# Patient Record
Sex: Female | Born: 1955
Health system: Southern US, Community
[De-identification: ages and names within clinical notes are randomized; demographics above are authoritative.]

## PROBLEM LIST (undated history)

## (undated) DIAGNOSIS — K219 Gastro-esophageal reflux disease without esophagitis: Secondary | ICD-10-CM

## (undated) DIAGNOSIS — E213 Hyperparathyroidism, unspecified: Secondary | ICD-10-CM

## (undated) DIAGNOSIS — R011 Cardiac murmur, unspecified: Secondary | ICD-10-CM

## (undated) DIAGNOSIS — H269 Unspecified cataract: Secondary | ICD-10-CM

## (undated) DIAGNOSIS — E559 Vitamin D deficiency, unspecified: Secondary | ICD-10-CM

## (undated) DIAGNOSIS — D649 Anemia, unspecified: Secondary | ICD-10-CM

## (undated) DIAGNOSIS — E669 Obesity, unspecified: Secondary | ICD-10-CM

## (undated) DIAGNOSIS — I10 Essential (primary) hypertension: Secondary | ICD-10-CM

## (undated) DIAGNOSIS — M1990 Unspecified osteoarthritis, unspecified site: Secondary | ICD-10-CM

## (undated) DIAGNOSIS — D509 Iron deficiency anemia, unspecified: Secondary | ICD-10-CM

## (undated) HISTORY — PX: TONSILLECTOMY: SUR1361

## (undated) HISTORY — DX: Unspecified cataract: H26.9

## (undated) HISTORY — PX: ABDOMINAL HYSTERECTOMY: SHX81

---

## 2000-02-02 ENCOUNTER — Ambulatory Visit (HOSPITAL_COMMUNITY): Admission: RE | Admit: 2000-02-02 | Discharge: 2000-02-02 | Payer: Self-pay | Admitting: Psychiatry

## 2004-12-24 ENCOUNTER — Ambulatory Visit: Payer: Self-pay

## 2005-12-16 ENCOUNTER — Other Ambulatory Visit: Admission: RE | Admit: 2005-12-16 | Discharge: 2005-12-16 | Payer: Self-pay | Admitting: Obstetrics and Gynecology

## 2007-11-10 ENCOUNTER — Encounter: Admission: RE | Admit: 2007-11-10 | Discharge: 2007-11-10 | Payer: Self-pay | Admitting: Obstetrics & Gynecology

## 2007-12-22 ENCOUNTER — Encounter: Admission: RE | Admit: 2007-12-22 | Discharge: 2007-12-22 | Payer: Self-pay | Admitting: Obstetrics & Gynecology

## 2018-11-13 ENCOUNTER — Ambulatory Visit: Payer: Self-pay | Admitting: Family Medicine

## 2018-11-21 DIAGNOSIS — H524 Presbyopia: Secondary | ICD-10-CM | POA: Diagnosis not present

## 2018-12-18 ENCOUNTER — Ambulatory Visit (INDEPENDENT_AMBULATORY_CARE_PROVIDER_SITE_OTHER): Payer: Medicare HMO | Admitting: Family Medicine

## 2018-12-18 ENCOUNTER — Encounter (INDEPENDENT_AMBULATORY_CARE_PROVIDER_SITE_OTHER): Payer: Self-pay

## 2018-12-18 ENCOUNTER — Encounter: Payer: Self-pay | Admitting: Family Medicine

## 2018-12-18 VITALS — BP 128/76 | HR 92 | Temp 98.1°F | Resp 12 | Ht 62.5 in | Wt 209.7 lb

## 2018-12-18 DIAGNOSIS — D649 Anemia, unspecified: Secondary | ICD-10-CM

## 2018-12-18 DIAGNOSIS — E669 Obesity, unspecified: Secondary | ICD-10-CM | POA: Diagnosis not present

## 2018-12-18 DIAGNOSIS — Z1211 Encounter for screening for malignant neoplasm of colon: Secondary | ICD-10-CM | POA: Diagnosis not present

## 2018-12-18 DIAGNOSIS — Z8342 Family history of familial hypercholesterolemia: Secondary | ICD-10-CM | POA: Diagnosis not present

## 2018-12-18 DIAGNOSIS — D509 Iron deficiency anemia, unspecified: Secondary | ICD-10-CM | POA: Diagnosis not present

## 2018-12-18 DIAGNOSIS — Z1239 Encounter for other screening for malignant neoplasm of breast: Secondary | ICD-10-CM

## 2018-12-18 DIAGNOSIS — Z5181 Encounter for therapeutic drug level monitoring: Secondary | ICD-10-CM

## 2018-12-18 DIAGNOSIS — E2839 Other primary ovarian failure: Secondary | ICD-10-CM

## 2018-12-18 DIAGNOSIS — Z818 Family history of other mental and behavioral disorders: Secondary | ICD-10-CM

## 2018-12-18 MED ORDER — ASPIRIN EC 81 MG PO TBEC: 81.0000 mg | DELAYED_RELEASE_TABLET | Freq: Every day | ORAL | Status: DC

## 2018-12-18 NOTE — Assessment & Plan Note (Signed)
Check lipids 

## 2018-12-18 NOTE — Progress Notes (Signed)
BP 128/76   Pulse 92   Temp 98.1 F (36.7 C) (Oral)   Resp 12   Ht 5' 2.5" (1.588 m)   Wt 209 lb 11.2 oz (95.1 kg)   SpO2 96%   BMI 37.74 kg/m    Subjective:    Patient ID: Danielle Le, female    DOB: Oct 03, 1956, 63 y.o.   MRN: [id number]  HPI: Danielle Le is a 63 y.o. female  Chief Complaint  Patient presents with  . Establish Care    HPI Patient is here to establish care  Mother had high cholesterol No recent labs; never took cholesterol medicine; does eat "not so good" Never had issues with blood pressure or elevated sugars Dementia in the family; PGF and father and cousin with dementia; nothing major issues with memory Cologuard requested; no fam hx of colon cancer; no hx of colon polyps personally DEXA ordered, no recent bone densities; gets calcium sources Mammogram ordered, no lumps or bumps; no hx of biopsies Obesity; struggled for ten years; not really tried anything; she says she can do it   Depression screen [facility] 2/9 12/18/2018  Decreased Interest 0  Down, Depressed, Hopeless 0  PHQ - 2 Score 0  Altered sleeping 0  Tired, decreased energy 0  Change in appetite 0  Feeling bad or failure about yourself  0  Trouble concentrating 0  Moving slowly or fidgety/restless 0  Suicidal thoughts 0  PHQ-9 Score 0  Difficult doing work/chores Not difficult at all   Fall Risk  12/18/2018  Falls in the past year? 1  Number falls in past yr: 0  Injury with Fall? 1  MD note: took out the trash wearing flip flops; hurt her left wrist, swelled up and saw walk-in clinic, xrayed; did not break it  Relevant past medical, surgical, family and social history reviewed Past Medical History:  Diagnosis Date  . Cataract    Past Surgical History:  Procedure Laterality Date  . ABDOMINAL HYSTERECTOMY    . CESAREAN SECTION       Family History  Problem Relation Age of Onset  . Hypertension Mother   . Hyperlipidemia Mother   . Dementia Father   . Hypertension Father     . AVM Brother   . Anxiety disorder Son   . Seizures Son   . Dementia Paternal Grandfather   . Hypertension Brother   MD note: brother has a pressure ulcer (sacral), lots of health issues; two years old than her  Social History   Tobacco Use  . Smoking status: Never Smoker  . Smokeless tobacco: Never Used  Substance Use Topics  . Alcohol use: Not Currently  . Drug use: Not Currently     [facility] Visit from 12/18/2018 in [facility]  AUDIT-C Score  0      Interim medical history since last visit reviewed. Allergies and medications reviewed  Review of Systems Per HPI unless specifically indicated above     Objective:    BP 128/76   Pulse 92   Temp 98.1 F (36.7 C) (Oral)   Resp 12   Ht 5' 2.5" (1.588 m)   Wt 209 lb 11.2 oz (95.1 kg)   SpO2 96%   BMI 37.74 kg/m   Wt Readings from Last 3 Encounters:  12/25/18 209 lb 12.8 oz (95.2 kg)  12/18/18 209 lb 11.2 oz (95.1 kg)    Physical Exam Constitutional:      General: She is not in  acute distress.    Appearance: She is well-developed. She is obese. She is not diaphoretic.  HENT:     Head: Normocephalic and atraumatic.  Eyes:     General: No scleral icterus. Neck:     Thyroid: No thyromegaly.  Cardiovascular:     Rate and Rhythm: Normal rate and regular rhythm.     Heart sounds: Normal heart sounds. No murmur.  Pulmonary:     Effort: Pulmonary effort is normal. No respiratory distress.     Breath sounds: Normal breath sounds. No wheezing.  Abdominal:     General: Bowel sounds are normal. There is no distension.     Palpations: Abdomen is soft.  Skin:    General: Skin is warm and dry.     Coloration: Skin is not pale.  Neurological:     Mental Status: She is alert.  Psychiatric:        Behavior: Behavior normal.        Thought Content: Thought content normal.        Judgment: Judgment normal.        Assessment & Plan:   Problem List Items Addressed This Visit      Other    Obesity (BMI 35.0-39.9 without comorbidity) - Primary    Encouraged weight loss; see AVS      Relevant Orders   COMPLETE METABOLIC PANEL WITH GFR (Completed)   Lipid panel (Completed)   TSH (Completed)   Family history of high cholesterol    Check lipids      Family history of dementia    No issues currently       Other Visit Diagnoses    Screening for breast cancer       Relevant Orders   MM 3D SCREEN BREAST BILATERAL   Screen for colon cancer       Relevant Orders   Cologuard   Estrogen deficiency       Relevant Orders   DG Bone Density   Medication monitoring encounter       Relevant Orders   CBC       Follow up plan: Return in about 1 month (around 01/16/2019) for Medicare Wellness visit with Dr. Sanda Klein.  An after-visit summary was printed and given to the patient at [facility].  Please see the patient instructions which may contain other information and recommendations beyond what is mentioned above in the assessment and plan.  Meds ordered this encounter  Medications  . aspirin EC 81 MG tablet    Sig: Take 1 tablet (81 mg total) by mouth daily.    Orders Placed This Encounter  Procedures  . DG Bone Density  . MM 3D SCREEN BREAST BILATERAL  . Cologuard  . COMPLETE METABOLIC PANEL WITH GFR  . Lipid panel  . TSH  . CBC  . CBC  . Pathologist smear review  . TEST AUTHORIZATION  . Iron, TIBC and Ferritin Panel    Face-to-face time with patient was more than 30 minutes, >50% time spent counseling and coordination of care

## 2018-12-18 NOTE — Assessment & Plan Note (Signed)
No issues currently

## 2018-12-18 NOTE — Assessment & Plan Note (Signed)
Encouraged weight loss; see AVS 

## 2018-12-18 NOTE — Patient Instructions (Addendum)
We'll get labs today If you have not heard anything from my staff in a week about any orders/referrals/studies from today, please contact us here to follow-up [phone]  Please do call to schedule your mammogram and bone density scan; the number to schedule one at either [facility] or [facility] is [phone]  Return for a Welcome to Medicare visit with Dr. Sanda Klein  Check out the information at familydoctor.org entitled "Nutrition for Weight Loss: What You Need to Know about Fad Diets" Try to lose between 1-2 pounds per week by taking in fewer calories and burning off more calories You can succeed by limiting portions, limiting foods dense in calories and fat, becoming more active, and drinking 8 glasses of water a day (64 ounces) Don't skip meals, especially breakfast, as skipping meals may alter your metabolism Do not use over-the-counter weight loss pills or gimmicks that claim rapid weight loss A healthy BMI (or body mass index) is between 18.5 and 24.9 You can calculate your ideal BMI at the [place] website ClubMonetize.fr   Obesity, Adult Obesity is the condition of having too much total body fat. Being overweight or obese means that your weight is greater than what is considered healthy for your body size. Obesity is determined by a measurement called BMI. BMI is an estimate of body fat and is calculated from height and weight. For adults, a BMI of 30 or higher is considered obese. Obesity can eventually lead to other health concerns and major illnesses, including:  Stroke.  Coronary artery disease (CAD).  Type 2 diabetes.  Some types of cancer, including cancers of the colon, breast, uterus, and gallbladder.  Osteoarthritis.  High blood pressure (hypertension).  High cholesterol.  Sleep apnea.  Gallbladder stones.  Infertility problems. What are the causes? The main cause of obesity is  taking in (consuming) more calories than your body uses for energy. Other factors that contribute to this condition may include:  Being born with genes that make you more likely to become obese.  Having a medical condition that causes obesity. These conditions include: ? Hypothyroidism. ? Polycystic ovarian syndrome (PCOS). ? Binge-eating disorder. ? Cushing syndrome.  Taking certain medicines, such as steroids, antidepressants, and seizure medicines.  Not being physically active (sedentary lifestyle).  Living where there are limited places to exercise safely or buy healthy foods.  Not getting enough sleep. What increases the risk? The following factors may increase your risk of this condition:  Having a family history of obesity.  Being a woman of African-American descent.  Being a man of Hispanic descent. What are the signs or symptoms? Having excessive body fat is the main symptom of this condition. How is this diagnosed? This condition may be diagnosed based on:  Your symptoms.  Your medical history.  A physical exam. Your health care provider may measure: ? Your BMI. If you are an adult with a BMI between 25 and less than 30, you are considered overweight. If you are an adult with a BMI of 30 or higher, you are considered obese. ? The distances around your hips and your waist (circumferences). These may be compared to each other to help diagnose your condition. ? Your skinfold thickness. Your health care provider may gently pinch a fold of your skin and measure it. How is this treated? Treatment for this condition often includes changing your lifestyle. Treatment may include some or all of the following:  Dietary changes. Work with your health care provider and  a dietitian to set a weight-loss goal that is healthy and reasonable for you. Dietary changes may include eating: ? Smaller portions. A portion size is the amount of a particular food that is healthy for you to  eat at one time. This varies from person to person. ? Low-calorie or low-fat options. ? More whole grains, fruits, and vegetables.  Regular physical activity. This may include aerobic activity (cardio) and strength training.  Medicine to help you lose weight. Your health care provider may prescribe medicine if you are unable to lose 1 pound a week after 6 weeks of eating more healthily and doing more physical activity.  Surgery. Surgical options may include gastric banding and gastric bypass. Surgery may be done if: ? Other treatments have not helped to improve your condition. ? You have a BMI of 40 or higher. ? You have life-threatening health problems related to obesity. Follow these instructions at home:  Eating and drinking   Follow recommendations from your health care provider about what you eat and drink. Your health care provider may advise you to: ? Limit fast foods, sweets, and processed snack foods. ? Choose low-fat options, such as low-fat milk instead of whole milk. ? Eat 5 or more servings of fruits or vegetables every day. ? Eat at home more often. This gives you more control over what you eat. ? Choose healthy foods when you eat out. ? Learn what a healthy portion size is. ? Keep low-fat snacks on hand. ? Avoid sugary drinks, such as soda, fruit juice, iced tea sweetened with sugar, and flavored milk. ? Eat a healthy breakfast.  Drink enough water to keep your urine clear or pale yellow.  Do not go without eating for long periods of time (do not fast) or follow a fad diet. Fasting and fad diets can be unhealthy and even dangerous. Physical Activity  Exercise regularly, as told by your health care provider. Ask your health care provider what types of exercise are safe for you and how often you should exercise.  Warm up and stretch before being active.  Cool down and stretch after being active.  Rest between periods of activity. Lifestyle  Limit the time that  you spend in front of your TV, computer, or video game system.  Find ways to reward yourself that do not involve food.  Limit alcohol intake to no more than 1 drink a day for nonpregnant women and 2 drinks a day for men. One drink equals 12 oz of beer, 5 oz of wine, or 1 oz of hard liquor. General instructions  Keep a weight loss journal to keep track of the food you eat and how much you exercise you get.  Take over-the-counter and prescription medicines only as told by your health care provider.  Take vitamins and supplements only as told by your health care provider.  Consider joining a support group. Your health care provider may be able to recommend a support group.  Keep all follow-up visits as told by your health care provider. This is important. Contact a health care provider if:  You are unable to meet your weight loss goal after 6 weeks of dietary and lifestyle changes. This information is not intended to replace advice given to you by your health care provider. Make sure you discuss any questions you have with your health care provider. Document Released: 11/25/2004 Document Revised: 03/22/2016 Document Reviewed: 08/06/2015 Elsevier Interactive Patient Education  2019 [employer].

## 2018-12-19 ENCOUNTER — Telehealth: Payer: Self-pay | Admitting: Family Medicine

## 2018-12-19 DIAGNOSIS — D509 Iron deficiency anemia, unspecified: Secondary | ICD-10-CM

## 2018-12-19 NOTE — Telephone Encounter (Signed)
Called pt to review labs; left detailed message Anemic; if [facility], [facility], call 911, get to ER right away She may not be symptomatic because this appears chronic and she may not realize if slow insidious onset I'll refer to GI and add on extra labs to blood at [facility] ---------------------------------------- Roselyn Reef, please ADD ON iron studies I already put in referral to GI

## 2018-12-20 ENCOUNTER — Other Ambulatory Visit: Payer: Self-pay

## 2018-12-20 ENCOUNTER — Telehealth: Payer: Self-pay | Admitting: Family Medicine

## 2018-12-20 DIAGNOSIS — D509 Iron deficiency anemia, unspecified: Secondary | ICD-10-CM

## 2018-12-20 NOTE — Telephone Encounter (Signed)
Charted in result notes. 

## 2018-12-20 NOTE — Telephone Encounter (Signed)
Given to lab to add

## 2018-12-20 NOTE — Telephone Encounter (Signed)
Copied from [place] [id number]. Topic: Quick Communication - Lab Results (Clinic Use ONLY) >> Dec 20, 2018  9:14 AM Cathrine Muster, CMA wrote: Called patient to inform them of  lab results. When patient returns call, triage nurse may disclose results. >> Dec 20, 2018  3:39 PM Bea Graff, NT wrote: Pt calling back to receive lab results.

## 2018-12-22 LAB — CBC
HCT: 30 % — ABNORMAL LOW (ref 35.0–45.0)
Hemoglobin: 9 g/dL — ABNORMAL LOW (ref 11.7–15.5)
MCH: 19.3 pg — AB (ref 27.0–33.0)
MCHC: 30 g/dL — AB (ref 32.0–36.0)
MCV: 64.4 fL — AB (ref 80.0–100.0)
MPV: 10.4 fL (ref 7.5–12.5)
PLATELETS: 335 10*3/uL (ref 140–400)
RBC: 4.66 10*6/uL (ref 3.80–5.10)
RDW: 17 % — ABNORMAL HIGH (ref 11.0–15.0)
WBC: 4.2 10*3/uL (ref 3.8–10.8)

## 2018-12-22 LAB — COMPLETE METABOLIC PANEL WITH GFR
AG Ratio: 1.4 (calc) (ref 1.0–2.5)
ALKALINE PHOSPHATASE (APISO): 113 U/L (ref 37–153)
ALT: 15 U/L (ref 6–29)
AST: 19 U/L (ref 10–35)
Albumin: 4.2 g/dL (ref 3.6–5.1)
BUN: 10 mg/dL (ref 7–25)
CALCIUM: 10.4 mg/dL (ref 8.6–10.4)
CO2: 28 mmol/L (ref 20–32)
Chloride: 104 mmol/L (ref 98–110)
Creat: 0.64 mg/dL (ref 0.50–0.99)
GFR, Est African American: 109 mL/min/{1.73_m2} (ref >=60)
GFR, Est Non African American: 94 mL/min/{1.73_m2} (ref >=60)
GLOBULIN: 2.9 g/dL (ref 1.9–3.7)
GLUCOSE: 88 mg/dL (ref 65–99)
Potassium: 4.1 mmol/L (ref 3.5–5.3)
SODIUM: 138 mmol/L (ref 135–146)
Total Bilirubin: 0.4 mg/dL (ref 0.2–1.2)
Total Protein: 7.1 g/dL (ref 6.1–8.1)

## 2018-12-22 LAB — LIPID PANEL
CHOL/HDL RATIO: 5.2 (calc) — AB (ref <5.0)
CHOLESTEROL: 206 mg/dL — AB (ref <200)
HDL: 40 mg/dL — AB (ref >=50)
LDL Cholesterol (Calc): 130 mg/dL (calc) — ABNORMAL HIGH
Non-HDL Cholesterol (Calc): 166 mg/dL (calc) — ABNORMAL HIGH (ref <130)
TRIGLYCERIDES: 215 mg/dL — AB (ref <150)

## 2018-12-22 LAB — TEST AUTHORIZATION

## 2018-12-22 LAB — IRON,TIBC AND FERRITIN PANEL
%SAT: 4 % (calc) — ABNORMAL LOW (ref 16–45)
FERRITIN: 4 ng/mL — AB (ref 16–288)
IRON: 18 ug/dL — AB (ref 45–160)
TIBC: 453 mcg/dL (calc) — ABNORMAL HIGH (ref 250–450)

## 2018-12-22 LAB — PATHOLOGIST SMEAR REVIEW

## 2018-12-22 LAB — TSH: TSH: 1.96 m[IU]/L (ref 0.40–4.50)

## 2018-12-25 ENCOUNTER — Other Ambulatory Visit: Payer: Self-pay

## 2018-12-25 ENCOUNTER — Ambulatory Visit: Payer: Medicare HMO | Admitting: Gastroenterology

## 2018-12-25 ENCOUNTER — Encounter: Payer: Self-pay | Admitting: Gastroenterology

## 2018-12-25 ENCOUNTER — Telehealth: Payer: Self-pay | Admitting: Family Medicine

## 2018-12-25 ENCOUNTER — Encounter

## 2018-12-25 ENCOUNTER — Encounter: Payer: Self-pay | Admitting: Family Medicine

## 2018-12-25 VITALS — BP 152/81 | HR 66 | Resp 16 | Ht 62.5 in | Wt 209.8 lb

## 2018-12-25 DIAGNOSIS — D509 Iron deficiency anemia, unspecified: Secondary | ICD-10-CM

## 2018-12-25 NOTE — Telephone Encounter (Signed)
I called to talk to patient; left msg on cell phone and home phone She saw specialist today Unless told otherwise, stop the aspirin for now while the anemia is being worked up [employer] be available at the office all week if she wants to chat

## 2018-12-25 NOTE — Progress Notes (Signed)
Cephas Darby, MD [address]  [place]  [place], [place] [address]  Main: [phone]  Fax: [phone]    Gastroenterology Consultation  Referring Provider:     Arnetha Courser, MD Primary Care Physician:  Arnetha Courser, MD Primary Gastroenterologist:  Dr. Cephas Darby Reason for Consultation:     Severe iron deficiency anemia        HPI:   Danielle Le is a 63 y.o. female referred by Dr. Sanda Klein, Satira Anis, MD  for consultation & management of severe iron deficiency anemia.  Patient was seen by Dr. Sanda Klein for a new patient appointment and physical on 12/18/2018.  She did not have any GI symptoms at that time.  On routine blood work, she was found to have severe iron deficiency anemia, hemoglobin 9, MCV 64.4, ferritin 4, TIBC 453, low iron 18.  TSH was normal, dyspnea review revealed hypochromic, microcytic anemia and mild lymphocytosis.  Patient reports that she has been feeling tired for last several months and she attributed it to taking care of 51 year old son who is completely dependent on her as a newborn.  She denies change in stool caliber, altered bowel habits, melena, hematochezia.  Patient had a complete total hysterectomy.  She denies gross blood in urine.  NSAIDs: None  Antiplts/Anticoagulants/Anti thrombotics: Aspirin 81  GI Procedures: None She denies family history of GI malignancy Brother with AVM of the spine  Past Medical History:  Diagnosis Date  . Cataract       Current Outpatient Medications:  .  aspirin EC 81 MG tablet, Take 1 tablet (81 mg total) by mouth daily., Disp: , Rfl:     Family History  Problem Relation Age of Onset  . Hypertension Mother   . Hyperlipidemia Mother   . Dementia Father   . Hypertension Father   . AVM Brother   . Anxiety disorder Son   . Seizures Son   . Dementia Paternal Grandfather   . Hypertension Brother      Social History   Tobacco Use  . Smoking status: Never Smoker  . Smokeless tobacco: Never  Used  Substance Use Topics  . Alcohol use: Not Currently  . Drug use: Not Currently    Allergies as of 12/25/2018  . (No Known Allergies)    Review of Systems:    All systems reviewed and negative except where noted in HPI.   Physical Exam:  BP (!) 152/81 (BP Location: Left Arm, Patient Position: Sitting, Cuff Size: Large)   Pulse 66   Resp 16   Ht 5' 2.5" (1.588 m)   Wt 209 lb 12.8 oz (95.2 kg)   BMI 37.76 kg/m  No LMP recorded. Patient has had a hysterectomy.  General:   Alert,  Well-developed, well-nourished, pleasant and cooperative in NAD Head:  Normocephalic and atraumatic. Eyes:  Sclera clear, no icterus.   Conjunctiva pink. Ears:  Normal auditory acuity. Nose:  No deformity, discharge, or lesions. Mouth:  No deformity or lesions,oropharynx pink & moist. Neck:  Supple; no masses or thyromegaly. Lungs:  Respirations even and unlabored.  Clear throughout to auscultation.   No wheezes, crackles, or rhonchi. No acute distress. Heart:  Regular rate and rhythm; no murmurs, clicks, rubs, or gallops. Abdomen:  Normal bowel sounds. Soft, non-tender and non-distended without masses, hepatosplenomegaly or hernias noted.  No guarding or rebound tenderness.   Rectal: Not performed Msk:  Symmetrical without gross deformities. Good, equal movement & strength bilaterally. Pulses:  Normal pulses noted. Extremities:  No clubbing or edema.  No cyanosis. Neurologic:  Alert and oriented x3;  grossly normal neurologically. Skin:  Intact without significant lesions or rashes. No jaundice. Psych:  Alert and cooperative. Normal mood and affect.  Imaging Studies: No abdominal imaging  Assessment and Plan:   Danielle Le is a 63 y.o. pleasant Caucasian female with no past medical history, complete total hysterectomy found to have severe iron deficiency anemia on routine labs during physical.  She does not have signs of active GI bleed  Iron deficiency anemia Recommend EGD and colonoscopy  +/- VCE Start fusion plus, samples provided Check B12 and folate levels   Follow up in 4 weeks   Cephas Darby, MD

## 2018-12-26 LAB — SPECIMEN STATUS REPORT

## 2018-12-26 LAB — B12 AND FOLATE PANEL
FOLATE: 12.4 ng/mL (ref >3.0)
VITAMIN B 12: 1291 pg/mL — AB (ref 232–1245)

## 2019-01-04 ENCOUNTER — Ambulatory Visit
Admission: RE | Admit: 2019-01-04 | Discharge: 2019-01-04 | Disposition: A | Discharge status: Home / Self Care | Payer: Medicare HMO | Attending: Gastroenterology | Admitting: Gastroenterology

## 2019-01-04 ENCOUNTER — Other Ambulatory Visit: Payer: Self-pay

## 2019-01-04 ENCOUNTER — Ambulatory Visit: Payer: Medicare HMO | Admitting: Anesthesiology

## 2019-01-04 ENCOUNTER — Encounter: Payer: Self-pay | Admitting: Student

## 2019-01-04 ENCOUNTER — Encounter
Admission: RE | Disposition: A | Discharge status: Home / Self Care | Payer: Self-pay | Source: Home / Self Care | Attending: Gastroenterology

## 2019-01-04 DIAGNOSIS — K2950 Unspecified chronic gastritis without bleeding: Secondary | ICD-10-CM | POA: Diagnosis not present

## 2019-01-04 DIAGNOSIS — D509 Iron deficiency anemia, unspecified: Secondary | ICD-10-CM | POA: Insufficient documentation

## 2019-01-04 DIAGNOSIS — K2960 Other gastritis without bleeding: Secondary | ICD-10-CM | POA: Diagnosis not present

## 2019-01-04 DIAGNOSIS — Z6837 Body mass index (BMI) 37.0-37.9, adult: Secondary | ICD-10-CM | POA: Insufficient documentation

## 2019-01-04 DIAGNOSIS — E669 Obesity, unspecified: Secondary | ICD-10-CM | POA: Insufficient documentation

## 2019-01-04 DIAGNOSIS — K319 Disease of stomach and duodenum, unspecified: Secondary | ICD-10-CM | POA: Diagnosis not present

## 2019-01-04 DIAGNOSIS — D128 Benign neoplasm of rectum: Secondary | ICD-10-CM | POA: Diagnosis not present

## 2019-01-04 DIAGNOSIS — K3189 Other diseases of stomach and duodenum: Secondary | ICD-10-CM | POA: Diagnosis not present

## 2019-01-04 DIAGNOSIS — K449 Diaphragmatic hernia without obstruction or gangrene: Secondary | ICD-10-CM | POA: Insufficient documentation

## 2019-01-04 DIAGNOSIS — K635 Polyp of colon: Secondary | ICD-10-CM | POA: Diagnosis not present

## 2019-01-04 DIAGNOSIS — D126 Benign neoplasm of colon, unspecified: Secondary | ICD-10-CM | POA: Diagnosis not present

## 2019-01-04 HISTORY — DX: Anemia, unspecified: D64.9

## 2019-01-04 HISTORY — PX: ESOPHAGOGASTRODUODENOSCOPY (EGD) WITH PROPOFOL: SHX5813

## 2019-01-04 HISTORY — PX: COLONOSCOPY WITH PROPOFOL: SHX5780

## 2019-01-04 SURGERY — ESOPHAGOGASTRODUODENOSCOPY (EGD) WITH PROPOFOL
Anesthesia: General

## 2019-01-04 MED ORDER — FENTANYL CITRATE (PF) 100 MCG/2ML IJ SOLN
INTRAMUSCULAR | Status: DC | PRN
  Administered 2019-01-04 (×2): 50 ug via INTRAVENOUS

## 2019-01-04 MED ORDER — FENTANYL CITRATE (PF) 100 MCG/2ML IJ SOLN
INTRAMUSCULAR | Status: AC
  Filled 2019-01-04: qty 2

## 2019-01-04 MED ORDER — LIDOCAINE HCL (CARDIAC) PF 100 MG/5ML IV SOSY
PREFILLED_SYRINGE | INTRAVENOUS | Status: DC | PRN
  Administered 2019-01-04: 80 mg via INTRAVENOUS

## 2019-01-04 MED ORDER — FUSION PLUS PO CAPS: ORAL_CAPSULE | ORAL | 2 refills | Status: DC

## 2019-01-04 MED ORDER — PROPOFOL 10 MG/ML IV BOLUS
INTRAVENOUS | Status: AC
  Filled 2019-01-04: qty 40

## 2019-01-04 MED ORDER — PROPOFOL 500 MG/50ML IV EMUL
INTRAVENOUS | Status: DC | PRN
  Administered 2019-01-04: 100 ug/kg/min via INTRAVENOUS

## 2019-01-04 MED ORDER — SODIUM CHLORIDE 0.9 % IV SOLN
INTRAVENOUS | Status: DC
  Administered 2019-01-04: 1000 mL via INTRAVENOUS

## 2019-01-04 MED ORDER — PROPOFOL 10 MG/ML IV BOLUS
INTRAVENOUS | Status: DC | PRN
  Administered 2019-01-04 (×3): 50 mg via INTRAVENOUS

## 2019-01-04 MED ORDER — LIDOCAINE HCL (PF) 1 % IJ SOLN
INTRAMUSCULAR | Status: AC
  Administered 2019-01-04: 0.3 mL
  Filled 2019-01-04: qty 2

## 2019-01-04 NOTE — Op Note (Signed)
[facility]enterology Patient Name: Danielle Le Procedure Date: 01/04/2019 8:55 AM MRN: [id number] Account #: [id number] Date of Birth: 1956-07-09 Admit Type: Outpatient Age: 63 Room: [facility] ENDO ROOM 2 Gender: Female Note Status: Finalized Procedure:            Colonoscopy Indications:          Unexplained iron deficiency anemia Providers:            Lin Landsman MD, MD Medicines:            Monitored Anesthesia Care Complications:        No immediate complications. Estimated blood loss: None. Procedure:            Pre-Anesthesia Assessment:                       - Prior to the procedure, a History and Physical was                        performed, and patient medications and allergies were                        reviewed. The patient is competent. The risks and                        benefits of the procedure and the sedation options and                        risks were discussed with the patient. All questions                        were answered and informed consent was obtained.                        Patient identification and proposed procedure were                        verified by the physician, the nurse, the                        anesthesiologist, the anesthetist and the technician in                        the pre-procedure area in the procedure room in the                        endoscopy suite. Mental Status Examination: alert and                        oriented. Airway Examination: normal oropharyngeal                        airway and neck mobility. Respiratory Examination:                        clear to auscultation. CV Examination: normal.                        Prophylactic Antibiotics: The patient does not require  prophylactic antibiotics. Prior Anticoagulants: The                        patient has taken no previous anticoagulant or                        antiplatelet agents. ASA Grade Assessment: II - A                     patient with mild systemic disease. After reviewing the                        risks and benefits, the patient was deemed in                        satisfactory condition to undergo the procedure. The                        anesthesia plan was to use monitored anesthesia care                        (MAC). Immediately prior to administration of                        medications, the patient was re-assessed for adequacy                        to receive sedatives. The heart rate, respiratory rate,                        oxygen saturations, blood pressure, adequacy of                        pulmonary ventilation, and response to care were                        monitored throughout the procedure. The physical status                        of the patient was re-assessed after the procedure.                       After obtaining informed consent, the colonoscope was                        passed under direct vision. Throughout the procedure,                        the patient's blood pressure, pulse, and oxygen                        saturations were monitored continuously. The                        Colonoscope was introduced through the anus and                        advanced to the the terminal ileum, with identification                        of the appendiceal  orifice and IC valve. The                        colonoscopy was performed without difficulty. The                        patient tolerated the procedure well. The quality of                        the bowel preparation was evaluated using the BBPS                        [facility] Bowel Preparation Scale) with scores of: Right                        Colon = 3, Transverse Colon = 3 and Left Colon = 3                        (entire mucosa seen well with no residual staining,                        small fragments of stool or opaque liquid). The total                        BBPS score equals 9. Findings:      The  perianal and digital rectal examinations were normal. Pertinent       negatives include normal sphincter tone and no palpable rectal lesions.      The terminal ileum appeared normal examined to 10cm.      The colon (entire examined portion) appeared normal.      A diminutive polyp was found in the rectum. The polyp was sessile,       likely hyperplastic. The polyp was removed with a cold snare. Resection       and retrieval were complete. Impression:           - The examined portion of the ileum was normal.                       - The entire examined colon is normal.                       - One diminutive polyp in the rectum, removed with a                        cold snare. Resected and retrieved. Recommendation:       - Discharge patient to home (with escort).                       - Resume previous diet today.                       - Continue present medications.                       - Await pathology results.                       - Repeat colonoscopy in 10 years for surveillance.                       -  Return to my office as previously scheduled.                       - Continue oral iron                       - VCE if pathology unremarkable Procedure Code(s):    --- Professional ---                       [phone], Colonoscopy, flexible; with removal of tumor(s),                        polyp(s), or other lesion(s) by snare technique Diagnosis Code(s):    --- Professional ---                       K62.1, Rectal polyp                       D50.9, Iron deficiency anemia, unspecified CPT copyright 2018 American Medical Association. All rights reserved. The codes documented in this report are preliminary and upon coder review may  be revised to meet current compliance requirements. Dr. Ulyess Mort Lin Landsman MD, MD 01/04/2019 9:39:20 AM This report has been signed electronically. Number of Addenda: 0 Note Initiated On: 01/04/2019 8:55 AM Scope Withdrawal Time: 0 hours 13 minutes  6 seconds  Total Procedure Duration: 0 hours 14 minutes 40 seconds       [facility]

## 2019-01-04 NOTE — Anesthesia Postprocedure Evaluation (Signed)
Anesthesia Post Note  Patient: Danielle Le  Procedure(s) Performed: ESOPHAGOGASTRODUODENOSCOPY (EGD) WITH PROPOFOL (N/A ) COLONOSCOPY WITH PROPOFOL (N/A )  Patient location during evaluation: Endoscopy Anesthesia Type: General Level of consciousness: awake and alert Pain management: pain level controlled Vital Signs Assessment: post-procedure vital signs reviewed and stable Respiratory status: spontaneous breathing, nonlabored ventilation, respiratory function stable and patient connected to nasal cannula oxygen Cardiovascular status: blood pressure returned to baseline and stable Postop Assessment: no apparent nausea or vomiting Anesthetic complications: no     Last Vitals:  Vitals:   01/04/19 0950 01/04/19 1000  BP: 129/78 (!) 156/99  Pulse: 79 68  Resp: 18 18  Temp:    SpO2: 99% 100%    Last Pain:  Vitals:   01/04/19 1000  TempSrc:   PainSc: 0-No pain                 Martha Clan

## 2019-01-04 NOTE — Anesthesia Post-op Follow-up Note (Signed)
Anesthesia QCDR form completed.        

## 2019-01-04 NOTE — H&P (Signed)
Danielle Darby, MD [address]  [place], [place] [address]  Main: [phone]  Fax: [phone] Pager: [phone]  Primary Care Physician:  Danielle Courser, MD Primary Gastroenterologist:  Dr. Cephas Le  Pre-Procedure History & Physical: HPI:  Danielle Le is a 63 y.o. female is here for an endoscopy and colonoscopy.   Past Medical History:  Diagnosis Date  . Anemia   . Cataract     Past Surgical History:  Procedure Laterality Date  . ABDOMINAL HYSTERECTOMY    . CESAREAN SECTION      Prior to Admission medications   Not on File    Allergies as of 12/26/2018  . (No Known Allergies)    Family History  Problem Relation Age of Onset  . Hypertension Mother   . Hyperlipidemia Mother   . Dementia Father   . Hypertension Father   . AVM Brother   . Anxiety disorder Son   . Seizures Son   . Dementia Paternal Grandfather   . Hypertension Brother     Social History   Socioeconomic History  . Marital status: Divorced    Spouse name: Not on file  . Number of children: 1  . Years of education: 47  . Highest education level: Associate degree: occupational, Hotel manager, or vocational program  Occupational History  . Occupation: stay home mom  Social Needs  . Financial resource strain: Not hard at all  . Food insecurity:    Worry: Never true    Inability: Never true  . Transportation needs:    Medical: No    Non-medical: No  Tobacco Use  . Smoking status: Never Smoker  . Smokeless tobacco: Never Used  Substance and Sexual Activity  . Alcohol use: Not Currently  . Drug use: Not Currently  . Sexual activity: Not Currently  Lifestyle  . Physical activity:    Days per week: 0 days    Minutes per session: 0 min  . Stress: Very much  Relationships  . Social connections:    Talks on phone: More than three times a week    Gets together: More than three times a week    Attends religious service: Never    Active member of club or  organization: No    Attends meetings of clubs or organizations: Never    Relationship status: Divorced  . Intimate partner violence:    Fear of current or ex partner: No    Emotionally abused: No    Physically abused: No    Forced sexual activity: No  Other Topics Concern  . Not on file  Social History Narrative  . Not on file    Review of Systems: See HPI, otherwise negative ROS  Physical Exam: BP (!) 145/83   Pulse 69   Temp (!) 96.6 F (35.9 C) (Tympanic)   Resp 16   Ht 5\' 2"  (1.575 m)   Wt 93.4 kg   SpO2 96%   BMI 37.68 kg/m  General:   Alert,  pleasant and cooperative in NAD Head:  Normocephalic and atraumatic. Neck:  Supple; no masses or thyromegaly. Lungs:  Clear throughout to auscultation.    Heart:  Regular rate and rhythm. Abdomen:  Soft, nontender and nondistended. Normal bowel sounds, without guarding, and without rebound.   Neurologic:  Alert and  oriented x4;  grossly normal neurologically.  Impression/Plan: Danielle Le is here for an endoscopy and colonoscopy to be performed for IDA  Risks, benefits, limitations, and  alternatives regarding  endoscopy and colonoscopy have been reviewed with the patient.  Questions have been answered.  All parties agreeable.   Sherri Sear, MD  01/04/2019, 8:56 AM

## 2019-01-04 NOTE — Transfer of Care (Signed)
Immediate Anesthesia Transfer of Care Note  Patient: Danielle Le  Procedure(s) Performed: ESOPHAGOGASTRODUODENOSCOPY (EGD) WITH PROPOFOL (N/A ) COLONOSCOPY WITH PROPOFOL (N/A )  Patient Location: PACU  Anesthesia Type:General  Level of Consciousness: sedated  Airway & Oxygen Therapy: Patient Spontanous Breathing  Post-op Assessment: Report given to RN and Post -op Vital signs reviewed and stable  Post vital signs: Reviewed and stable  Last Vitals:  Vitals Value Taken Time  BP    Temp    Pulse    Resp    SpO2      Last Pain:  Vitals:   01/04/19 0832  TempSrc: Tympanic  PainSc: 0-No pain         Complications: No apparent anesthesia complications

## 2019-01-04 NOTE — Anesthesia Preprocedure Evaluation (Signed)
Anesthesia Evaluation  Patient identified by MRN, date of birth, ID band Patient awake    Reviewed: Allergy & Precautions, H&P , NPO status , Patient's Chart, lab work & pertinent test results, reviewed documented beta blocker date and time   History of Anesthesia Complications Negative for: history of anesthetic complications  Airway Mallampati: III  TM Distance: >3 FB Neck ROM: full    Dental  (+) Dental Advidsory Given, Caps, Teeth Intact   Pulmonary neg pulmonary ROS,           Cardiovascular Exercise Tolerance: Good negative cardio ROS       Neuro/Psych negative neurological ROS  negative psych ROS   GI/Hepatic Neg liver ROS, GERD  ,  Endo/Other  negative endocrine ROS  Renal/GU negative Renal ROS  negative genitourinary   Musculoskeletal   Abdominal   Peds  Hematology  (+) Blood dyscrasia, anemia ,   Anesthesia Other Findings Past Medical History: No date: Anemia No date: Cataract  Obesity  Reproductive/Obstetrics negative OB ROS                             Anesthesia Physical Anesthesia Plan  ASA: II  Anesthesia Plan: General   Post-op Pain Management:    Induction: Intravenous  PONV Risk Score and Plan: 3 and Propofol infusion and TIVA  Airway Management Planned: Natural Airway and Nasal Cannula  Additional Equipment:   Intra-op Plan:   Post-operative Plan:   Informed Consent: I have reviewed the patients History and Physical, chart, labs and discussed the procedure including the risks, benefits and alternatives for the proposed anesthesia with the patient or authorized representative who has indicated his/her understanding and acceptance.     Dental Advisory Given  Plan Discussed with: Anesthesiologist, CRNA and Surgeon  Anesthesia Plan Comments:         Anesthesia Quick Evaluation

## 2019-01-04 NOTE — Op Note (Signed)
[facility] Patient Name: Danielle Le Procedure Date: 01/04/2019 8:55 AM MRN: [id number] Account #: [id number] Date of Birth: 1955-12-08 Admit Type: Outpatient Age: 63 Room: [facility] ENDO ROOM 2 Gender: Female Note Status: Finalized Procedure:            Upper GI endoscopy Indications:          Unexplained iron deficiency anemia Providers:            Lin Landsman MD, MD Referring MD:         Arnetha Courser (Referring MD) Medicines:            Monitored Anesthesia Care Complications:        No immediate complications. Estimated blood loss: None. Procedure:            Pre-Anesthesia Assessment:                       - Prior to the procedure, a History and Physical was                        performed, and patient medications and allergies were                        reviewed. The patient is competent. The risks and                        benefits of the procedure and the sedation options and                        risks were discussed with the patient. All questions                        were answered and informed consent was obtained.                        Patient identification and proposed procedure were                        verified by the physician, the nurse, the                        anesthesiologist, the anesthetist and the technician in                        the pre-procedure area in the procedure room in the                        endoscopy suite. Mental Status Examination: alert and                        oriented. Airway Examination: normal oropharyngeal                        airway and neck mobility. Respiratory Examination:                        clear to auscultation. CV Examination: normal.                        Prophylactic Antibiotics: The patient does not require  prophylactic antibiotics. Prior Anticoagulants: The                        patient has taken no previous anticoagulant or        antiplatelet agents. ASA Grade Assessment: II - A                        patient with mild systemic disease. After reviewing the                        risks and benefits, the patient was deemed in                        satisfactory condition to undergo the procedure. The                        anesthesia plan was to use monitored anesthesia care                        (MAC). Immediately prior to administration of                        medications, the patient was re-assessed for adequacy                        to receive sedatives. The heart rate, respiratory rate,                        oxygen saturations, blood pressure, adequacy of                        pulmonary ventilation, and response to care were                        monitored throughout the procedure. The physical status                        of the patient was re-assessed after the procedure.                       After obtaining informed consent, the endoscope was                        passed under direct vision. Throughout the procedure,                        the patient's blood pressure, pulse, and oxygen                        saturations were monitored continuously. The Endoscope                        was introduced through the mouth, and advanced to the                        second part of duodenum. The upper GI endoscopy was                        accomplished without difficulty. The patient tolerated  the procedure well. Findings:      The duodenal bulb and second portion of the duodenum were normal.       Biopsies for histology were taken with a cold forceps for evaluation of       celiac disease.      Diffuse mildly erythematous mucosa without bleeding was found in the       gastric body and in the gastric antrum. Biopsies were taken with a cold       forceps for Helicobacter pylori testing.      A small hiatal hernia was present.      The cardia and gastric fundus were normal on  retroflexion.      The gastroesophageal junction and examined esophagus were normal. Impression:           - Normal duodenal bulb and second portion of the                        duodenum. Biopsied.                       - Erythematous mucosa in the gastric body and antrum.                        Biopsied.                       - Small hiatal hernia.                       - Normal gastroesophageal junction and esophagus. Recommendation:       - Await pathology results.                       - Proceed with colonoscopy as scheduled                       - See colonoscopy report Procedure Code(s):    --- Professional ---                       [phone], Esophagogastroduodenoscopy, flexible, transoral;                        with biopsy, single or multiple Diagnosis Code(s):    --- Professional ---                       K31.89, Other diseases of stomach and duodenum                       K44.9, Diaphragmatic hernia without obstruction or                        gangrene                       D50.9, Iron deficiency anemia, unspecified CPT copyright 2018 American Medical Association. All rights reserved. The codes documented in this report are preliminary and upon coder review may  be revised to meet current compliance requirements. Dr. Ulyess Mort Lin Landsman MD, MD 01/04/2019 9:18:46 AM This report has been signed electronically. Number of Addenda: 0 Note Initiated On: 01/04/2019 8:55 AM      [facility]

## 2019-01-05 ENCOUNTER — Ambulatory Visit (INDEPENDENT_AMBULATORY_CARE_PROVIDER_SITE_OTHER): Payer: Medicare HMO | Admitting: Family Medicine

## 2019-01-05 ENCOUNTER — Encounter: Payer: Self-pay | Admitting: Family Medicine

## 2019-01-05 VITALS — BP 122/74 | HR 74 | Temp 98.3°F | Resp 12 | Ht 63.0 in | Wt 207.6 lb

## 2019-01-05 DIAGNOSIS — E7800 Pure hypercholesterolemia, unspecified: Secondary | ICD-10-CM | POA: Diagnosis not present

## 2019-01-05 DIAGNOSIS — D509 Iron deficiency anemia, unspecified: Secondary | ICD-10-CM

## 2019-01-05 DIAGNOSIS — R011 Cardiac murmur, unspecified: Secondary | ICD-10-CM | POA: Diagnosis not present

## 2019-01-05 DIAGNOSIS — E669 Obesity, unspecified: Secondary | ICD-10-CM | POA: Diagnosis not present

## 2019-01-05 DIAGNOSIS — E785 Hyperlipidemia, unspecified: Secondary | ICD-10-CM | POA: Insufficient documentation

## 2019-01-05 LAB — CBC
HCT: 32 % — ABNORMAL LOW (ref 35.0–45.0)
Hemoglobin: 9.4 g/dL — ABNORMAL LOW (ref 11.7–15.5)
MCH: 19.6 pg — ABNORMAL LOW (ref 27.0–33.0)
MCHC: 29.4 g/dL — ABNORMAL LOW (ref 32.0–36.0)
MCV: 66.7 fL — AB (ref 80.0–100.0)
MPV: 10.4 fL (ref 7.5–12.5)
Platelets: 335 10*3/uL (ref 140–400)
RBC: 4.8 10*6/uL (ref 3.80–5.10)
RDW: 18.5 % — ABNORMAL HIGH (ref 11.0–15.0)
WBC: 4.9 10*3/uL (ref 3.8–10.8)

## 2019-01-05 NOTE — Assessment & Plan Note (Signed)
She completely colonoscopy and EGD; seeing GI; I communicated with GI specialist, asked if capsule endoscopy or heme referral appropriate; she responded to continue iron therapy at this time since no evidence of active bleeding; recheck CBC in 4 weeks; patient will stay off of aspirin; CBC checked today

## 2019-01-05 NOTE — Patient Instructions (Addendum)
Stop the turmeric for now We'll contact you with the lab results from today Return in 4 weeks for recheck CBC on April 3rd You don't need an appointment. Just come by between the hours of 8:00 am and noon or between 2:00 pm and 4:00 pm Monday through Friday. We'll get an echocardiogram If you have not heard anything from my staff in a week about any orders/referrals/studies from today, please contact us here to follow-up [phone]

## 2019-01-05 NOTE — Assessment & Plan Note (Signed)
Calculated her 10 year ASCVD risk, discussed; between 5 and 7.5%; offered statin; she declined and want to try weight loss and walking and diet first; we can recheck her lipids in 3 months after TLC; healthy eating encouraged, leave foods that originated from cows and pigs off her her plate

## 2019-01-05 NOTE — Assessment & Plan Note (Signed)
Suspect either related to the anemia, sounds benign, but will get echo to evaluate

## 2019-01-05 NOTE — Assessment & Plan Note (Signed)
Encouragement given for weight loss; she will try to walk some more; I encouraged her to think about what her ideal weight would be and envision what hurdles are in her way; then come up with solutions to get over those hurdles

## 2019-01-05 NOTE — Progress Notes (Signed)
BP 122/74   Pulse 74   Temp 98.3 F (36.8 C) (Oral)   Resp 12   Ht 5\' 3"  (1.6 m)   Wt 207 lb 9.6 oz (94.2 kg)   SpO2 95%   BMI 36.77 kg/m    Subjective:    Patient ID: Danielle Le, female    DOB: [birth year], 63 y.o.   MRN: [id number]  HPI: Danielle Le is a 63 y.o. female  Chief Complaint  Patient presents with  . Follow-up    lab review    HPI Patient presents for lab review- CBC on 2/17 showed H/H 9/30 microcytic, hypochromic anemia- she had low iron, ferritin, mildly elevated TIBC. B12 levels mildly elevated. Patient was started on iron supplement from GI and recommended endoscopy and colonoscopy. Completed yesterday by Dr. Marius Ditch pending results.  She loves a spinach salad Taking turmeric and wonders if an issues; we reviewed Not seeing any dark stool No blood in the urine Energy level is pretty good  High cholesterol Additionally labs showed lipids above goal and HDL below goal of 50.  Mother had a stroke in her 39s Father had a coronary artery stent Typical American diet; doesn't like vegetables; doesn't like cooked vegetables Kind of a junk food junkie; trying to get better Not doing breakfast drive thru for the most part never out early enough; loves hamburgers and cheeseburgers So so with fiber and whole grains; trying to read cereal boxes now looking for whole grains Drinking skim milk Very little walking; plans to get more involved and active Obesity; we talked about having her think of what is keeping her from losing weight No hx of cardiac murmur; no fam hx of cardiac murmur  Lab Results  Component Value Date   CHOL 206 (H) 12/18/2018   HDL 40 (L) 12/18/2018   LDLCALC 130 (H) 12/18/2018   TRIG 215 (H) 12/18/2018   CHOLHDL 5.2 (H) 12/18/2018   The 10-year ASCVD risk score Mikey Bussing DC Jr., et al., 2013) is: 5.9%   Values used to calculate the score:     Age: 76 years     Sex: Female     Is Non-Hispanic African American: No     Diabetic: No  Tobacco smoker: No     Systolic Blood Pressure: 409 mmHg     Is BP treated: No     HDL Cholesterol: 40 mg/dL     Total Cholesterol: 206 mg/dL   Depression screen [facility] 2/9 12/18/2018  Decreased Interest 0  Down, Depressed, Hopeless 0  PHQ - 2 Score 0  Altered sleeping 0  Tired, decreased energy 0  Change in appetite 0  Feeling bad or failure about yourself  0  Trouble concentrating 0  Moving slowly or fidgety/restless 0  Suicidal thoughts 0  PHQ-9 Score 0  Difficult doing work/chores Not difficult at all   Fall Risk  12/18/2018  Falls in the past year? 1  Number falls in past yr: 0  Injury with Fall? 1    Relevant past medical, surgical, family and social history reviewed Past Medical History:  Diagnosis Date  . Anemia   . Cataract    Past Surgical History:  Procedure Laterality Date  . ABDOMINAL HYSTERECTOMY    . CESAREAN SECTION    . COLONOSCOPY WITH PROPOFOL N/A 01/04/2019   Procedure: COLONOSCOPY WITH PROPOFOL;  Surgeon: Lin Landsman, MD;  Location[facility] ENDOSCOPY;  Service: Gastroenterology;  Laterality: N/A;  . ESOPHAGOGASTRODUODENOSCOPY (EGD) WITH PROPOFOL N/A 01/04/2019  Procedure: ESOPHAGOGASTRODUODENOSCOPY (EGD) WITH PROPOFOL;  Surgeon: Lin Landsman, MD;  Location: [facility] ENDOSCOPY;  Service: Gastroenterology;  Laterality: N/A;   Family History  Problem Relation Age of Onset  . Hypertension Mother   . Hyperlipidemia Mother   . Dementia Father   . Hypertension Father   . AVM Brother   . Anxiety disorder Son   . Seizures Son   . Dementia Paternal Grandfather   . Hypertension Brother    Social History   Tobacco Use  . Smoking status: Never Smoker  . Smokeless tobacco: Never Used  Substance Use Topics  . Alcohol use: Not Currently  . Drug use: Not Currently     Office Visit from 12/18/2018 in [facility]  AUDIT-C Score  0      Interim medical history since last visit reviewed. Allergies and medications  reviewed  Review of Systems Per HPI unless specifically indicated above     Objective:    BP 122/74   Pulse 74   Temp 98.3 F (36.8 C) (Oral)   Resp 12   Ht 5\' 3"  (1.6 m)   Wt 207 lb 9.6 oz (94.2 kg)   SpO2 95%   BMI 36.77 kg/m   Wt Readings from Last 3 Encounters:  01/05/19 207 lb 9.6 oz (94.2 kg)  01/04/19 206 lb (93.4 kg)  12/25/18 209 lb 12.8 oz (95.2 kg)    Physical Exam Constitutional:      General: She is not in acute distress.    Appearance: She is well-developed. She is obese. She is not diaphoretic.  HENT:     Head: Normocephalic and atraumatic.     Mouth/Throat:     Mouth: Mucous membranes are moist.  Eyes:     General: No scleral icterus. Neck:     Thyroid: No thyromegaly.  Cardiovascular:     Rate and Rhythm: Normal rate and regular rhythm.     Heart sounds: Murmur present. Systolic murmur present with a grade of 2/6.  Pulmonary:     Effort: Pulmonary effort is normal. No respiratory distress.     Breath sounds: Normal breath sounds. No wheezing.  Abdominal:     General: Bowel sounds are normal. There is no distension.     Palpations: Abdomen is soft.  Skin:    General: Skin is warm and dry.     Coloration: Skin is not pale.     Comments: Fair complected but not incredibly pale  Neurological:     Mental Status: She is alert.  Psychiatric:        Behavior: Behavior normal.        Thought Content: Thought content normal.        Judgment: Judgment normal.     Results for orders placed or performed in visit on 12/25/18  B12 and Folate Panel  Result Value Ref Range   Vitamin B-12 1,291 (H) 232 - 1,245 pg/mL   Folate 12.4 >3.0 ng/mL  Specimen status report  Result Value Ref Range   specimen status report Comment       Assessment & Plan:   Problem List Items Addressed This Visit      Other   Obesity (BMI 35.0-39.9 without comorbidity)    Encouragement given for weight loss; she will try to walk some more; I encouraged her to think about  what her ideal weight would be and envision what hurdles are in her way; then come up with solutions to get over those hurdles  Iron deficiency anemia - Primary    She completely colonoscopy and EGD; seeing GI; I communicated with GI specialist, asked if capsule endoscopy or heme referral appropriate; she responded to continue iron therapy at this time since no evidence of active bleeding; recheck CBC in 4 weeks; patient will stay off of aspirin; CBC checked today      Relevant Orders   CBC   CBC   Hypercholesteremia    Calculated her 10 year ASCVD risk, discussed; between 5 and 7.5%; offered statin; she declined and want to try weight loss and walking and diet first; we can recheck her lipids in 3 months after TLC; healthy eating encouraged, leave foods that originated from cows and pigs off her her plate      Cardiac murmur    Suspect either related to the anemia, sounds benign, but will get echo to evaluate      Relevant Orders   ECHOCARDIOGRAM COMPLETE       Follow up plan: No follow-ups on file.  An after-visit summary was printed and given to the patient at [place].  Please see the patient instructions which may contain other information and recommendations beyond what is mentioned above in the assessment and plan.  No orders of the defined types were placed in this encounter.   Orders Placed This Encounter  Procedures  . CBC  . CBC  . ECHOCARDIOGRAM COMPLETE

## 2019-01-08 ENCOUNTER — Encounter: Payer: Self-pay | Admitting: Gastroenterology

## 2019-01-08 LAB — SURGICAL PATHOLOGY

## 2019-01-15 ENCOUNTER — Ambulatory Visit: Payer: Self-pay | Admitting: Gastroenterology

## 2019-01-18 ENCOUNTER — Ambulatory Visit: Payer: Medicare HMO | Admitting: Family Medicine

## 2019-01-26 ENCOUNTER — Telehealth: Payer: Self-pay | Admitting: Gastroenterology

## 2019-01-26 ENCOUNTER — Other Ambulatory Visit: Payer: Self-pay

## 2019-01-26 ENCOUNTER — Ambulatory Visit (INDEPENDENT_AMBULATORY_CARE_PROVIDER_SITE_OTHER): Payer: Medicare HMO | Admitting: Gastroenterology

## 2019-01-26 DIAGNOSIS — D509 Iron deficiency anemia, unspecified: Secondary | ICD-10-CM

## 2019-01-26 NOTE — Progress Notes (Signed)
Cephas Darby, MD [address]  [place]  [place], [place] [address]  Main: [phone]  Fax: [phone]    Gastroenterology follow up Virtual/Tele Visit  Referring Provider:     Arnetha Courser, MD Primary Care Physician:  Arnetha Courser, MD Primary Gastroenterologist:  Dr. Cephas Darby Reason for Consultation:     IDA        HPI:   Danielle Le is a 63 y.o. female referred by Dr. Arnetha Courser, MD  for consultation & management of IDA  Virtual Visit via Telephone Note  I connected with Danielle Le on 01/26/19 at  9:30 AM EDT by telephone and verified that I am speaking with the correct person using two identifiers.   I discussed the limitations, risks, security and privacy concerns of performing an evaluation and management service by telephone and the availability of in person appointments. I also discussed with the patient that there may be a patient responsible charge related to this service. The patient expressed understanding and agreed to proceed.  Location of the Patient: Home  Location of the provider: Home   History of Present Illness: Since last visit, pt underwent EGD and colonoscopy which did not reveal source of IDA. Biopsies were unremarkable for H Pylori infection, celiac disease. She is taking fusion plus daily, feels that her energy levels are better   NSAIDs: none  Antiplts/Anticoagulants/Anti thrombotics: none  GI Procedures: EGD and colonoscopy 01/04/19 Normal duodenal bulb and second portion of the duodenum. Biopsied. - Erythematous mucosa in the gastric body and antrum. Biopsied. - Small hiatal hernia. - Normal gastroesophageal junction and esophagus.  - The examined portion of the ileum was normal. - The entire examined colon is normal. - One diminutive polyp in the rectum, removed with a cold snare. Resected and retrieved.  DIAGNOSIS:  A. DUODENUM; COLD BIOPSY:  - ENTERIC MUCOSA WITH PRESERVED VILLOUS ARCHITECTURE AND NO  SIGNIFICANT  HISTOPATHOLOGIC CHANGE.  - NEGATIVE FOR FEATURES OF CELIAC, DYSPLASIA, AND MALIGNANCY.   B. STOMACH, RANDOM; COLD BIOPSY:  - GASTRIC ANTRAL AND OXYNTIC MUCOSA DISPLAYING MILD CHRONIC INACTIVE  GASTRITIS AND FEATURES OF REACTIVE GASTROPATHY.  - NEGATIVE FOR H. PYLORI, DYSPLASIA, AND MALIGNANCY.   Comment:  An immunohistochemical study for H. pylori, with an appropriately  reactive control, is negative.   C. RECTAL POLYP; COLD SNARE:  - TUBULAR ADENOMA, INFLAMED.  - NEGATIVE FOR HIGH-GRADE DYSPLASIA AND MALIGNANCY  Past Medical History:  Diagnosis Date  . Anemia   . Cataract     Past Surgical History:  Procedure Laterality Date  . ABDOMINAL HYSTERECTOMY    . CESAREAN SECTION    . COLONOSCOPY WITH PROPOFOL N/A 01/04/2019   Procedure: COLONOSCOPY WITH PROPOFOL;  Surgeon: Lin Landsman, MD;  Location[facility] ENDOSCOPY;  Service: Gastroenterology;  Laterality: N/A;  . ESOPHAGOGASTRODUODENOSCOPY (EGD) WITH PROPOFOL N/A 01/04/2019   Procedure: ESOPHAGOGASTRODUODENOSCOPY (EGD) WITH PROPOFOL;  Surgeon: Lin Landsman, MD;  Location: [facility] ENDOSCOPY;  Service: Gastroenterology;  Laterality: N/A;     Current Outpatient Medications:  .  Iron-FA-B Cmp-C-Biot-Probiotic (FUSION PLUS) CAPS, Take 1 pill daily, Disp: 30 capsule, Rfl: 2  Family History  Problem Relation Age of Onset  . Hypertension Mother   . Hyperlipidemia Mother   . Dementia Father   . Hypertension Father   . AVM Brother   . Anxiety disorder Son   . Seizures Son   . Dementia Paternal Grandfather   . Hypertension Brother  Social History   Tobacco Use  . Smoking status: Never Smoker  . Smokeless tobacco: Never Used  Substance Use Topics  . Alcohol use: Not Currently  . Drug use: Not Currently    Allergies as of 01/26/2019  . (No Known Allergies)     Imaging Studies: none  Assessment and Plan:   Danielle Le is a 63 y.o. white female with no PMH has new diagnosis of IDA of unclear  etiology. EGD and colonoscopy unremarkable. Currently on fusion plus She wants to know if she needs further testing to figure out the etiology. I adviced her to recheck labs in 1 month to see if she is responding to iron replacement. If not, will refer her for VCE. It may be just that her diet was devoid of iron after knowing her eating habits.   Follow Up Instructions:   I discussed the assessment and treatment plan with the patient. The patient was provided an opportunity to ask questions and all were answered. The patient agreed with the plan and demonstrated an understanding of the instructions.   The patient was advised to call back or seek an in-person evaluation if the symptoms worsen or if the condition fails to improve as anticipated.  I provided 15 minutes of non-face-to-face time during this encounter.   Follow up in 2-3 months   Cephas Darby, MD

## 2019-01-26 NOTE — Telephone Encounter (Signed)
Left vm for pt to call office an schedule 2 month f/u with Dr. Marius Ditch

## 2019-01-29 ENCOUNTER — Ambulatory Visit: Payer: Medicare HMO | Admitting: Gastroenterology

## 2019-02-02 ENCOUNTER — Ambulatory Visit: Payer: Medicare HMO | Admitting: Family Medicine

## 2019-02-19 ENCOUNTER — Ambulatory Visit: Payer: Self-pay | Admitting: Family Medicine

## 2019-04-19 ENCOUNTER — Ambulatory Visit: Payer: Medicare HMO | Admitting: Gastroenterology

## 2019-04-19 DIAGNOSIS — H2513 Age-related nuclear cataract, bilateral: Secondary | ICD-10-CM | POA: Diagnosis not present

## 2019-04-23 ENCOUNTER — Ambulatory Visit: Payer: Medicare HMO | Admitting: Gastroenterology

## 2019-04-23 ENCOUNTER — Other Ambulatory Visit: Payer: Self-pay

## 2019-04-23 ENCOUNTER — Encounter: Payer: Self-pay | Admitting: Gastroenterology

## 2019-04-23 VITALS — BP 145/75 | HR 77 | Temp 98.4°F

## 2019-04-23 DIAGNOSIS — D509 Iron deficiency anemia, unspecified: Secondary | ICD-10-CM

## 2019-04-23 NOTE — Progress Notes (Signed)
Cephas Darby, MD [address]  [place]  [place], [place] [address]  Main: [phone]  Fax: [phone]    Gastroenterology Consultation  Referring Provider:     Arnetha Courser, MD Primary Care Physician:  Arnetha Courser, MD Primary Gastroenterologist:  Dr. Cephas Darby Reason for Consultation:     Severe iron deficiency anemia        HPI:   Danielle Le is a 63 y.o. female referred by Dr. Sanda Klein, Satira Anis, MD  for consultation & management of severe iron deficiency anemia.  Patient was seen by Dr. Sanda Klein for a new patient appointment and physical on 12/18/2018.  She did not have any GI symptoms at that time.  On routine blood work, she was found to have severe iron deficiency anemia, hemoglobin 9, MCV 64.4, ferritin 4, TIBC 453, low iron 18.  TSH was normal, dyspnea review revealed hypochromic, microcytic anemia and mild lymphocytosis.  Patient reports that she has been feeling tired for last several months and she attributed it to taking care of 36 year old son who is completely dependent on her as a newborn.  She denies change in stool caliber, altered bowel habits, melena, hematochezia.  Patient had a complete total hysterectomy.  She denies gross blood in urine.  Follow-up visit 01/26/2019 Since last visit, pt underwent EGD and colonoscopy which did not reveal source of IDA. Biopsies were unremarkable for H Pylori infection, celiac disease. She is taking fusion plus daily, feels that her energy levels are better  Follow-up visit 04/23/2019 She reports that the fusion plus was causing her diarrhea.  She tried to take it most of the days.  Otherwise, she denies any GI symptoms today  NSAIDs: None  Antiplts/Anticoagulants/Anti thrombotics: Aspirin 81  GI Procedures:  EGD and colonoscopy 01/04/19 Normal duodenal bulb and second portion of the duodenum. Biopsied. - Erythematous mucosa in the gastric body and antrum. Biopsied. - Small hiatal hernia. - Normal  gastroesophageal junction and esophagus.  - The examined portion of the ileum was normal. - The entire examined colon is normal. - One diminutive polyp in the rectum, removed with a cold snare. Resected and retrieved.  DIAGNOSIS:  A. DUODENUM; COLD BIOPSY:  - ENTERIC MUCOSA WITH PRESERVED VILLOUS ARCHITECTURE AND NO SIGNIFICANT  HISTOPATHOLOGIC CHANGE.  - NEGATIVE FOR FEATURES OF CELIAC, DYSPLASIA, AND MALIGNANCY.   B. STOMACH, RANDOM; COLD BIOPSY:  - GASTRIC ANTRAL AND OXYNTIC MUCOSA DISPLAYING MILD CHRONIC INACTIVE  GASTRITIS AND FEATURES OF REACTIVE GASTROPATHY.  - NEGATIVE FOR H. PYLORI, DYSPLASIA, AND MALIGNANCY.   Comment:  An immunohistochemical study for H. pylori, with an appropriately  reactive control, is negative.   C. RECTAL POLYP; COLD SNARE:  - TUBULAR ADENOMA, INFLAMED.  - NEGATIVE FOR HIGH-GRADE DYSPLASIA AND MALIGNANCY  She denies family history of GI malignancy Brother with AVM of the spine  Past Medical History:  Diagnosis Date  . Anemia   . Cataract     Current Outpatient Medications:  .  Iron-FA-B Cmp-C-Biot-Probiotic (FUSION PLUS) CAPS, Take 1 pill daily (Patient not taking: Reported on 04/23/2019), Disp: 30 capsule, Rfl: 2  Family History  Problem Relation Age of Onset  . Hypertension Mother   . Hyperlipidemia Mother   . Dementia Father   . Hypertension Father   . AVM Brother   . Anxiety disorder Son   . Seizures Son   . Dementia Paternal Grandfather   . Hypertension Brother      Social History   Tobacco  Use  . Smoking status: Never Smoker  . Smokeless tobacco: Never Used  Substance Use Topics  . Alcohol use: Not Currently  . Drug use: Not Currently    Allergies as of 04/23/2019  . (No Known Allergies)    Review of Systems:    All systems reviewed and negative except where noted in HPI.   Physical Exam:  BP (!) 145/75   Pulse 77   Temp 98.4 F (36.9 C)  No LMP recorded. Patient has had a hysterectomy.  General:    Alert,  Well-developed, well-nourished, pleasant and cooperative in NAD Head:  Normocephalic and atraumatic. Eyes:  Sclera clear, no icterus.   Conjunctiva pink. Ears:  Normal auditory acuity. Nose:  No deformity, discharge, or lesions. Mouth:  No deformity or lesions,oropharynx pink & moist. Neck:  Supple; no masses or thyromegaly. Lungs:  Respirations even and unlabored.  Clear throughout to auscultation.   No wheezes, crackles, or rhonchi. No acute distress. Heart:  Regular rate and rhythm; no murmurs, clicks, rubs, or gallops. Abdomen:  Normal bowel sounds. Soft, non-tender and non-distended without masses, hepatosplenomegaly or hernias noted.  No guarding or rebound tenderness.   Rectal: Not performed Msk:  Symmetrical without gross deformities. Good, equal movement & strength bilaterally. Pulses:  Normal pulses noted. Extremities:  No clubbing or edema.  No cyanosis. Neurologic:  Alert and oriented x3;  grossly normal neurologically. Skin:  Intact without significant lesions or rashes. No jaundice. Psych:  Alert and cooperative. Normal mood and affect.  Imaging Studies: No abdominal imaging  Assessment and Plan:   PRARTHANA PARLIN is a 63 y.o. pleasant Caucasian female with no past medical history, here for follow up of IDA. EGD and colonoscopy unremarkable. Currently on fusion plus, intermittently  Recheck CBC, iron studies today If persistently low, recommend video capsule endoscopy We will also refer her to hematology for parenteral iron if repeat ferritin is low   Follow up in 3 months  Cephas Darby, MD

## 2019-04-24 LAB — CBC
Hematocrit: 37.8 % (ref 34.0–46.6)
Hemoglobin: 12.2 g/dL (ref 11.1–15.9)
MCH: 25.1 pg — ABNORMAL LOW (ref 26.6–33.0)
MCHC: 32.3 g/dL (ref 31.5–35.7)
MCV: 78 fL — ABNORMAL LOW (ref 79–97)
Platelets: 263 10*3/uL (ref 150–450)
RBC: 4.87 x10E6/uL (ref 3.77–5.28)
RDW: 16.5 % — AB (ref 11.7–15.4)
WBC: 6.4 10*3/uL (ref 3.4–10.8)

## 2019-04-24 LAB — IRON AND TIBC
Iron Saturation: 11 % — ABNORMAL LOW (ref 15–55)
Iron: 43 ug/dL (ref 27–139)
Total Iron Binding Capacity: 399 ug/dL (ref 250–450)
UIBC: 356 ug/dL (ref 118–369)

## 2019-04-24 LAB — FERRITIN: Ferritin: 10 ng/mL — ABNORMAL LOW (ref 15–150)

## 2019-04-26 ENCOUNTER — Other Ambulatory Visit: Payer: Self-pay

## 2019-04-26 DIAGNOSIS — D509 Iron deficiency anemia, unspecified: Secondary | ICD-10-CM

## 2019-05-16 ENCOUNTER — Ambulatory Visit: Admit: 2019-05-16 | Payer: Medicare HMO | Admitting: Gastroenterology

## 2019-05-16 SURGERY — IMAGING PROCEDURE, GI TRACT, INTRALUMINAL, VIA CAPSULE
Anesthesia: General

## 2019-06-14 DIAGNOSIS — H2513 Age-related nuclear cataract, bilateral: Secondary | ICD-10-CM | POA: Diagnosis not present

## 2019-06-21 DIAGNOSIS — H2512 Age-related nuclear cataract, left eye: Secondary | ICD-10-CM | POA: Diagnosis not present

## 2019-06-21 DIAGNOSIS — D649 Anemia, unspecified: Secondary | ICD-10-CM | POA: Diagnosis not present

## 2019-06-29 ENCOUNTER — Other Ambulatory Visit: Payer: Self-pay

## 2019-06-29 ENCOUNTER — Other Ambulatory Visit
Admission: RE | Admit: 2019-06-29 | Discharge: 2019-06-29 | Disposition: A | Discharge status: Home / Self Care | Payer: Medicare HMO | Source: Ambulatory Visit | Attending: Ophthalmology | Admitting: Ophthalmology

## 2019-06-29 DIAGNOSIS — Z01812 Encounter for preprocedural laboratory examination: Secondary | ICD-10-CM | POA: Diagnosis not present

## 2019-06-29 DIAGNOSIS — Z20828 Contact with and (suspected) exposure to other viral communicable diseases: Secondary | ICD-10-CM | POA: Insufficient documentation

## 2019-06-30 LAB — SARS CORONAVIRUS 2 (TAT 6-24 HRS): SARS Coronavirus 2: NEGATIVE

## 2019-07-02 NOTE — Discharge Instructions (Signed)

## 2019-07-04 ENCOUNTER — Ambulatory Visit: Payer: Medicare HMO | Admitting: Anesthesiology

## 2019-07-04 ENCOUNTER — Ambulatory Visit
Admission: RE | Admit: 2019-07-04 | Discharge: 2019-07-04 | Disposition: A | Discharge status: Home / Self Care | Payer: Medicare HMO | Attending: Ophthalmology | Admitting: Ophthalmology

## 2019-07-04 ENCOUNTER — Encounter
Admission: RE | Disposition: A | Discharge status: Home / Self Care | Payer: Self-pay | Source: Home / Self Care | Attending: Ophthalmology

## 2019-07-04 ENCOUNTER — Other Ambulatory Visit: Payer: Self-pay

## 2019-07-04 DIAGNOSIS — Z7982 Long term (current) use of aspirin: Secondary | ICD-10-CM | POA: Diagnosis not present

## 2019-07-04 DIAGNOSIS — Z6841 Body Mass Index (BMI) 40.0 and over, adult: Secondary | ICD-10-CM | POA: Diagnosis not present

## 2019-07-04 DIAGNOSIS — D649 Anemia, unspecified: Secondary | ICD-10-CM | POA: Insufficient documentation

## 2019-07-04 DIAGNOSIS — H25812 Combined forms of age-related cataract, left eye: Secondary | ICD-10-CM | POA: Diagnosis not present

## 2019-07-04 DIAGNOSIS — Z9049 Acquired absence of other specified parts of digestive tract: Secondary | ICD-10-CM | POA: Insufficient documentation

## 2019-07-04 DIAGNOSIS — H2512 Age-related nuclear cataract, left eye: Secondary | ICD-10-CM | POA: Diagnosis not present

## 2019-07-04 HISTORY — PX: CATARACT EXTRACTION W/PHACO: SHX586

## 2019-07-04 SURGERY — PHACOEMULSIFICATION, CATARACT, WITH IOL INSERTION
Anesthesia: Monitor Anesthesia Care | Site: Eye | Laterality: Left

## 2019-07-04 MED ORDER — NA HYALUR & NA CHOND-NA HYALUR 0.4-0.35 ML IO KIT
PACK | INTRAOCULAR | Status: DC | PRN
  Administered 2019-07-04: 1 mL via INTRAOCULAR

## 2019-07-04 MED ORDER — CEFUROXIME OPHTHALMIC INJECTION 1 MG/0.1 ML
INJECTION | OPHTHALMIC | Status: DC | PRN
  Administered 2019-07-04: 0.1 mL via INTRACAMERAL

## 2019-07-04 MED ORDER — BRIMONIDINE TARTRATE-TIMOLOL 0.2-0.5 % OP SOLN
OPHTHALMIC | Status: DC | PRN
  Administered 2019-07-04: 1 [drp] via OPHTHALMIC

## 2019-07-04 MED ORDER — TETRACAINE HCL 0.5 % OP SOLN
1.0000 [drp] | OPHTHALMIC | Status: DC | PRN
  Administered 2019-07-04 (×3): 1 [drp] via OPHTHALMIC

## 2019-07-04 MED ORDER — MOXIFLOXACIN HCL 0.5 % OP SOLN
1.0000 [drp] | OPHTHALMIC | Status: DC | PRN
  Administered 2019-07-04 (×3): 1 [drp] via OPHTHALMIC

## 2019-07-04 MED ORDER — ONDANSETRON HCL 4 MG/2ML IJ SOLN
INTRAMUSCULAR | Status: DC | PRN
  Administered 2019-07-04: 4 mg via INTRAVENOUS

## 2019-07-04 MED ORDER — FENTANYL CITRATE (PF) 100 MCG/2ML IJ SOLN
INTRAMUSCULAR | Status: DC | PRN
  Administered 2019-07-04 (×2): 50 ug via INTRAVENOUS

## 2019-07-04 MED ORDER — ARMC OPHTHALMIC DILATING DROPS
1.0000 "application " | OPHTHALMIC | Status: DC | PRN
  Administered 2019-07-04 (×3): 1 via OPHTHALMIC

## 2019-07-04 MED ORDER — LIDOCAINE HCL (PF) 2 % IJ SOLN
INTRAOCULAR | Status: DC | PRN
  Administered 2019-07-04: 1 mL

## 2019-07-04 MED ORDER — EPINEPHRINE PF 1 MG/ML IJ SOLN
INTRAOCULAR | Status: DC | PRN
  Administered 2019-07-04: 09:00:00 57 mL via OPHTHALMIC

## 2019-07-04 MED ORDER — MIDAZOLAM HCL 2 MG/2ML IJ SOLN
INTRAMUSCULAR | Status: DC | PRN
  Administered 2019-07-04 (×2): 1 mg via INTRAVENOUS

## 2019-07-04 SURGICAL SUPPLY — 15 items
CANNULA ANT/CHMB 27GA (MISCELLANEOUS) ×2 IMPLANT
GLOVE SURG LX 7.5 STRW (GLOVE) ×1
GLOVE SURG LX STRL 7.5 STRW (GLOVE) ×1 IMPLANT
GLOVE SURG TRIUMPH 8.0 PF LTX (GLOVE) ×2 IMPLANT
GOWN STRL REUS W/ TWL LRG LVL3 (GOWN DISPOSABLE) ×2 IMPLANT
GOWN STRL REUS W/TWL LRG LVL3 (GOWN DISPOSABLE) ×4
LENS IOL TECNIS ITEC 22.0 (Intraocular Lens) ×2 IMPLANT
MARKER SKIN DUAL TIP RULER LAB (MISCELLANEOUS) ×2 IMPLANT
PACK CATARACT BRASINGTON (MISCELLANEOUS) ×2 IMPLANT
PACK EYE AFTER SURG (MISCELLANEOUS) ×2 IMPLANT
PACK OPTHALMIC (MISCELLANEOUS) ×2 IMPLANT
SYR 3ML LL SCALE MARK (SYRINGE) ×2 IMPLANT
SYR TB 1ML LUER SLIP (SYRINGE) ×2 IMPLANT
WATER STERILE IRR 500ML POUR (IV SOLUTION) ×2 IMPLANT
WIPE NON LINTING 3.25X3.25 (MISCELLANEOUS) ×2 IMPLANT

## 2019-07-04 NOTE — Anesthesia Postprocedure Evaluation (Signed)
Anesthesia Post Note  Patient: Danielle Le  Procedure(s) Performed: CATARACT EXTRACTION PHACO AND INTRAOCULAR LENS PLACEMENT (IOC) left  00:43.2  12.4%  5.40 (Left Eye)  Patient location during evaluation: PACU Anesthesia Type: MAC Level of consciousness: awake and alert Pain management: pain level controlled Vital Signs Assessment: post-procedure vital signs reviewed and stable Respiratory status: spontaneous breathing, nonlabored ventilation, respiratory function stable and patient connected to nasal cannula oxygen Cardiovascular status: stable and blood pressure returned to baseline Postop Assessment: no apparent nausea or vomiting Anesthetic complications: no    Adele Barthel Ilianna Bown

## 2019-07-04 NOTE — H&P (Signed)

## 2019-07-04 NOTE — Op Note (Signed)
OPERATIVE NOTE  Danielle Le [id number] 07/04/2019   PREOPERATIVE DIAGNOSIS:  Nuclear sclerotic cataract left eye. H25.12   POSTOPERATIVE DIAGNOSIS:    Nuclear sclerotic cataract left eye.     PROCEDURE:  Phacoemusification with posterior chamber intraocular lens placement of the left eye   LENS:   Implant Name Type Inv. Item Serial No. Manufacturer Lot No. LRB No. Used Action  LENS IOL DIOP 22.0 - [id number] Intraocular Lens LENS IOL DIOP 22.0 [id number]  Left 1 Implanted       ULTRASOUND TIME: 12  % of 0 minutes 43 seconds, CDE 5.4  SURGEON:  Wyonia Hough, MD   ANESTHESIA:  Topical with tetracaine drops and 2% Xylocaine jelly, augmented with 1% preservative-free intracameral lidocaine.    COMPLICATIONS:  None.   DESCRIPTION OF PROCEDURE:  The patient was identified in the holding room and transported to the operating room and placed in the supine position under the operating microscope.  The left eye was identified as the operative eye and it was prepped and draped in the usual sterile ophthalmic fashion.   A 1 millimeter clear-corneal paracentesis was made at the 1:30 position.  0.5 ml of preservative-free 1% lidocaine was injected into the anterior chamber.  The anterior chamber was filled with Viscoat viscoelastic.  A 2.4 millimeter keratome was used to make a near-clear corneal incision at the 10:30 position.  .  A curvilinear capsulorrhexis was made with a cystotome and capsulorrhexis forceps.  Balanced salt solution was used to hydrodissect and hydrodelineate the nucleus.   Phacoemulsification was then used in stop and chop fashion to remove the lens nucleus and epinucleus.  The remaining cortex was then removed using the irrigation and aspiration handpiece. Provisc was then placed into the capsular bag to distend it for lens placement.  A lens was then injected into the capsular bag.  The remaining viscoelastic was aspirated.   Wounds were hydrated with balanced  salt solution.  The anterior chamber was inflated to a physiologic pressure with balanced salt solution.  No wound leaks were noted. Cefuroxime 0.1 ml of a 10mg /ml solution was injected into the anterior chamber for a dose of 1 mg of intracameral antibiotic at the completion of the case.   Timolol and Brimonidine drops were applied to the eye.  The patient was taken to the recovery room in stable condition without complications of anesthesia or surgery.  [name] 07/04/2019, 8:59 AM

## 2019-07-04 NOTE — Anesthesia Preprocedure Evaluation (Signed)
Anesthesia Evaluation  Patient identified by MRN, date of birth, ID band Patient awake    History of Anesthesia Complications Negative for: history of anesthetic complications  Airway Mallampati: III  TM Distance: >3 FB Neck ROM: Full    Dental no notable dental hx.    Pulmonary neg pulmonary ROS,    Pulmonary exam normal        Cardiovascular negative cardio ROS Normal cardiovascular exam     Neuro/Psych negative neurological ROS     GI/Hepatic negative GI ROS, Neg liver ROS,   Endo/Other  Morbid obesity  Renal/GU negative Renal ROS     Musculoskeletal negative musculoskeletal ROS (+)   Abdominal   Peds  Hematology negative hematology ROS (+)   Anesthesia Other Findings   Reproductive/Obstetrics                             Anesthesia Physical Anesthesia Plan  ASA: III  Anesthesia Plan: MAC   Post-op Pain Management:    Induction: Intravenous  PONV Risk Score and Plan: 2 and Midazolam and TIVA  Airway Management Planned: Nasal Cannula and Natural Airway  Additional Equipment:   Intra-op Plan:   Post-operative Plan:   Informed Consent: I have reviewed the patients History and Physical, chart, labs and discussed the procedure including the risks, benefits and alternatives for the proposed anesthesia with the patient or authorized representative who has indicated his/her understanding and acceptance.       Plan Discussed with:   Anesthesia Plan Comments:         Anesthesia Quick Evaluation

## 2019-07-04 NOTE — Transfer of Care (Signed)
Immediate Anesthesia Transfer of Care Note  Patient: Danielle Le  Procedure(s) Performed: CATARACT EXTRACTION PHACO AND INTRAOCULAR LENS PLACEMENT (IOC) left  00:43.2  12.4%  5.40 (Left Eye)  Patient Location: PACU  Anesthesia Type: MAC  Level of Consciousness: awake, alert  and patient cooperative  Airway and Oxygen Therapy: Patient Spontanous Breathing and Patient connected to supplemental oxygen  Post-op Assessment: Post-op Vital signs reviewed, Patient's Cardiovascular Status Stable, Respiratory Function Stable, Patent Airway and No signs of Nausea or vomiting  Post-op Vital Signs: Reviewed and stable  Complications: No apparent anesthesia complications

## 2019-07-04 NOTE — Anesthesia Procedure Notes (Signed)
Procedure Name: MAC Performed by: Izetta Dakin, CRNA Pre-anesthesia Checklist: Timeout performed, Patient being monitored, Suction available, Emergency Drugs available and Patient identified Patient Re-evaluated:Patient Re-evaluated prior to induction Oxygen Delivery Method: Nasal cannula

## 2019-07-05 ENCOUNTER — Encounter: Payer: Self-pay | Admitting: Ophthalmology

## 2019-07-13 DIAGNOSIS — D649 Anemia, unspecified: Secondary | ICD-10-CM | POA: Diagnosis not present

## 2019-07-13 DIAGNOSIS — H2511 Age-related nuclear cataract, right eye: Secondary | ICD-10-CM | POA: Diagnosis not present

## 2019-07-19 ENCOUNTER — Other Ambulatory Visit: Payer: Self-pay

## 2019-07-19 ENCOUNTER — Encounter: Payer: Self-pay | Admitting: *Deleted

## 2019-07-19 NOTE — Discharge Instructions (Signed)

## 2019-07-20 ENCOUNTER — Encounter
Admission: RE | Admit: 2019-07-20 | Discharge: 2019-07-20 | Disposition: A | Discharge status: Home / Self Care | Payer: Medicare HMO | Source: Ambulatory Visit | Attending: Ophthalmology | Admitting: Ophthalmology

## 2019-07-20 DIAGNOSIS — Z20828 Contact with and (suspected) exposure to other viral communicable diseases: Secondary | ICD-10-CM | POA: Diagnosis not present

## 2019-07-20 DIAGNOSIS — Z01812 Encounter for preprocedural laboratory examination: Secondary | ICD-10-CM | POA: Diagnosis not present

## 2019-07-20 LAB — SARS CORONAVIRUS 2 (TAT 6-24 HRS): SARS Coronavirus 2: NEGATIVE

## 2019-07-23 NOTE — Anesthesia Preprocedure Evaluation (Addendum)
Anesthesia Evaluation  Patient identified by MRN, date of birth, ID band Patient awake    Reviewed: Allergy & Precautions, NPO status , Patient's Chart, lab work & pertinent test results  History of Anesthesia Complications Negative for: history of anesthetic complications  Airway Mallampati: III   Neck ROM: Full    Dental no notable dental hx.    Pulmonary neg pulmonary ROS,    Pulmonary exam normal breath sounds clear to auscultation       Cardiovascular Exercise Tolerance: Good negative cardio ROS Normal cardiovascular exam Rhythm:Regular Rate:Normal     Neuro/Psych negative neurological ROS     GI/Hepatic negative GI ROS,   Endo/Other  Obesity   Renal/GU negative Renal ROS     Musculoskeletal   Abdominal   Peds  Hematology  (+) Blood dyscrasia, anemia ,   Anesthesia Other Findings   Reproductive/Obstetrics                            Anesthesia Physical Anesthesia Plan  ASA: II  Anesthesia Plan: MAC   Post-op Pain Management:    Induction: Intravenous  PONV Risk Score and Plan: 2 and TIVA and Midazolam  Airway Management Planned: Natural Airway  Additional Equipment:   Intra-op Plan:   Post-operative Plan:   Informed Consent: I have reviewed the patients History and Physical, chart, labs and discussed the procedure including the risks, benefits and alternatives for the proposed anesthesia with the patient or authorized representative who has indicated his/her understanding and acceptance.       Plan Discussed with: CRNA  Anesthesia Plan Comments:        Anesthesia Quick Evaluation

## 2019-07-25 ENCOUNTER — Ambulatory Visit
Admission: RE | Admit: 2019-07-25 | Discharge: 2019-07-25 | Disposition: A | Discharge status: Home / Self Care | Payer: Medicare HMO | Attending: Ophthalmology | Admitting: Ophthalmology

## 2019-07-25 ENCOUNTER — Encounter
Admission: RE | Disposition: A | Discharge status: Home / Self Care | Payer: Self-pay | Source: Home / Self Care | Attending: Ophthalmology

## 2019-07-25 ENCOUNTER — Other Ambulatory Visit: Payer: Self-pay

## 2019-07-25 ENCOUNTER — Ambulatory Visit: Payer: Medicare HMO | Admitting: Anesthesiology

## 2019-07-25 DIAGNOSIS — Z90710 Acquired absence of both cervix and uterus: Secondary | ICD-10-CM | POA: Diagnosis not present

## 2019-07-25 DIAGNOSIS — H2511 Age-related nuclear cataract, right eye: Secondary | ICD-10-CM | POA: Diagnosis not present

## 2019-07-25 DIAGNOSIS — Z6841 Body Mass Index (BMI) 40.0 and over, adult: Secondary | ICD-10-CM | POA: Diagnosis not present

## 2019-07-25 DIAGNOSIS — E669 Obesity, unspecified: Secondary | ICD-10-CM | POA: Insufficient documentation

## 2019-07-25 DIAGNOSIS — Z9049 Acquired absence of other specified parts of digestive tract: Secondary | ICD-10-CM | POA: Diagnosis not present

## 2019-07-25 DIAGNOSIS — D649 Anemia, unspecified: Secondary | ICD-10-CM | POA: Diagnosis not present

## 2019-07-25 DIAGNOSIS — Z9849 Cataract extraction status, unspecified eye: Secondary | ICD-10-CM | POA: Diagnosis not present

## 2019-07-25 DIAGNOSIS — H25811 Combined forms of age-related cataract, right eye: Secondary | ICD-10-CM | POA: Diagnosis not present

## 2019-07-25 HISTORY — PX: CATARACT EXTRACTION W/PHACO: SHX586

## 2019-07-25 SURGERY — PHACOEMULSIFICATION, CATARACT, WITH IOL INSERTION
Anesthesia: Monitor Anesthesia Care | Site: Eye | Laterality: Right

## 2019-07-25 MED ORDER — NA HYALUR & NA CHOND-NA HYALUR 0.4-0.35 ML IO KIT
PACK | INTRAOCULAR | Status: DC | PRN
  Administered 2019-07-25: 1 mL via INTRAOCULAR

## 2019-07-25 MED ORDER — LACTATED RINGERS IV SOLN: 100.0000 mL/h | INTRAVENOUS | Status: DC

## 2019-07-25 MED ORDER — MIDAZOLAM HCL 2 MG/2ML IJ SOLN
INTRAMUSCULAR | Status: DC | PRN
  Administered 2019-07-25 (×2): 1 mg via INTRAVENOUS

## 2019-07-25 MED ORDER — EPINEPHRINE PF 1 MG/ML IJ SOLN
INTRAOCULAR | Status: DC | PRN
  Administered 2019-07-25: 54 mL via OPHTHALMIC

## 2019-07-25 MED ORDER — CEFUROXIME OPHTHALMIC INJECTION 1 MG/0.1 ML
INJECTION | OPHTHALMIC | Status: DC | PRN
  Administered 2019-07-25: 0.1 mL via INTRACAMERAL

## 2019-07-25 MED ORDER — BRIMONIDINE TARTRATE-TIMOLOL 0.2-0.5 % OP SOLN
OPHTHALMIC | Status: DC | PRN
  Administered 2019-07-25: 1 [drp] via OPHTHALMIC

## 2019-07-25 MED ORDER — FENTANYL CITRATE (PF) 100 MCG/2ML IJ SOLN
INTRAMUSCULAR | Status: DC | PRN
  Administered 2019-07-25: 50 ug via INTRAVENOUS

## 2019-07-25 MED ORDER — ACETAMINOPHEN 325 MG PO TABS: 650.0000 mg | ORAL_TABLET | Freq: Once | ORAL | Status: DC | PRN

## 2019-07-25 MED ORDER — ARMC OPHTHALMIC DILATING DROPS
1.0000 "application " | OPHTHALMIC | Status: DC | PRN
  Administered 2019-07-25 (×3): 1 via OPHTHALMIC

## 2019-07-25 MED ORDER — MOXIFLOXACIN HCL 0.5 % OP SOLN
1.0000 [drp] | OPHTHALMIC | Status: DC | PRN
  Administered 2019-07-25 (×3): 1 [drp] via OPHTHALMIC

## 2019-07-25 MED ORDER — TETRACAINE HCL 0.5 % OP SOLN
1.0000 [drp] | OPHTHALMIC | Status: DC | PRN
  Administered 2019-07-25 (×4): 1 [drp] via OPHTHALMIC

## 2019-07-25 MED ORDER — ONDANSETRON HCL 4 MG/2ML IJ SOLN: 4.0000 mg | Freq: Once | INTRAMUSCULAR | Status: DC | PRN

## 2019-07-25 MED ORDER — ACETAMINOPHEN 160 MG/5ML PO SOLN: 325.0000 mg | ORAL | Status: DC | PRN

## 2019-07-25 MED ORDER — LIDOCAINE HCL (PF) 2 % IJ SOLN
INTRAOCULAR | Status: DC | PRN
  Administered 2019-07-25: 1 mL

## 2019-07-25 SURGICAL SUPPLY — 16 items
CANNULA ANT/CHMB 27G (MISCELLANEOUS) ×1 IMPLANT
CANNULA ANT/CHMB 27GA (MISCELLANEOUS) ×2 IMPLANT
GLOVE SURG LX 7.5 STRW (GLOVE) ×1
GLOVE SURG LX STRL 7.5 STRW (GLOVE) ×1 IMPLANT
GLOVE SURG TRIUMPH 8.0 PF LTX (GLOVE) ×2 IMPLANT
GOWN STRL REUS W/ TWL LRG LVL3 (GOWN DISPOSABLE) ×2 IMPLANT
GOWN STRL REUS W/TWL LRG LVL3 (GOWN DISPOSABLE) ×4
LENS IOL TECNIS ITEC 23.0 (Intraocular Lens) ×1 IMPLANT
MARKER SKIN DUAL TIP RULER LAB (MISCELLANEOUS) ×2 IMPLANT
PACK CATARACT BRASINGTON (MISCELLANEOUS) ×2 IMPLANT
PACK EYE AFTER SURG (MISCELLANEOUS) ×2 IMPLANT
PACK OPTHALMIC (MISCELLANEOUS) ×2 IMPLANT
SYR 3ML LL SCALE MARK (SYRINGE) ×2 IMPLANT
SYR TB 1ML LUER SLIP (SYRINGE) ×2 IMPLANT
WATER STERILE IRR 500ML POUR (IV SOLUTION) ×2 IMPLANT
WIPE NON LINTING 3.25X3.25 (MISCELLANEOUS) ×2 IMPLANT

## 2019-07-25 NOTE — Anesthesia Postprocedure Evaluation (Signed)
Anesthesia Post Note  Patient: [name]  Procedure(s) Performed: CATARACT EXTRACTION PHACO AND INTRAOCULAR LENS PLACEMENT (IOC) RIGHT  00:41.9  12.4%  5.18 (Right Eye)  Patient location during evaluation: PACU Anesthesia Type: MAC Level of consciousness: awake and alert, oriented and patient cooperative Pain management: pain level controlled Vital Signs Assessment: post-procedure vital signs reviewed and stable Respiratory status: spontaneous breathing, nonlabored ventilation and respiratory function stable Cardiovascular status: blood pressure returned to baseline and stable Postop Assessment: adequate PO intake Anesthetic complications: no    Darrin Nipper

## 2019-07-25 NOTE — Transfer of Care (Signed)
Immediate Anesthesia Transfer of Care Note  Patient: Danielle Le  Procedure(s) Performed: CATARACT EXTRACTION PHACO AND INTRAOCULAR LENS PLACEMENT (IOC) RIGHT  00:41.9  12.4%  5.18 (Right Eye)  Patient Location: PACU  Anesthesia Type: MAC  Level of Consciousness: awake, alert  and patient cooperative  Airway and Oxygen Therapy: Patient Spontanous Breathing and Patient connected to supplemental oxygen  Post-op Assessment: Post-op Vital signs reviewed, Patient's Cardiovascular Status Stable, Respiratory Function Stable, Patent Airway and No signs of Nausea or vomiting  Post-op Vital Signs: Reviewed and stable  Complications: No apparent anesthesia complications

## 2019-07-25 NOTE — Anesthesia Procedure Notes (Signed)
Procedure Name: MAC Date/Time: 07/25/2019 9:43 AM Performed by: Georga Bora, CRNA Pre-anesthesia Checklist: Patient identified, Emergency Drugs available, Suction available, Patient being monitored and Timeout performed Oxygen Delivery Method: Nasal cannula

## 2019-07-25 NOTE — H&P (Signed)

## 2019-07-25 NOTE — Op Note (Signed)
LOCATION:  [place]   PREOPERATIVE DIAGNOSIS:    Nuclear sclerotic cataract right eye. H25.11   POSTOPERATIVE DIAGNOSIS:  Nuclear sclerotic cataract right eye.     PROCEDURE:  Phacoemusification with posterior chamber intraocular lens placement of the right eye   ULTRASOUND TIME: Procedure(s): CATARACT EXTRACTION PHACO AND INTRAOCULAR LENS PLACEMENT (IOC) RIGHT  00:41.9  12.4%  5.18 (Right)  LENS:   Implant Name Type Inv. Item Serial No. Manufacturer Lot No. LRB No. Used Action  LENS IOL DIOP 23.0 - [id number] Intraocular Lens LENS IOL DIOP 23.0 [id number]  Right 1 Implanted         SURGEON:  Wyonia Hough, MD   ANESTHESIA:  Topical with tetracaine drops and 2% Xylocaine jelly, augmented with 1% preservative-free intracameral lidocaine.    COMPLICATIONS:  None.   DESCRIPTION OF PROCEDURE:  The patient was identified in the holding room and transported to the operating room and placed in the supine position under the operating microscope.  The right eye was identified as the operative eye and it was prepped and draped in the usual sterile ophthalmic fashion.   A 1 millimeter clear-corneal paracentesis was made at the 12:00 position.  0.5 ml of preservative-free 1% lidocaine was injected into the anterior chamber. The anterior chamber was filled with Viscoat viscoelastic.  A 2.4 millimeter keratome was used to make a near-clear corneal incision at the 9:00 position.  A curvilinear capsulorrhexis was made with a cystotome and capsulorrhexis forceps.  Balanced salt solution was used to hydrodissect and hydrodelineate the nucleus.   Phacoemulsification was then used in stop and chop fashion to remove the lens nucleus and epinucleus.  The remaining cortex was then removed using the irrigation and aspiration handpiece. Provisc was then placed into the capsular bag to distend it for lens placement.  A lens was then injected into the capsular bag.  The remaining  viscoelastic was aspirated.   Wounds were hydrated with balanced salt solution.  The anterior chamber was inflated to a physiologic pressure with balanced salt solution.  No wound leaks were noted. Cefuroxime 0.1 ml of a 10mg /ml solution was injected into the anterior chamber for a dose of 1 mg of intracameral antibiotic at the completion of the case.   Timolol and Brimonidine drops were applied to the eye.  The patient was taken to the recovery room in stable condition without complications of anesthesia or surgery.   Danielle Le 07/25/2019, 9:57 AM

## 2019-07-26 ENCOUNTER — Ambulatory Visit: Payer: Medicare HMO | Admitting: Gastroenterology

## 2019-08-27 DIAGNOSIS — Z961 Presence of intraocular lens: Secondary | ICD-10-CM | POA: Diagnosis not present

## 2019-09-01 ENCOUNTER — Ambulatory Visit: Admit: 2019-09-01 | Payer: Medicare HMO

## 2019-09-01 SURGERY — PHACOEMULSIFICATION, CATARACT, WITH IOL INSERTION
Anesthesia: Choice | Laterality: Left

## 2019-09-03 ENCOUNTER — Ambulatory Visit: Payer: Medicare HMO | Admitting: Gastroenterology

## 2019-10-16 ENCOUNTER — Ambulatory Visit (INDEPENDENT_AMBULATORY_CARE_PROVIDER_SITE_OTHER): Payer: Medicare HMO | Admitting: Family Medicine

## 2019-10-16 ENCOUNTER — Encounter: Payer: Self-pay | Admitting: Family Medicine

## 2019-10-16 VITALS — Ht 62.0 in | Wt 220.0 lb

## 2019-10-16 DIAGNOSIS — E7800 Pure hypercholesterolemia, unspecified: Secondary | ICD-10-CM | POA: Diagnosis not present

## 2019-10-16 DIAGNOSIS — D509 Iron deficiency anemia, unspecified: Secondary | ICD-10-CM

## 2019-10-16 DIAGNOSIS — Z1231 Encounter for screening mammogram for malignant neoplasm of breast: Secondary | ICD-10-CM

## 2019-10-16 DIAGNOSIS — E2839 Other primary ovarian failure: Secondary | ICD-10-CM

## 2019-10-16 NOTE — Progress Notes (Signed)
Name: Danielle Le   MRN: [id number]    DOB: [birth year]   Date:10/16/2019       Progress Note  Subjective:    Chief Complaint  Chief Complaint  Patient presents with  . Follow-up    meet her new provider    I connected with  Danielle Le  on 10/16/19 at  1:20 PM EST by a video enabled telemedicine application and verified that I am speaking with the correct person using two identifiers.  I discussed the limitations of evaluation and management by telemedicine and the availability of in person appointments. The patient expressed understanding and agreed to proceed. Staff also discussed with the patient that there may be a patient responsible charge related to this service. Patient Location: home Provider Location: [facility] Additional Individuals present: none  HPI Pt is new to me, her last PCP left clinic earlier this year.   Pt has hx of HDL, anemia and is overdue for dexa and mammogram, not done due to [place] pandemic.    Colonoscopy UTD and other vaccines and immunizations appear to be deferred/refused by pt, she does confirm this today, she has no desire to do any vaccines. She is on supplements only and no prescribed medications.  She does have elevated BMI, currently 40.24  Anemia- iron deficiency, pt did go to GI, Dr. Marius Ditch She was supplementing with Fusion, but then switched to OTC iron, was advised to do BID by Dr. Marius Ditch, but she had difficulty tolerating and is not currently doing. She had anemia improve but iron panel continued to show iron deficiency and she was scheduled for a capsule study that is still pending.  She decided to do that next year with Dr. Marius Ditch, she has to arrange a lot of care for her son to be able to do a procedure and she will follow up with GI to discuss. No sx of anemia, denies fatigue  Mammogram and dexa was ordered today and she was given [place] phone number to call and arrange.     Patient Active Problem List   Diagnosis Date Noted    . Cardiac murmur 01/05/2019  . Iron deficiency anemia 01/05/2019  . Hypercholesteremia 01/05/2019  . Obesity (BMI 35.0-39.9 without comorbidity) 12/18/2018  . Family history of high cholesterol 12/18/2018  . Family history of dementia 12/18/2018    Past Surgical History:  Procedure Laterality Date  . ABDOMINAL HYSTERECTOMY    . CATARACT EXTRACTION W/PHACO Left 07/04/2019   Procedure: CATARACT EXTRACTION PHACO AND INTRAOCULAR LENS PLACEMENT ([place]) left  00:43.2  12.4%  5.40;  Surgeon: Leandrew Koyanagi, MD;  Location: [facility];  Service: Ophthalmology;  Laterality: Left;  . CATARACT EXTRACTION W/PHACO Right 07/25/2019   Procedure: CATARACT EXTRACTION PHACO AND INTRAOCULAR LENS PLACEMENT ([facility]) RIGHT  00:41.9  12.4%  5.18;  Surgeon: Leandrew Koyanagi, MD;  Location: [facility];  Service: Ophthalmology;  Laterality: Right;  . CESAREAN SECTION    . COLONOSCOPY WITH PROPOFOL N/A 01/04/2019   Procedure: COLONOSCOPY WITH PROPOFOL;  Surgeon: Lin Landsman, MD;  Location: [facility] ENDOSCOPY;  Service: Gastroenterology;  Laterality: N/A;  . ESOPHAGOGASTRODUODENOSCOPY (EGD) WITH PROPOFOL N/A 01/04/2019   Procedure: ESOPHAGOGASTRODUODENOSCOPY (EGD) WITH PROPOFOL;  Surgeon: Lin Landsman, MD;  Location: [facility] ENDOSCOPY;  Service: Gastroenterology;  Laterality: N/A;    Family History  Problem Relation Age of Onset  . Hypertension Mother   . Hyperlipidemia Mother   . Dementia Father   . Hypertension Father   . AVM  Brother   . Anxiety disorder Son   . Seizures Son   . Dementia Paternal Grandfather   . Hypertension Brother     Social History   Socioeconomic History  . Marital status: Divorced    Spouse name: Not on file  . Number of children: 1  . Years of education: 54  . Highest education level: Associate degree: occupational, Hotel manager, or vocational program  Occupational History  . Occupation: stay home mom  Tobacco Use  . Smoking status: Never Smoker  .  Smokeless tobacco: Never Used  Substance and Sexual Activity  . Alcohol use: Not Currently  . Drug use: Not Currently  . Sexual activity: Not Currently  Other Topics Concern  . Not on file  Social History Narrative  . Not on file   Social Determinants of Health   Financial Resource Strain: Low Risk   . Difficulty of Paying Living Expenses: Not hard at all  Food Insecurity: No Food Insecurity  . Worried About Charity fundraiser in the Last Year: Never true  . Ran Out of Food in the Last Year: Never true  Transportation Needs: No Transportation Needs  . Lack of Transportation (Medical): No  . Lack of Transportation (Non-Medical): No  Physical Activity: Inactive  . Days of Exercise per Week: 0 days  . Minutes of Exercise per Session: 0 min  Stress: Stress Concern Present  . Feeling of Stress : Very much  Social Connections: Moderately Isolated  . Frequency of Communication with Friends and Family: More than three times a week  . Frequency of Social Gatherings with Friends and Family: More than three times a week  . Attends Religious Services: Never  . Active Member of Clubs or Organizations: No  . Attends Archivist Meetings: Never  . Marital Status: Divorced  Human resources officer Violence: Not At Risk  . Fear of Current or Ex-Partner: No  . Emotionally Abused: No  . Physically Abused: No  . Sexually Abused: No     Current Outpatient Medications:  .  aspirin EC 81 MG tablet, Take 81 mg by mouth daily., Disp: , Rfl:  .  Coenzyme Q10 (COQ10 PO), Take by mouth daily., Disp: , Rfl:  .  Iron-FA-B Cmp-C-Biot-Probiotic (FUSION PLUS) CAPS, Take 1 pill daily, Disp: 30 capsule, Rfl: 2 .  MAGNESIUM PO, Take by mouth daily., Disp: , Rfl:  .  NIACIN PO, Take by mouth daily., Disp: , Rfl:  .  Omega-3 Fatty Acids (FISH OIL PO), Take by mouth daily., Disp: , Rfl:   No Known Allergies  I personally reviewed active problem list, medication list, allergies, family history, social  history, health maintenance, notes from last encounter, lab results, imaging with the patient/caregiver today.   Review of Systems  Constitutional: Negative.   HENT: Negative.   Eyes: Negative.   Respiratory: Negative.   Cardiovascular: Negative.   Gastrointestinal: Negative.   Endocrine: Negative.   Genitourinary: Negative.   Musculoskeletal: Negative.   Skin: Negative.   Allergic/Immunologic: Negative.   Neurological: Negative.   Hematological: Negative.   Psychiatric/Behavioral: Negative.   All other systems reviewed and are negative.     Objective:    Virtual encounter, vitals limited, only able to obtain the following Today's Vitals   10/16/19 1340  Weight: 220 lb (99.8 kg)  Height: 5\' 2"  (1.575 m)   Body mass index is 40.24 kg/m. Nursing Note and Vital Signs reviewed.  Physical Exam Vitals and nursing note reviewed.  Constitutional:  General: She is not in acute distress.    Appearance: Normal appearance. She is well-developed. She is obese. She is not toxic-appearing or diaphoretic.  HENT:     Head: Normocephalic and atraumatic.     Nose: Nose normal.  Eyes:     General:        Right eye: No discharge.        Left eye: No discharge.     Conjunctiva/sclera: Conjunctivae normal.  Neck:     Trachea: No tracheal deviation.  Pulmonary:     Effort: Pulmonary effort is normal. No respiratory distress.     Breath sounds: No stridor.  Skin:    Coloration: Skin is not jaundiced or pale.     Findings: No rash.  Neurological:     Mental Status: She is alert.     Motor: No abnormal muscle tone.     Coordination: Coordination normal.  Psychiatric:        Mood and Affect: Mood normal.        Behavior: Behavior normal.     PE limited by telephone encounter  No results found for this or any previous visit (from the past 72 hour(s)).  PHQ2/9: Depression screen [facility] 2/9 10/16/2019 12/18/2018  Decreased Interest 0 0  Down, Depressed, Hopeless 0 0  PHQ -  2 Score 0 0  Altered sleeping 0 0  Tired, decreased energy 0 0  Change in appetite 0 0  Feeling bad or failure about yourself  0 0  Trouble concentrating 0 0  Moving slowly or fidgety/restless 0 0  Suicidal thoughts 0 0  PHQ-9 Score 0 0  Difficult doing work/chores Not difficult at all Not difficult at all   PHQ-2/9 Result is negative, reviewed today  Fall Risk: Fall Risk  10/16/2019 12/18/2018  Falls in the past year? 0 1  Number falls in past yr: 0 0  Injury with Fall? 0 1     Assessment and Plan:     1. Estrogen deficiency Encouraged Calcium and Vit D supplementation and weight bearing exercises - DG Bone Density; Future  2. Encounter for screening mammogram for malignant neoplasm of breast Ordered pt has # to call and schedule - MM 3D SCREEN BREAST BILATERAL; Future  3. Iron deficiency anemia, unspecified iron deficiency anemia type Encouraged her to take iron at least 3x a week if she can tolerate and f/up with GI, she believes she has f/up appt in [place], will defer labs to GI  4. Hypercholesteremia Hx of HLD, but not on meds, pt desires to stay off, discussed risk, diet, lifestyle, recheck  - CMP w GFR - Lipid Panel    I discussed the assessment and treatment plan with the patient. The patient was provided an opportunity to ask questions and all were answered. The patient agreed with the plan and demonstrated an understanding of the instructions.  The patient was advised to call back or seek an in-person evaluation if the symptoms worsen or if the condition fails to improve as anticipated.  I provided 21 minutes of non-face-to-face time during this encounter.  Delsa Grana, PA-C 12/15/201:43 PM

## 2019-11-06 ENCOUNTER — Other Ambulatory Visit: Payer: Self-pay

## 2019-11-06 ENCOUNTER — Ambulatory Visit (INDEPENDENT_AMBULATORY_CARE_PROVIDER_SITE_OTHER): Payer: Medicare HMO | Admitting: Gastroenterology

## 2019-11-06 ENCOUNTER — Encounter: Payer: Self-pay | Admitting: Gastroenterology

## 2019-11-06 VITALS — BP 154/75 | HR 68 | Temp 98.5°F | Resp 17 | Ht 62.0 in | Wt 221.0 lb

## 2019-11-06 DIAGNOSIS — D509 Iron deficiency anemia, unspecified: Secondary | ICD-10-CM

## 2019-11-06 NOTE — Progress Notes (Signed)
Cephas Darby, MD [address]  [place]  [place], [place] [address]  Main: [phone]  Fax: [phone]    Gastroenterology Consultation  Referring Provider:     Arnetha Courser, MD Primary Care Physician:  Delsa Grana, PA-C Primary Gastroenterologist:  Dr. Cephas Darby Reason for Consultation:     Severe iron deficiency anemia        HPI:   Danielle Le is a 64 y.o. female referred by Dr. Delsa Grana, PA-C  for consultation & management of severe iron deficiency anemia.  Patient was seen by Dr. Sanda Klein for a new patient appointment and physical on 12/18/2018.  She did not have any GI symptoms at that time.  On routine blood work, she was found to have severe iron deficiency anemia, hemoglobin 9, MCV 64.4, ferritin 4, TIBC 453, low iron 18.  TSH was normal, dyspnea review revealed hypochromic, microcytic anemia and mild lymphocytosis.  Patient reports that she has been feeling tired for last several months and she attributed it to taking care of 39 year old son who is completely dependent on her as a newborn.  She denies change in stool caliber, altered bowel habits, melena, hematochezia.  Patient had a complete total hysterectomy.  She denies gross blood in urine.  Follow-up visit 01/26/2019 Since last visit, pt underwent EGD and colonoscopy which did not reveal source of IDA. Biopsies were unremarkable for H Pylori infection, celiac disease. She is taking fusion plus daily, feels that her energy levels are better  Follow-up visit 04/23/2019 She reports that the fusion plus was causing her diarrhea.  She tried to take it most of the days.  Otherwise, she denies any GI symptoms today  Follow-up visit 11/06/2019 She reports that she is tolerating fusion plus well.  She denies any GI symptoms today.  She did not undergo video capsule endoscopy as she wanted to know more information about it.  She gained about 13 pounds since March last year She said she fell yesterday onto her  left shoulder, however did not sustain any major injuries.  She does not have any restricted mobility other than mild pain  NSAIDs: None  Antiplts/Anticoagulants/Anti thrombotics: Aspirin 81  GI Procedures:  EGD and colonoscopy 01/04/19 Normal duodenal bulb and second portion of the duodenum. Biopsied. - Erythematous mucosa in the gastric body and antrum. Biopsied. - Small hiatal hernia. - Normal gastroesophageal junction and esophagus.  - The examined portion of the ileum was normal. - The entire examined colon is normal. - One diminutive polyp in the rectum, removed with a cold snare. Resected and retrieved.  DIAGNOSIS:  A. DUODENUM; COLD BIOPSY:  - ENTERIC MUCOSA WITH PRESERVED VILLOUS ARCHITECTURE AND NO SIGNIFICANT  HISTOPATHOLOGIC CHANGE.  - NEGATIVE FOR FEATURES OF CELIAC, DYSPLASIA, AND MALIGNANCY.   B. STOMACH, RANDOM; COLD BIOPSY:  - GASTRIC ANTRAL AND OXYNTIC MUCOSA DISPLAYING MILD CHRONIC INACTIVE  GASTRITIS AND FEATURES OF REACTIVE GASTROPATHY.  - NEGATIVE FOR H. PYLORI, DYSPLASIA, AND MALIGNANCY.   Comment:  An immunohistochemical study for H. pylori, with an appropriately  reactive control, is negative.   C. RECTAL POLYP; COLD SNARE:  - TUBULAR ADENOMA, INFLAMED.  - NEGATIVE FOR HIGH-GRADE DYSPLASIA AND MALIGNANCY  She denies family history of GI malignancy Brother with AVM of the spine  Past Medical History:  Diagnosis Date  . Anemia   . Cataract     Current Outpatient Medications:  .  aspirin EC 81 MG tablet, Take 81 mg by mouth daily., Disp: ,  Rfl:  .  Coenzyme Q10 (COQ10 PO), Take by mouth daily., Disp: , Rfl:  .  Iron-FA-B Cmp-C-Biot-Probiotic (FUSION PLUS) CAPS, Take 1 pill daily, Disp: 30 capsule, Rfl: 2 .  MAGNESIUM PO, Take by mouth daily., Disp: , Rfl:  .  NIACIN PO, Take by mouth daily., Disp: , Rfl:  .  Omega-3 Fatty Acids (FISH OIL PO), Take by mouth daily., Disp: , Rfl:   Family History  Problem Relation Age of Onset  . Hypertension  Mother   . Hyperlipidemia Mother   . Dementia Father   . Hypertension Father   . AVM Brother   . Anxiety disorder Son   . Seizures Son   . Dementia Paternal Grandfather   . Hypertension Brother      Social History   Tobacco Use  . Smoking status: Never Smoker  . Smokeless tobacco: Never Used  Substance Use Topics  . Alcohol use: Not Currently  . Drug use: Not Currently    Allergies as of 11/06/2019  . (No Known Allergies)    Review of Systems:    All systems reviewed and negative except where noted in HPI.   Physical Exam:  BP (!) 154/75 (BP Location: Left Arm, Patient Position: Sitting, Cuff Size: Large)   Pulse 68   Temp 98.5 F (36.9 C)   Resp 17   Ht 5\' 2"  (1.575 m)   Wt 221 lb (100.2 kg)   BMI 40.42 kg/m  No LMP recorded. Patient has had a hysterectomy.  General:   Alert,  Well-developed, well-nourished, pleasant and cooperative in NAD Head:  Normocephalic and atraumatic. Eyes:  Sclera clear, no icterus.   Conjunctiva pink. Ears:  Normal auditory acuity. Nose:  No deformity, discharge, or lesions. Mouth:  No deformity or lesions,oropharynx pink & moist. Neck:  Supple; no masses or thyromegaly. Lungs:  Respirations even and unlabored.  Clear throughout to auscultation.   No wheezes, crackles, or rhonchi. No acute distress. Heart:  Regular rate and rhythm; no murmurs, clicks, rubs, or gallops. Abdomen:  Normal bowel sounds. Soft, non-tender and non-distended without masses, hepatosplenomegaly or hernias noted.  No guarding or rebound tenderness.   Rectal: Not performed Msk:  Symmetrical without gross deformities. Good, equal movement & strength bilaterally. Pulses:  Normal pulses noted. Extremities:  No clubbing or edema.  No cyanosis. Neurologic:  Alert and oriented x3;  grossly normal neurologically. Skin:  Intact without significant lesions or rashes. No jaundice. Psych:  Alert and cooperative. Normal mood and affect.  Imaging Studies: No abdominal  imaging  Assessment and Plan:   FABIHA RIELLY is a 64 y.o. pleasant Caucasian female with no past medical history, here for follow up of IDA. EGD and colonoscopy unremarkable. Currently on fusion plus, intermittently.  Her anemia has resolved, however has iron deficiency.  Patient is responding to oral iron replacement  Recheck CBC, iron studies today If persistently low, recommend video capsule endoscopy  Follow up in 3 months  Cephas Darby, MD

## 2019-11-06 NOTE — Addendum Note (Signed)
Addended by: Darl Householder on: 11/06/2019 03:17 PM   Modules accepted: Orders

## 2019-11-07 LAB — CBC
Hematocrit: 40.4 % (ref 34.0–46.6)
Hemoglobin: 13.6 g/dL (ref 11.1–15.9)
MCH: 27 pg (ref 26.6–33.0)
MCHC: 33.7 g/dL (ref 31.5–35.7)
MCV: 80 fL (ref 79–97)
Platelets: 275 10*3/uL (ref 150–450)
RBC: 5.04 x10E6/uL (ref 3.77–5.28)
RDW: 14.1 % (ref 11.7–15.4)
WBC: 5.9 10*3/uL (ref 3.4–10.8)

## 2019-11-07 LAB — IRON,TIBC AND FERRITIN PANEL
Ferritin: 39 ng/mL (ref 15–150)
Iron Saturation: 34 % (ref 15–55)
Iron: 127 ug/dL (ref 27–139)
Total Iron Binding Capacity: 377 ug/dL (ref 250–450)
UIBC: 250 ug/dL (ref 118–369)

## 2020-01-15 DIAGNOSIS — E7800 Pure hypercholesterolemia, unspecified: Secondary | ICD-10-CM | POA: Diagnosis not present

## 2020-01-16 LAB — COMPLETE METABOLIC PANEL WITH GFR
AG Ratio: 1.5 (calc) (ref 1.0–2.5)
ALT: 16 U/L (ref 6–29)
AST: 20 U/L (ref 10–35)
Albumin: 4.1 g/dL (ref 3.6–5.1)
Alkaline phosphatase (APISO): 123 U/L (ref 37–153)
BUN: 16 mg/dL (ref 7–25)
CO2: 28 mmol/L (ref 20–32)
Calcium: 10.5 mg/dL — ABNORMAL HIGH (ref 8.6–10.4)
Chloride: 103 mmol/L (ref 98–110)
Creat: 0.68 mg/dL (ref 0.50–0.99)
GFR, Est African American: 106 mL/min/{1.73_m2} (ref >=60)
GFR, Est Non African American: 91 mL/min/{1.73_m2} (ref >=60)
Globulin: 2.7 g/dL (calc) (ref 1.9–3.7)
Glucose, Bld: 92 mg/dL (ref 65–99)
Potassium: 4.4 mmol/L (ref 3.5–5.3)
Sodium: 137 mmol/L (ref 135–146)
Total Bilirubin: 0.4 mg/dL (ref 0.2–1.2)
Total Protein: 6.8 g/dL (ref 6.1–8.1)

## 2020-01-16 LAB — LIPID PANEL
Cholesterol: 250 mg/dL — ABNORMAL HIGH (ref <200)
HDL: 40 mg/dL — ABNORMAL LOW (ref >=50)
Non-HDL Cholesterol (Calc): 210 mg/dL (calc) — ABNORMAL HIGH (ref <130)
Total CHOL/HDL Ratio: 6.3 (calc) — ABNORMAL HIGH (ref <5.0)
Triglycerides: 433 mg/dL — ABNORMAL HIGH (ref <150)

## 2020-01-18 ENCOUNTER — Ambulatory Visit (INDEPENDENT_AMBULATORY_CARE_PROVIDER_SITE_OTHER): Payer: Medicare HMO

## 2020-01-18 VITALS — Ht 62.0 in | Wt 215.0 lb

## 2020-01-18 DIAGNOSIS — Z0000 Encounter for general adult medical examination without abnormal findings: Secondary | ICD-10-CM | POA: Diagnosis not present

## 2020-01-18 NOTE — Progress Notes (Signed)
Subjective:   Danielle Le is a 64 y.o. female who presents for an Initial Medicare Annual Wellness Visit.  Virtual Visit via Telephone Note  I connected with Danielle Le on 01/18/20 at  8:40 AM EDT by telephone and verified that I am speaking with the correct person using two identifiers.  Medicare Annual Wellness visit completed telephonically due to Covid-19 pandemic.   Location: Patient: home Provider: office   I discussed the limitations, risks, security and privacy concerns of performing an evaluation and management service by telephone and the availability of in person appointments. The patient expressed understanding and agreed to proceed.  Some vital signs may be absent or patient reported.   Clemetine Marker, LPN    Review of Systems     Cardiac Risk Factors include: advanced age (>1men, >24 women);dyslipidemia;obesity (BMI >30kg/m2)     Objective:    Today's Vitals   01/18/20 0854  Weight: 215 lb (97.5 kg)  Height: 5\' 2"  (1.575 m)   Body mass index is 39.32 kg/m.  Advanced Directives 01/18/2020 07/25/2019 07/04/2019 01/04/2019  Does Patient Have a Medical Advance Directive? No No No No  Would patient like information on creating a medical advance directive? Yes (MAU/Ambulatory/Procedural Areas - Information given) No - Patient declined Yes (MAU/Ambulatory/Procedural Areas - Information given) No - Patient declined    Current Medications (verified) Outpatient Encounter Medications as of 01/18/2020  Medication Sig  . aspirin EC 81 MG tablet Take 81 mg by mouth daily.  . calcium carbonate (CALCIUM 600) 1500 (600 Ca) MG TABS tablet Take 1 tablet by mouth daily.  . Cholecalciferol (VITAMIN D) 50 MCG (2000 UT) CAPS Take 1 capsule by mouth daily.  . Coenzyme Q10 (COQ10 PO) Take by mouth daily.  . Iron-FA-B Cmp-C-Biot-Probiotic (FUSION PLUS) CAPS Take 1 pill daily  . MAGNESIUM PO Take by mouth daily.  Marland Kitchen NIACIN PO Take by mouth daily.  . Omega-3 Fatty Acids (FISH OIL  PO) Take by mouth daily.  Marland Kitchen zinc gluconate 50 MG tablet Take 50 mg by mouth daily.   No facility-administered encounter medications on file as of 01/18/2020.    Allergies (verified) Patient has no known allergies.   History: Past Medical History:  Diagnosis Date  . Anemia   . Cataract    Past Surgical History:  Procedure Laterality Date  . ABDOMINAL HYSTERECTOMY    . CATARACT EXTRACTION W/PHACO Left 07/04/2019   Procedure: CATARACT EXTRACTION PHACO AND INTRAOCULAR LENS PLACEMENT ([place]) left  00:43.2  12.4%  5.40;  Surgeon: Leandrew Koyanagi, MD;  Location: [place];  Service: Ophthalmology;  Laterality: Left;  . CATARACT EXTRACTION W/PHACO Right 07/25/2019   Procedure: CATARACT EXTRACTION PHACO AND INTRAOCULAR LENS PLACEMENT ([facility]) RIGHT  00:41.9  12.4%  5.18;  Surgeon: Leandrew Koyanagi, MD;  Location: [place];  Service: Ophthalmology;  Laterality: Right;  . CESAREAN SECTION    . COLONOSCOPY WITH PROPOFOL N/A 01/04/2019   Procedure: COLONOSCOPY WITH PROPOFOL;  Surgeon: Lin Landsman, MD;  Location: [facility] ENDOSCOPY;  Service: Gastroenterology;  Laterality: N/A;  . ESOPHAGOGASTRODUODENOSCOPY (EGD) WITH PROPOFOL N/A 01/04/2019   Procedure: ESOPHAGOGASTRODUODENOSCOPY (EGD) WITH PROPOFOL;  Surgeon: Lin Landsman, MD;  Location: [facility] ENDOSCOPY;  Service: Gastroenterology;  Laterality: N/A;   Family History  Problem Relation Age of Onset  . Hypertension Mother   . Hyperlipidemia Mother   . Dementia Father   . Hypertension Father   . AVM Brother   . Anxiety disorder Son   . Seizures Son   .  Dementia Paternal Grandfather   . Hypertension Brother    Social History   Socioeconomic History  . Marital status: Divorced    Spouse name: Not on file  . Number of children: 1  . Years of education: 70  . Highest education level: Associate degree: occupational, Hotel manager, or vocational program  Occupational History  . Occupation: stay home mom  Tobacco Use   . Smoking status: Never Smoker  . Smokeless tobacco: Never Used  Substance and Sexual Activity  . Alcohol use: Not Currently  . Drug use: Not Currently  . Sexual activity: Not Currently  Other Topics Concern  . Not on file  Social History Narrative   Pt has 66 year old special needs son that lives at home who is non ambulatory/non verbal and she is primary caregiver.    Social Determinants of Health   Financial Resource Strain: Low Risk   . Difficulty of Paying Living Expenses: Not very hard  Food Insecurity: No Food Insecurity  . Worried About Charity fundraiser in the Last Year: Never true  . Ran Out of Food in the Last Year: Never true  Transportation Needs: No Transportation Needs  . Lack of Transportation (Medical): No  . Lack of Transportation (Non-Medical): No  Physical Activity: Inactive  . Days of Exercise per Week: 0 days  . Minutes of Exercise per Session: 0 min  Stress: Stress Concern Present  . Feeling of Stress : To some extent  Social Connections: Moderately Isolated  . Frequency of Communication with Friends and Family: More than three times a week  . Frequency of Social Gatherings with Friends and Family: Never  . Attends Religious Services: Never  . Active Member of Clubs or Organizations: No  . Attends Archivist Meetings: Never  . Marital Status: Divorced    Tobacco Counseling Counseling given: Not Answered   Clinical Intake:  Pre-visit preparation completed: Yes  Pain : No/denies pain     BMI - recorded: 39.32 Nutritional Status: BMI > 30  Obese Nutritional Risks: None Diabetes: No  How often do you need to have someone help you when you read instructions, pamphlets, or other written materials from your doctor or pharmacy?: 1 - Never  Interpreter Needed?: No  Information entered by :: Clemetine Marker LPN   Activities of Daily Living In your present state of health, do you have any difficulty performing the following activities:  01/18/2020 10/16/2019  Hearing? N N  Comment declines hearing aids -  Vision? N N  Difficulty concentrating or making decisions? N N  Walking or climbing stairs? N N  Dressing or bathing? N N  Doing errands, shopping? N N  Preparing Food and eating ? N -  Using the Toilet? N -  In the past six months, have you accidently leaked urine? N -  Do you have problems with loss of bowel control? N -  Managing your Medications? N -  Managing your Finances? N -  Housekeeping or managing your Housekeeping? N -  Some recent data might be hidden     Immunizations and Health Maintenance Immunization History  Administered Date(s) Administered  . Moderna SARS-COVID-2 Vaccination 01/11/2020   Health Maintenance Due  Topic Date Due  . DEXA SCAN  Never done  . Hepatitis C Screening  Never done  . TETANUS/TDAP  Never done  . MAMMOGRAM  11/09/2008  . PNA vac Low Risk Adult (1 of 2 - PCV13) Never done    Patient Care Team:  Delsa Grana, PA-C as PCP - General (Family Medicine)  Indicate any recent Medical Services you may have received from other than [facility] providers in the past year (date may be approximate).     Assessment:   This is a routine wellness examination for Zeda.  Hearing/Vision screen  Hearing Screening   125Hz  250Hz  500Hz  1000Hz  2000Hz  3000Hz  4000Hz  6000Hz  8000Hz   Right ear:           Left ear:           Comments: Pt denies hearing difficulty  Vision Screening Comments: [facility]  Dietary issues and exercise activities discussed: Current Exercise Habits: The patient does not participate in regular exercise at present, Exercise limited by: None identified  Goals   None    Depression Screen PHQ 2/9 Scores 01/18/2020 10/16/2019 12/18/2018  PHQ - 2 Score 0 0 0  PHQ- 9 Score - 0 0    Fall Risk Fall Risk  01/18/2020 10/16/2019 12/18/2018  Falls in the past year? 1 0 1  Number falls in past yr: 1 0 0  Comment mechanical fall - -    Injury with Fall? 0 0 1  Risk for fall due to : No Fall Risks - -    FALL RISK PREVENTION PERTAINING TO THE HOME:  Any stairs in or around the home? No  If so, do they handrails? No  ramps  Home free of loose throw rugs in walkways, pet beds, electrical cords, etc? Yes  Adequate lighting in your home to reduce risk of falls? Yes   ASSISTIVE DEVICES UTILIZED TO PREVENT FALLS:  Life alert? No  Use of a cane, walker or w/c? No  Grab bars in the bathroom? No  Shower chair or bench in shower? No  Elevated toilet seat or a handicapped toilet? Yes   DME ORDERS:  DME order needed?  No   TIMED UP AND GO:  Was the test performed? No . Telephonic visit.   Education: Fall risk prevention has been discussed.  Intervention(s) required? No   Cognitive Function:     6CIT Screen 01/18/2020  What Year? 0 points  What month? 0 points  What time? 0 points  Count back from 20 0 points  Months in reverse 0 points  Repeat phrase 0 points  Total Score 0    Screening Tests Health Maintenance  Topic Date Due  . DEXA SCAN  Never done  . Hepatitis C Screening  Never done  . TETANUS/TDAP  Never done  . MAMMOGRAM  11/09/2008  . PNA vac Low Risk Adult (1 of 2 - PCV13) Never done  . INFLUENZA VACCINE  01/30/2020 (Originally 06/02/2019)  . COLONOSCOPY  01/03/2026    Qualifies for Shingles Vaccine? Yes . Due for Shingrix. Education has been provided regarding the importance of this vaccine. Pt has been advised to call insurance company to determine out of pocket expense. Advised may also receive vaccine at local pharmacy or Health Dept. Verbalized acceptance and understanding.  Tdap: Although this vaccine is not a covered service during a Wellness Exam, does the patient still wish to receive this vaccine today?  No .  Education has been provided regarding the importance of this vaccine. Advised may receive this vaccine at local pharmacy or Health Dept. Aware to provide a copy of the  vaccination record if obtained from local pharmacy or Health Dept. Verbalized acceptance and understanding.  Flu Vaccine: Due for Flu vaccine. Does the patient  want to receive this vaccine today?  No . Education has been provided regarding the importance of this vaccine but still declined. Advised may receive this vaccine at local pharmacy or Health Dept. Aware to provide a copy of the vaccination record if obtained from local pharmacy or Health Dept. Verbalized acceptance and understanding.  Pneumococcal Vaccine: Due for Pneumococcal vaccine. Does the patient want to receive this vaccine today?  No . Education has been provided regarding the importance of this vaccine but still declined. Advised may receive this vaccine at local pharmacy or Health Dept. Aware to provide a copy of the vaccination record if obtained from local pharmacy or Health Dept. Verbalized acceptance and understanding.   Cancer Screenings:  Colorectal Screening: Completed 01/04/19 . Repeat every 7 years;   Mammogram: DUE. Ordered 10/16/19. Pt provided with contact information and advised to call to schedule appt.   Bone Density: DUE. Ordered 10/16/19.Marland Kitchen Pt provided with contact information and advised to call to schedule appt.   Lung Cancer Screening: (Low Dose CT Chest recommended if Age 37-80 years, 30 pack-year currently smoking OR have quit w/in 15years.) does not qualify.    Additional Screening:  Hepatitis C Screening: does qualify; postponed  Vision Screening: Recommended annual ophthalmology exams for early detection of glaucoma and other disorders of the eye. Is the patient up to date with their annual eye exam?  Yes  Who is the provider or what is the name of the office in which the pt attends annual eye exams? [place] Screening: Recommended annual dental exams for proper oral hygiene  Community Resource Referral:  CRR required this visit?  No     Plan:    I have personally reviewed  and addressed the Medicare Annual Wellness questionnaire and have noted the following in the patient's chart:  A. Medical and social history B. Use of alcohol, tobacco or illicit drugs  C. Current medications and supplements D. Functional ability and status E.  Nutritional status F.  Physical activity G. Advance directives H. List of other physicians I.  Hospitalizations, surgeries, and ER visits in previous 12 months J.  [place] such as hearing and vision if needed, cognitive and depression L. Referrals and appointments   In addition, I have reviewed and discussed with patient certain preventive protocols, quality metrics, and best practice recommendations. A written personalized care plan for preventive services as well as general preventive health recommendations were provided to patient.   Signed,  Clemetine Marker, LPN Nurse Health Advisor    Nurse Notes: pt concerned about notification of results on MyChart regarding cholesterol. Pt reports she was NOT fasting day of labs and has had difficulty over the past year with eating and exercise habits due to pandemic and primary caregiver for special needs son. Provided information to patient about heart healthy eating from [email or web address]. Pt also confirmed she will be fasting prior to next appt.

## 2020-01-18 NOTE — Patient Instructions (Addendum)
[name] , Thank you for taking time to come for your Medicare Wellness Visit. I appreciate your ongoing commitment to your health goals. Please review the following plan we discussed and let me know if I can assist you in the future.   Screening recommendations/referrals: Colonoscopy: done 01/04/19. Repeat in 2027. Mammogram: Please call [phone] to schedule your mammogram and bone density screening.   Recommended yearly ophthalmology/optometry visit for glaucoma screening and checkup Recommended yearly dental visit for hygiene and checkup  Vaccinations: Influenza vaccine: postponed Pneumococcal vaccine: postponed Tdap vaccine: due Shingles vaccine: postponed Covid-19: 1st dose 01/11/20  Advanced directives: Advance directive discussed with you today. I have provided a copy for you to complete at home and have notarized. Once this is complete please bring a copy in to our office so we can scan it into your chart.  Conditions/risks identified: Recommend healthy eating and physical activity for desired weight loss.   Next appointment: Please follow up in one year for your Medicare Annual Wellness visit.   Visit [email or web address] for heart healthy eating information   Preventive Care 65 Years and Older, Female Preventive care refers to lifestyle choices and visits with your health care provider that can promote health and wellness. What does preventive care include?  A yearly physical exam. This is also called an annual well check.  Dental exams once or twice a year.  Routine eye exams. Ask your health care provider how often you should have your eyes checked.  Personal lifestyle choices, including:  Daily care of your teeth and gums.  Regular physical activity.  Eating a healthy diet.  Avoiding tobacco and drug use.  Limiting alcohol use.  Practicing safe sex.  Taking low-dose aspirin every day.  Taking vitamin and mineral supplements as recommended by your health  care provider. What happens during an annual well check? The services and screenings done by your health care provider during your annual well check will depend on your age, overall health, lifestyle risk factors, and family history of disease. Counseling  Your health care provider may ask you questions about your:  Alcohol use.  Tobacco use.  Drug use.  Emotional well-being.  Home and relationship well-being.  Sexual activity.  Eating habits.  History of falls.  Memory and ability to understand (cognition).  Work and work Statistician.  Reproductive health. Screening  You may have the following tests or measurements:  Height, weight, and BMI.  Blood pressure.  Lipid and cholesterol levels. These may be checked every 5 years, or more frequently if you are over 73 years old.  Skin check.  Lung cancer screening. You may have this screening every year starting at age 54 if you have a 30-pack-year history of smoking and currently smoke or have quit within the past 15 years.  Fecal occult blood test (FOBT) of the stool. You may have this test every year starting at age 36.  Flexible sigmoidoscopy or colonoscopy. You may have a sigmoidoscopy every 5 years or a colonoscopy every 10 years starting at age 62.  Hepatitis C blood test.  Hepatitis B blood test.  Sexually transmitted disease (STD) testing.  Diabetes screening. This is done by checking your blood sugar (glucose) after you have not eaten for a while (fasting). You may have this done every 1-3 years.  Bone density scan. This is done to screen for osteoporosis. You may have this done starting at age 46.  Mammogram. This may be done every 1-2 years. Talk to your health care  provider about how often you should have regular mammograms. Talk with your health care provider about your test results, treatment options, and if necessary, the need for more tests. Vaccines  Your health care provider may recommend certain  vaccines, such as:  Influenza vaccine. This is recommended every year.  Tetanus, diphtheria, and acellular pertussis (Tdap, Td) vaccine. You may need a Td booster every 10 years.  Zoster vaccine. You may need this after age 48.  Pneumococcal 13-valent conjugate (PCV13) vaccine. One dose is recommended after age 55.  Pneumococcal polysaccharide (PPSV23) vaccine. One dose is recommended after age 84. Talk to your health care provider about which screenings and vaccines you need and how often you need them. This information is not intended to replace advice given to you by your health care provider. Make sure you discuss any questions you have with your health care provider. Document Released: 11/14/2015 Document Revised: 07/07/2016 Document Reviewed: 08/19/2015 Elsevier Interactive Patient Education  2017 [place] Prevention in the Home Falls can cause injuries. They can happen to people of all ages. There are many things you can do to make your home safe and to help prevent falls. What can I do on the outside of my home?  Regularly fix the edges of walkways and driveways and fix any cracks.  Remove anything that might make you trip as you walk through a door, such as a raised step or threshold.  Trim any bushes or trees on the path to your home.  Use bright outdoor lighting.  Clear any walking paths of anything that might make someone trip, such as rocks or tools.  Regularly check to see if handrails are loose or broken. Make sure that both sides of any steps have handrails.  Any raised decks and porches should have guardrails on the edges.  Have any leaves, snow, or ice cleared regularly.  Use sand or salt on walking paths during winter.  Clean up any spills in your garage right away. This includes oil or grease spills. What can I do in the bathroom?  Use night lights.  Install grab bars by the toilet and in the tub and shower. Do not use towel bars as grab  bars.  Use non-skid mats or decals in the tub or shower.  If you need to sit down in the shower, use a plastic, non-slip stool.  Keep the floor dry. Clean up any water that spills on the floor as soon as it happens.  Remove soap buildup in the tub or shower regularly.  Attach bath mats securely with double-sided non-slip rug tape.  Do not have throw rugs and other things on the floor that can make you trip. What can I do in the bedroom?  Use night lights.  Make sure that you have a light by your bed that is easy to reach.  Do not use any sheets or blankets that are too big for your bed. They should not hang down onto the floor.  Have a firm chair that has side arms. You can use this for support while you get dressed.  Do not have throw rugs and other things on the floor that can make you trip. What can I do in the kitchen?  Clean up any spills right away.  Avoid walking on wet floors.  Keep items that you use a lot in easy-to-reach places.  If you need to reach something above you, use a strong step stool that has a grab bar.  Keep electrical cords out of the way.  Do not use floor polish or wax that makes floors slippery. If you must use wax, use non-skid floor wax.  Do not have throw rugs and other things on the floor that can make you trip. What can I do with my stairs?  Do not leave any items on the stairs.  Make sure that there are handrails on both sides of the stairs and use them. Fix handrails that are broken or loose. Make sure that handrails are as long as the stairways.  Check any carpeting to make sure that it is firmly attached to the stairs. Fix any carpet that is loose or worn.  Avoid having throw rugs at the top or bottom of the stairs. If you do have throw rugs, attach them to the floor with carpet tape.  Make sure that you have a light switch at the top of the stairs and the bottom of the stairs. If you do not have them, ask someone to add them for  you. What else can I do to help prevent falls?  Wear shoes that:  Do not have high heels.  Have rubber bottoms.  Are comfortable and fit you well.  Are closed at the toe. Do not wear sandals.  If you use a stepladder:  Make sure that it is fully opened. Do not climb a closed stepladder.  Make sure that both sides of the stepladder are locked into place.  Ask someone to hold it for you, if possible.  Clearly mark and make sure that you can see:  Any grab bars or handrails.  First and last steps.  Where the edge of each step is.  Use tools that help you move around (mobility aids) if they are needed. These include:  Canes.  Walkers.  Scooters.  Crutches.  Turn on the lights when you go into a dark area. Replace any light bulbs as soon as they burn out.  Set up your furniture so you have a clear path. Avoid moving your furniture around.  If any of your floors are uneven, fix them.  If there are any pets around you, be aware of where they are.  Review your medicines with your doctor. Some medicines can make you feel dizzy. This can increase your chance of falling. Ask your doctor what other things that you can do to help prevent falls. This information is not intended to replace advice given to you by your health care provider. Make sure you discuss any questions you have with your health care provider. Document Released: 08/14/2009 Document Revised: 03/25/2016 Document Reviewed: 11/22/2014 Elsevier Interactive Patient Education  2017 [employer].

## 2020-02-05 ENCOUNTER — Encounter: Payer: Self-pay | Admitting: Family Medicine

## 2020-02-05 ENCOUNTER — Other Ambulatory Visit: Payer: Self-pay

## 2020-02-05 ENCOUNTER — Ambulatory Visit (INDEPENDENT_AMBULATORY_CARE_PROVIDER_SITE_OTHER): Payer: Medicare HMO | Admitting: Family Medicine

## 2020-02-05 DIAGNOSIS — E559 Vitamin D deficiency, unspecified: Secondary | ICD-10-CM | POA: Diagnosis not present

## 2020-02-05 DIAGNOSIS — E781 Pure hyperglyceridemia: Secondary | ICD-10-CM

## 2020-02-05 DIAGNOSIS — E669 Obesity, unspecified: Secondary | ICD-10-CM | POA: Diagnosis not present

## 2020-02-05 DIAGNOSIS — E785 Hyperlipidemia, unspecified: Secondary | ICD-10-CM

## 2020-02-05 DIAGNOSIS — Z6835 Body mass index (BMI) 35.0-35.9, adult: Secondary | ICD-10-CM

## 2020-02-05 NOTE — Progress Notes (Signed)
Name: Danielle Le   MRN: [id number]    DOB: Mar 27, 1956   Date:02/05/2020       Progress Note  Chief Complaint  Patient presents with  . Follow-up  . Anemia  . Hyperlipidemia     Subjective:   HESTA Le is a 64 y.o. female, presents to clinic for routine follow up on the conditions listed above.  Abnormal labs 3 weeks ago - which she completed delayed after last routine OV which was three months ago She is here for f/up  The 10-year ASCVD risk score Mikey Bussing DC Brooke Bonito., et al., 2013) is: 7.1%   Values used to calculate the score:     Age: 54 years     Sex: Female     Is Non-Hispanic African American: No     Diabetic: No     Tobacco smoker: No     Systolic Blood Pressure: 123XX123 mmHg     Is BP treated: No     HDL Cholesterol: 40 mg/dL     Total Cholesterol: 250 mg/dL  She had triglycerides high 433 and had eaten before labs, she reports diet has been worse with being home for the past year and through the holidays she reports eating much more, baking sweets more She would like to redo labs today because she has been fasting.  She has not been on a statin before, though her cholesterol has been elevated since establishing here   She had elevated calcium - she has been taking a lot of supplements and vitamins including CoQ10, Vit D 2000IU and calcium 500 mg  She has hx of Vit D deficiency- she requests lab be rechecked.   She is due for bone density screening-dexa ordered - discussed today osteoporosis prevention and monitoring/supplementation with Vit D and calcium and weight bearing exercise.    Obesity: She has been trying to loose weight, about 6 lbs weight loss over the past 6-7 months Wt Readings from Last 5 Encounters:  02/05/20 214 lb 11.2 oz (97.4 kg)  01/18/20 215 lb (97.5 kg)  11/06/19 221 lb (100.2 kg)  10/16/19 220 lb (99.8 kg)  07/25/19 220 lb (99.8 kg)   BMI Readings from Last 5 Encounters:  02/05/20 39.27 kg/m  01/18/20 39.32 kg/m  11/06/19 40.42 kg/m    10/16/19 40.24 kg/m  07/25/19 40.24 kg/m   Iron deficiency - she is seeing GI for anemia - last CBC improved in [place], so capsule study was put on hold, pt completed iron supplement   Patient Active Problem List   Diagnosis Date Noted  . Cardiac murmur 01/05/2019  . Iron deficiency anemia 01/05/2019  . Hypercholesteremia 01/05/2019  . Obesity (BMI 35.0-39.9 without comorbidity) 12/18/2018  . Family history of high cholesterol 12/18/2018  . Family history of dementia 12/18/2018    Past Surgical History:  Procedure Laterality Date  . ABDOMINAL HYSTERECTOMY    . CATARACT EXTRACTION W/PHACO Left 07/04/2019   Procedure: CATARACT EXTRACTION PHACO AND INTRAOCULAR LENS PLACEMENT ([place]) left  00:43.2  12.4%  5.40;  Surgeon: Leandrew Koyanagi, MD;  Location: [facility];  Service: Ophthalmology;  Laterality: Left;  . CATARACT EXTRACTION W/PHACO Right 07/25/2019   Procedure: CATARACT EXTRACTION PHACO AND INTRAOCULAR LENS PLACEMENT ([facility]) RIGHT  00:41.9  12.4%  5.18;  Surgeon: Leandrew Koyanagi, MD;  Location: [place];  Service: Ophthalmology;  Laterality: Right;  . CESAREAN SECTION    . COLONOSCOPY WITH PROPOFOL N/A 01/04/2019   Procedure: COLONOSCOPY WITH PROPOFOL;  Surgeon: Marius Ditch,  Tally Due, MD;  Location: [facility];  Service: Gastroenterology;  Laterality: N/A;  . ESOPHAGOGASTRODUODENOSCOPY (EGD) WITH PROPOFOL N/A 01/04/2019   Procedure: ESOPHAGOGASTRODUODENOSCOPY (EGD) WITH PROPOFOL;  Surgeon: Lin Landsman, MD;  Location[facility] ENDOSCOPY;  Service: Gastroenterology;  Laterality: N/A;    Family History  Problem Relation Age of Onset  . Hypertension Mother   . Hyperlipidemia Mother   . Dementia Father   . Hypertension Father   . AVM Brother   . Anxiety disorder Son   . Seizures Son   . Dementia Paternal Grandfather   . Hypertension Brother     Social History   Tobacco Use  . Smoking status: Never Smoker  . Smokeless tobacco: Never Used   Substance Use Topics  . Alcohol use: Not Currently  . Drug use: Not Currently      Current Outpatient Medications:  .  Apple Cider Vinegar 188 MG CAPS, Take by mouth., Disp: , Rfl:  .  aspirin EC 81 MG tablet, Take 81 mg by mouth daily., Disp: , Rfl:  .  calcium carbonate (CALCIUM 600) 1500 (600 Ca) MG TABS tablet, Take 1 tablet by mouth daily., Disp: , Rfl:  .  Cholecalciferol (VITAMIN D) 50 MCG (2000 UT) CAPS, Take 1 capsule by mouth daily., Disp: , Rfl:  .  Coenzyme Q10 (COQ10 PO), Take by mouth daily., Disp: , Rfl:  .  Flaxseed, Linseed, (FLAX SEED OIL) 1000 MG CAPS, Take by mouth., Disp: , Rfl:  .  MAGNESIUM PO, Take by mouth daily., Disp: , Rfl:  .  NIACIN PO, Take by mouth daily., Disp: , Rfl:  .  Omega-3 Fatty Acids (FISH OIL PO), Take by mouth daily., Disp: , Rfl:  .  zinc gluconate 50 MG tablet, Take 50 mg by mouth daily., Disp: , Rfl:  .  Iron-FA-B Cmp-C-Biot-Probiotic (FUSION PLUS) CAPS, Take 1 pill daily (Patient not taking: Reported on 02/05/2020), Disp: 30 capsule, Rfl: 2  No Known Allergies  Chart Review Today: I personally reviewed active problem list, medication list, allergies, family history, social history, health maintenance, notes from last encounter, lab results, imaging with the patient/caregiver today.   Review of Systems  10 Systems reviewed and are negative for acute change except as noted in the HPI.  Objective:    Vitals:   02/05/20 1450  BP: 122/78  Pulse: 95  Resp: 14  Temp: (!) 97.5 F (36.4 C)  SpO2: 94%  Weight: 214 lb 11.2 oz (97.4 kg)  Height: 5\' 2"  (1.575 m)    Body mass index is 39.27 kg/m.  Physical Exam Vitals and nursing note reviewed.  Constitutional:      General: She is not in acute distress.    Appearance: Normal appearance. She is well-developed. She is obese. She is not ill-appearing, toxic-appearing or diaphoretic.     Interventions: Face mask in place.  HENT:     Head: Normocephalic and atraumatic.     Right Ear:  External ear normal.     Left Ear: External ear normal.  Eyes:     General: Lids are normal. No scleral icterus.       Right eye: No discharge.        Left eye: No discharge.     Conjunctiva/sclera: Conjunctivae normal.  Neck:     Trachea: Phonation normal. No tracheal deviation.  Cardiovascular:     Rate and Rhythm: Normal rate and regular rhythm.     Pulses: Normal pulses.  Radial pulses are 2+ on the right side and 2+ on the left side.       Posterior tibial pulses are 2+ on the right side and 2+ on the left side.     Heart sounds: Normal heart sounds. No murmur. No friction rub. No gallop.   Pulmonary:     Effort: Pulmonary effort is normal. No respiratory distress.     Breath sounds: Normal breath sounds. No stridor. No wheezing, rhonchi or rales.  Chest:     Chest wall: No tenderness.  Abdominal:     General: Bowel sounds are normal. There is no distension.     Palpations: Abdomen is soft.     Tenderness: There is no abdominal tenderness. There is no guarding or rebound.  Musculoskeletal:        General: No deformity. Normal range of motion.     Cervical back: Normal range of motion and neck supple.     Right lower leg: No edema.     Left lower leg: No edema.  Lymphadenopathy:     Cervical: No cervical adenopathy.  Skin:    General: Skin is warm and dry.     Capillary Refill: Capillary refill takes less than 2 seconds.     Coloration: Skin is not jaundiced or pale.     Findings: No rash.  Neurological:     Mental Status: She is alert and oriented to person, place, and time.     Motor: No abnormal muscle tone.     Gait: Gait normal.  Psychiatric:        Speech: Speech normal.        Behavior: Behavior normal.       PHQ2/9: Depression screen [facility] 2/9 02/05/2020 01/18/2020 10/16/2019 12/18/2018  Decreased Interest 0 0 0 0  Down, Depressed, Hopeless 0 0 0 0  PHQ - 2 Score 0 0 0 0  Altered sleeping 0 - 0 0  Tired, decreased energy 0 - 0 0  Change in appetite  0 - 0 0  Feeling bad or failure about yourself  0 - 0 0  Trouble concentrating 0 - 0 0  Moving slowly or fidgety/restless 0 - 0 0  Suicidal thoughts 0 - 0 0  PHQ-9 Score 0 - 0 0  Difficult doing work/chores Not difficult at all - Not difficult at all Not difficult at all    phq 9 is neg, reviewed  Fall Risk: Fall Risk  02/05/2020 01/18/2020 10/16/2019 12/18/2018  Falls in the past year? 1 1 0 1  Number falls in past yr: 0 1 0 0  Comment - mechanical fall - -  Injury with Fall? 0 0 0 1  Risk for fall due to : - No Fall Risks - -    Functional Status Survey: Is the patient deaf or have difficulty hearing?: No Does the patient have difficulty seeing, even when wearing glasses/contacts?: No Does the patient have difficulty concentrating, remembering, or making decisions?: No Does the patient have difficulty walking or climbing stairs?: No Does the patient have difficulty dressing or bathing?: No Does the patient have difficulty doing errands alone such as visiting a doctor's office or shopping?: No   Assessment & Plan:   1. Hypercalcemia Mildly high pt was on OTC supplement and then held it for the last three weeks, recheck calcium - Comprehensive Metabolic Panel (CMET) - VITAMIN D 25 Hydroxy (Vit-D Deficiency, Fractures)  2. Hypertriglyceridemia High trigs, last labs not fasting, recheck today Pt advised  to work on low glycemic index eating, diet and exercise, rechecking fasting lab today, avoid ETOH  - Comprehensive Metabolic Panel (CMET) - Lipid panel  3. Hyperlipidemia, unspecified hyperlipidemia type Reviewed ASCVD risk calculation - likely has familial HLD Discussed statin MOA and indication for prevention and risk reduction will start if ASCVD risk >7.5% or LDL >160 - Comprehensive Metabolic Panel (CMET) - Lipid panel  4. Vitamin D deficiency Check labs, not on supplement for the past 10-20 yrs, dexa pending - VITAMIN D 25 Hydroxy (Vit-D Deficiency, Fractures)   5.  Class 2 obesity with body mass index (BMI) of 35.0 to 35.9 in adult, unspecified obesity type, unspecified whether serious comorbidity present Encouraged diet and exercise changes - avoid simple sugars, avoid trans fat/saturated fat - Amb ref to Medical Nutrition Therapy-MNT   Health Maintenance  Topic Date Due  . DEXA SCAN  Never done  . Hepatitis C Screening  Never done  . TETANUS/TDAP  Never done  . MAMMOGRAM  11/09/2008  . PNA vac Low Risk Adult (1 of 2 - PCV13) 02/04/2021 (Originally 11/02/2018)  . INFLUENZA VACCINE  06/01/2020  . COLONOSCOPY  01/03/2026   Discussed dexa and mammogram - ordered 2x, pt given info to call and schedule when she can Discussed PNA vax - she refuses today, will think about it  Delsa Grana, PA-C 02/05/20 3:20 PM

## 2020-02-05 NOTE — Patient Instructions (Signed)
High Triglycerides Eating Plan Triglycerides are a type of fat in the blood. High levels of triglycerides can increase your risk of heart disease and stroke. If your triglyceride levels are high, choosing the right foods can help lower your triglycerides and keep your heart healthy. Work with your health care provider or a diet and nutrition specialist (dietitian) to develop an eating plan that is right for you. What are tips for following this plan? General guidelines   Lose weight, if you are overweight. For most people, losing 5-10 lbs (2-5 kg) helps lower triglyceride levels. A weight-loss plan may include. ? 30 minutes of exercise at least 5 days a week. ? Reducing the amount of calories, sugar, and fat you eat.  Eat a wide variety of fresh fruits, vegetables, and whole grains. These foods are high in fiber.  Eat foods that contain healthy fats, such as fatty fish, nuts, seeds, and olive oil.  Avoid foods that are high in added sugar, added salt (sodium), saturated fat, and trans fat.  Avoid low-fiber, refined carbohydrates such as white bread, crackers, noodles, and white rice.  Avoid foods with partially hydrogenated oils (trans fats), such as fried foods or stick margarine.  Limit alcohol intake to no more than 1 drink a day for nonpregnant women and 2 drinks a day for men. One drink equals 12 oz of beer, 5 oz of wine, or 1 oz of hard liquor. Your health care provider may recommend that you drink less depending on your overall health. Reading food labels  Check food labels for the amount of saturated fat. Choose foods with no or very little saturated fat.  Check food labels for the amount of trans fat. Choose foods with no trans fat.  Check food labels for the amount of cholesterol. Choose foods low in cholesterol. Ask your dietitian how much cholesterol you should have each day.  Check food labels for the amount of sodium. Choose foods with less than 140 milligrams (mg) per  serving. Shopping  Buy dairy products labeled as nonfat (skim) or low-fat (1%).  Avoid buying processed or prepackaged foods. These are often high in added sugar, sodium, and fat. Cooking  Choose healthy fats when cooking, such as olive oil or canola oil.  Cook foods using lower fat methods, such as baking, broiling, boiling, or grilling.  Make your own sauces, dressings, and marinades when possible, instead of buying them. Store-bought sauces, dressings, and marinades are often high in sodium and sugar. Meal planning  Eat more home-cooked food and less restaurant, buffet, and fast food.  Eat fatty fish at least 2 times each week. Examples of fatty fish include salmon, trout, mackerel, tuna, and herring.  If you eat whole eggs, do not eat more than 3 egg yolks per week. What foods are recommended? The items listed may not be a complete list. Talk with your dietitian about what dietary choices are best for you. Grains Whole wheat or whole grain breads, crackers, cereals, and pasta. Unsweetened oatmeal. Bulgur. Barley. Quinoa. Brown rice. Whole wheat flour tortillas. Vegetables Fresh or frozen vegetables. Low-sodium canned vegetables. Fruits All fresh, canned (in natural juice), or frozen fruits. Meats and other protein foods Skinless chicken or turkey. Ground chicken or turkey. Lean cuts of pork, trimmed of fat. Fish and seafood, especially salmon, trout, and herring. Egg whites. Dried beans, peas, or lentils. Unsalted nuts or seeds. Unsalted canned beans. Natural peanut or almond butter. Dairy Low-fat dairy products. Skim or low-fat (1%) milk. Reduced fat (  2%) and low-sodium cheese. Low-fat ricotta cheese. Low-fat cottage cheese. Plain, low-fat yogurt. Fats and oils Tub margarine without trans fats. Light or reduced-fat mayonnaise. Light or reduced-fat salad dressings. Avocado. Safflower, olive, sunflower, soybean, and canola oils. What foods are not recommended? The items listed  may not be a complete list. Talk with your dietitian about what dietary choices are best for you. Grains White bread. White (regular) pasta. White rice. Cornbread. Bagels. Pastries. Crackers that contain trans fat. Vegetables Creamed or fried vegetables. Vegetables in a cheese sauce. Fruits Sweetened dried fruit. Canned fruit in syrup. Fruit juice. Meats and other protein foods Fatty cuts of meat. Ribs. Chicken wings. Bacon. Sausage. Bologna. Salami. Chitterlings. Fatback. Hot dogs. Bratwurst. Packaged lunch meats. Dairy Whole or reduced-fat (2%) milk. Half-and-half. Cream cheese. Full-fat or sweetened yogurt. Full-fat cheese. Nondairy creamers. Whipped toppings. Processed cheese or cheese spreads. Cheese curds. Beverages Alcohol. Sweetened drinks, such as soda, lemonade, fruit drinks, or punches. Fats and oils Butter. Stick margarine. Lard. Shortening. Ghee. Bacon fat. Tropical oils, such as coconut, palm kernel, or palm oils. Sweets and desserts Corn syrup. Sugars. Honey. Molasses. Candy. Jam and jelly. Syrup. Sweetened cereals. Cookies. Pies. Cakes. Donuts. Muffins. Ice cream. Condiments Store-bought sauces, dressings, and marinades that are high in sugar, such as ketchup and barbecue sauce. Summary  High levels of triglycerides can increase the risk of heart disease and stroke. Choosing the right foods can help lower your triglycerides.  Eat plenty of fresh fruits, vegetables, and whole grains. Choose low-fat dairy and lean meats. Eat fatty fish at least twice a week.  Avoid processed and prepackaged foods with added sugar, sodium, saturated fat, and trans fat.  If you need suggestions or have questions about what types of food are good for you, talk with your health care provider or a dietitian. This information is not intended to replace advice given to you by your health care provider. Make sure you discuss any questions you have with your health care provider. Document Revised:  09/30/2017 Document Reviewed: 12/21/2016 Elsevier Patient Education  2020 [employer] Inc.  

## 2020-02-06 ENCOUNTER — Other Ambulatory Visit: Payer: Self-pay | Admitting: Family Medicine

## 2020-02-06 DIAGNOSIS — E559 Vitamin D deficiency, unspecified: Secondary | ICD-10-CM

## 2020-02-06 DIAGNOSIS — E669 Obesity, unspecified: Secondary | ICD-10-CM

## 2020-02-06 DIAGNOSIS — E7800 Pure hypercholesterolemia, unspecified: Secondary | ICD-10-CM

## 2020-02-06 DIAGNOSIS — Z8342 Family history of familial hypercholesterolemia: Secondary | ICD-10-CM

## 2020-02-06 DIAGNOSIS — E781 Pure hyperglyceridemia: Secondary | ICD-10-CM | POA: Insufficient documentation

## 2020-02-06 LAB — LIPID PANEL
Cholesterol: 272 mg/dL — ABNORMAL HIGH (ref <200)
HDL: 44 mg/dL — ABNORMAL LOW (ref >=50)
LDL Cholesterol (Calc): 174 mg/dL (calc) — ABNORMAL HIGH
Non-HDL Cholesterol (Calc): 228 mg/dL (calc) — ABNORMAL HIGH (ref <130)
Total CHOL/HDL Ratio: 6.2 (calc) — ABNORMAL HIGH (ref <5.0)
Triglycerides: 318 mg/dL — ABNORMAL HIGH (ref <150)

## 2020-02-06 LAB — COMPREHENSIVE METABOLIC PANEL
AG Ratio: 1.7 (calc) (ref 1.0–2.5)
ALT: 16 U/L (ref 6–29)
AST: 18 U/L (ref 10–35)
Albumin: 4.3 g/dL (ref 3.6–5.1)
Alkaline phosphatase (APISO): 121 U/L (ref 37–153)
BUN: 13 mg/dL (ref 7–25)
CO2: 29 mmol/L (ref 20–32)
Calcium: 10.9 mg/dL — ABNORMAL HIGH (ref 8.6–10.4)
Chloride: 102 mmol/L (ref 98–110)
Creat: 0.64 mg/dL (ref 0.50–0.99)
Globulin: 2.6 g/dL (calc) (ref 1.9–3.7)
Glucose, Bld: 83 mg/dL (ref 65–99)
Potassium: 4.5 mmol/L (ref 3.5–5.3)
Sodium: 137 mmol/L (ref 135–146)
Total Bilirubin: 0.5 mg/dL (ref 0.2–1.2)
Total Protein: 6.9 g/dL (ref 6.1–8.1)

## 2020-02-06 LAB — VITAMIN D 25 HYDROXY (VIT D DEFICIENCY, FRACTURES): Vit D, 25-Hydroxy: 18 ng/mL — ABNORMAL LOW (ref 30–100)

## 2020-02-06 MED ORDER — VITAMIN D (ERGOCALCIFEROL) 1.25 MG (50000 UNIT) PO CAPS: 50000.0000 [IU] | ORAL_CAPSULE | ORAL | 0 refills | Status: DC

## 2020-02-06 MED ORDER — ROSUVASTATIN CALCIUM 10 MG PO TABS: 10.0000 mg | ORAL_TABLET | Freq: Every day | ORAL | 3 refills | Status: DC

## 2020-02-07 ENCOUNTER — Other Ambulatory Visit: Payer: Self-pay

## 2020-02-07 DIAGNOSIS — E559 Vitamin D deficiency, unspecified: Secondary | ICD-10-CM

## 2020-02-12 ENCOUNTER — Ambulatory Visit: Payer: Medicare HMO | Admitting: Gastroenterology

## 2020-02-19 ENCOUNTER — Ambulatory Visit: Payer: Medicare HMO | Admitting: Gastroenterology

## 2020-04-28 ENCOUNTER — Other Ambulatory Visit: Payer: Self-pay | Admitting: Family Medicine

## 2020-04-28 DIAGNOSIS — E559 Vitamin D deficiency, unspecified: Secondary | ICD-10-CM

## 2020-04-30 DIAGNOSIS — E559 Vitamin D deficiency, unspecified: Secondary | ICD-10-CM | POA: Diagnosis not present

## 2020-05-01 ENCOUNTER — Encounter: Payer: Self-pay | Admitting: Family Medicine

## 2020-05-01 LAB — COMPREHENSIVE METABOLIC PANEL
AG Ratio: 1.5 (calc) (ref 1.0–2.5)
ALT: 18 U/L (ref 6–29)
AST: 23 U/L (ref 10–35)
Albumin: 4.6 g/dL (ref 3.6–5.1)
Alkaline phosphatase (APISO): 127 U/L (ref 37–153)
BUN: 11 mg/dL (ref 7–25)
CO2: 27 mmol/L (ref 20–32)
Calcium: 11.3 mg/dL — ABNORMAL HIGH (ref 8.6–10.4)
Chloride: 103 mmol/L (ref 98–110)
Creat: 0.68 mg/dL (ref 0.50–0.99)
Globulin: 3.1 g/dL (calc) (ref 1.9–3.7)
Glucose, Bld: 89 mg/dL (ref 65–99)
Potassium: 4.7 mmol/L (ref 3.5–5.3)
Sodium: 139 mmol/L (ref 135–146)
Total Bilirubin: 0.7 mg/dL (ref 0.2–1.2)
Total Protein: 7.7 g/dL (ref 6.1–8.1)

## 2020-05-01 LAB — PARATHYROID HORMONE, INTACT (NO CA): PTH: 106 pg/mL — ABNORMAL HIGH (ref 14–64)

## 2020-05-01 LAB — VITAMIN D 25 HYDROXY (VIT D DEFICIENCY, FRACTURES): Vit D, 25-Hydroxy: 40 ng/mL (ref 30–100)

## 2020-05-02 DIAGNOSIS — Z961 Presence of intraocular lens: Secondary | ICD-10-CM | POA: Diagnosis not present

## 2020-05-02 DIAGNOSIS — H35371 Puckering of macula, right eye: Secondary | ICD-10-CM | POA: Diagnosis not present

## 2020-05-08 ENCOUNTER — Other Ambulatory Visit: Payer: Self-pay

## 2020-05-08 ENCOUNTER — Encounter: Payer: Self-pay | Admitting: Family Medicine

## 2020-05-08 ENCOUNTER — Ambulatory Visit (INDEPENDENT_AMBULATORY_CARE_PROVIDER_SITE_OTHER): Payer: Medicare HMO | Admitting: Family Medicine

## 2020-05-08 VITALS — BP 126/74 | HR 80 | Temp 97.3°F | Resp 16 | Ht 62.0 in | Wt 214.1 lb

## 2020-05-08 DIAGNOSIS — E213 Hyperparathyroidism, unspecified: Secondary | ICD-10-CM

## 2020-05-08 DIAGNOSIS — E785 Hyperlipidemia, unspecified: Secondary | ICD-10-CM | POA: Diagnosis not present

## 2020-05-08 DIAGNOSIS — Z5181 Encounter for therapeutic drug level monitoring: Secondary | ICD-10-CM

## 2020-05-08 DIAGNOSIS — Z1159 Encounter for screening for other viral diseases: Secondary | ICD-10-CM | POA: Diagnosis not present

## 2020-05-08 DIAGNOSIS — Z6839 Body mass index (BMI) 39.0-39.9, adult: Secondary | ICD-10-CM | POA: Diagnosis not present

## 2020-05-08 MED ORDER — ROSUVASTATIN CALCIUM 10 MG PO TABS: 10.0000 mg | ORAL_TABLET | Freq: Every day | ORAL | 3 refills | Status: DC

## 2020-05-08 NOTE — Patient Instructions (Addendum)
Hypercalcemia Hypercalcemia is when the level of calcium in a person's blood is above normal. The body needs calcium to make bones and keep them strong. Calcium also helps the muscles, nerves, brain, and heart work the way they should. Most of the calcium in the body is in the bones. There is also some calcium in the blood. Hypercalcemia can happen when calcium comes out of the bones, or when the kidneys are not able to remove calcium from the blood. Hypercalcemia can be mild or severe. What are the causes? There are many possible causes of hypercalcemia. Common causes of this condition include:  Hyperparathyroidism. This is a condition in which the body produces too much parathyroid hormone. There are four parathyroid glands in your neck. These glands produce a chemical messenger (hormone) that helps the body absorb calcium from foods and helps your bones release calcium.  Certain kinds of cancer. Less common causes of hypercalcemia include:  Getting too much calcium or vitamin D from your diet.  Kidney failure.  Hyperthyroidism.  Severe dehydration.  Being on bed rest or being inactive for a long time.  Certain medicines.  Infections. What increases the risk? You are more likely to develop this condition if you:  Are female.  Are 60 years of age or older.  Have a family history of hypercalcemia. What are the signs or symptoms? Mild hypercalcemia that starts slowly may not cause symptoms. Severe, sudden hypercalcemia is more likely to cause symptoms, such as:  Being more thirsty than usual.  Needing to urinate more often than usual.  Abdominal pain.  Nausea and vomiting.  Constipation.  Muscle pain, twitching, or weakness.  Feeling very tired. How is this diagnosed?  Hypercalcemia is usually diagnosed with a blood test. You may also have tests to help determine what is causing this condition, such as imaging tests and more blood tests. How is this  treated? Treatment for hypercalcemia depends on the cause. Treatment may include:  Receiving fluids through an IV.  Medicines that: ? Keep calcium levels steady after receiving fluids (loop diuretics). ? Keep calcium in your bones (bisphosphonates). ? Lower the calcium level in your blood.  Surgery to remove overactive parathyroid glands.  A procedure that filters your blood to correct calcium levels (hemodialysis). Follow these instructions at home:   Take over-the-counter and prescription medicines only as told by your health care provider.  Follow instructions from your health care provider about eating or drinking restrictions.  Drink enough fluid to keep your urine pale yellow.  Stay active. Weight-bearing exercise helps to keep calcium in your bones. Follow instructions from your health care provider about what type and level of exercise is safe for you.  Keep all follow-up visits as told by your health care provider. This is important. Contact a health care provider if you have:  A fever.  A heartbeat that is irregular or very fast.  Changes in mood, memory, or personality. Get help right away if you:  Have severe abdominal pain.  Have chest pain.  Have trouble breathing.  Become very confused and sleepy.  Lose consciousness. Summary  Hypercalcemia is when the level of calcium in a person's blood is above normal. The body needs calcium to make bones and keep them strong. Calcium also helps the muscles, nerves, brain, and heart work the way they should.  There are many possible causes of hypercalcemia, and treatment depends on the cause.  Take over-the-counter and prescription medicines only as told by your health care   provider.  Follow instructions from your health care provider about eating or drinking restrictions. This information is not intended to replace advice given to you by your health care provider. Make sure you discuss any questions you have with  your health care provider. Document Revised: 11/14/2018 Document Reviewed: 07/24/2018 Elsevier Patient Education  2020 [employer]vier Inc.  

## 2020-05-08 NOTE — Progress Notes (Signed)
Name: ELIANNE GUBSER   MRN: [id number]    DOB: [birth year]   Date:05/08/2020       Progress Note  Chief Complaint  Patient presents with  . Follow-up  . Hyperlipidemia     Subjective:   REVE CROCKET is a 64 y.o. female, presents to clinic for f/up on abnormal labs, high calcium that has been worsening  Lab Results  Component Value Date   CALCIUM 11.3 (H) 04/30/2020   CALCIUM 10.9 (H) 02/05/2020   CALCIUM 10.5 (H) 01/15/2020   CALCIUM 10.4 12/18/2018   Pts calcium was minimally elevated earlier this year, she stopped extra calcium Vitamins, Co-Q-10, stopped some extra supplements and keto fast that had calcium in it.  Vit D was low but since improved, however Calcium has continued to increase, PTH was elevated, never evaluated before.   She rarely has cereal or milk.  She is checking food labels.   Her mother had thyroidectomy when she was young but doesn't know why, pt anxious about thyroid problems, but has never had any abnormal thyroid labs.  No family history of hyperparathyroidism or multiple endocrine neoplasia that she knows of  She has lost some weight, trying to diet, she has a hard time sustaining efforts Wt Readings from Last 5 Encounters:  05/08/20 214 lb 1.6 oz (97.1 kg)  02/05/20 214 lb 11.2 oz (97.4 kg)  01/18/20 215 lb (97.5 kg)  11/06/19 221 lb (100.2 kg)  10/16/19 220 lb (99.8 kg)   BMI Readings from Last 5 Encounters:  05/08/20 39.16 kg/m  02/05/20 39.27 kg/m  01/18/20 39.32 kg/m  11/06/19 40.42 kg/m  10/16/19 40.24 kg/m   Hyperlipidemia: Pt prescribed crestor 10, but she refused to take or try, despite lengthy prior discussion and handouts with ample info Last Lipids: Lab Results  Component Value Date   CHOL 272 (H) 02/05/2020   HDL 44 (L) 02/05/2020   LDLCALC 174 (H) 02/05/2020   TRIG 318 (H) 02/05/2020   CHOLHDL 6.2 (H) 02/05/2020   - Denies: Chest pain, shortness of breath, myalgias, claudication The 10-year ASCVD risk score Mikey Bussing DC Jr.,  et al., 2013) is: 7.6%   Values used to calculate the score:     Age: 34 years     Sex: Female     Is Non-Hispanic African American: No     Diabetic: No     Tobacco smoker: No     Systolic Blood Pressure: 944 mmHg     Is BP treated: No     HDL Cholesterol: 44 mg/dL     Total Cholesterol: 272 mg/dL       Current Outpatient Medications:  .  Apple Cider Vinegar 188 MG CAPS, Take by mouth., Disp: , Rfl:  .  aspirin EC 81 MG tablet, Take 81 mg by mouth daily., Disp: , Rfl:  .  Cholecalciferol (VITAMIN D) 50 MCG (2000 UT) CAPS, Take 1 capsule by mouth daily., Disp: , Rfl:  .  Flaxseed, Linseed, (FLAX SEED OIL) 1000 MG CAPS, Take by mouth., Disp: , Rfl:  .  MAGNESIUM PO, Take by mouth daily., Disp: , Rfl:  .  NIACIN PO, Take by mouth daily., Disp: , Rfl:  .  Omega-3 Fatty Acids (FISH OIL PO), Take by mouth daily., Disp: , Rfl:  .  zinc gluconate 50 MG tablet, Take 50 mg by mouth daily., Disp: , Rfl:  .  Coenzyme Q10 (COQ10 PO), Take by mouth daily. (Patient not taking: Reported on 05/08/2020), Disp: ,  Rfl:  .  Iron-FA-B Cmp-C-Biot-Probiotic (FUSION PLUS) CAPS, Take 1 pill daily (Patient not taking: Reported on 02/05/2020), Disp: 30 capsule, Rfl: 2 .  rosuvastatin (CRESTOR) 10 MG tablet, Take 1 tablet (10 mg total) by mouth daily. (Patient not taking: Reported on 05/08/2020), Disp: 90 tablet, Rfl: 3 .  Vitamin D, Ergocalciferol, (DRISDOL) 1.25 MG (50000 UNIT) CAPS capsule, Take 1 capsule (50,000 Units total) by mouth every 7 (seven) days. x12 weeks. (Patient not taking: Reported on 05/08/2020), Disp: 12 capsule, Rfl: 0  Patient Active Problem List   Diagnosis Date Noted  . Hypercalcemia 02/06/2020  . Vitamin D deficiency 02/06/2020  . Hypertriglyceridemia 02/06/2020  . Cardiac murmur 01/05/2019  . Iron deficiency anemia 01/05/2019  . Hypercholesteremia 01/05/2019  . Obesity (BMI 35.0-39.9 without comorbidity) 12/18/2018  . Family history of high cholesterol 12/18/2018  . Family history of  dementia 12/18/2018    Past Surgical History:  Procedure Laterality Date  . ABDOMINAL HYSTERECTOMY    . CATARACT EXTRACTION W/PHACO Left 07/04/2019   Procedure: CATARACT EXTRACTION PHACO AND INTRAOCULAR LENS PLACEMENT ([place]) left  00:43.2  12.4%  5.40;  Surgeon: Leandrew Koyanagi, MD;  Location: [facility];  Service: Ophthalmology;  Laterality: Left;  . CATARACT EXTRACTION W/PHACO Right 07/25/2019   Procedure: CATARACT EXTRACTION PHACO AND INTRAOCULAR LENS PLACEMENT ([facility]) RIGHT  00:41.9  12.4%  5.18;  Surgeon: Leandrew Koyanagi, MD;  Location: [facility];  Service: Ophthalmology;  Laterality: Right;  . CESAREAN SECTION    . COLONOSCOPY WITH PROPOFOL N/A 01/04/2019   Procedure: COLONOSCOPY WITH PROPOFOL;  Surgeon: Lin Landsman, MD;  Location: [facility] ENDOSCOPY;  Service: Gastroenterology;  Laterality: N/A;  . ESOPHAGOGASTRODUODENOSCOPY (EGD) WITH PROPOFOL N/A 01/04/2019   Procedure: ESOPHAGOGASTRODUODENOSCOPY (EGD) WITH PROPOFOL;  Surgeon: Lin Landsman, MD;  Location: [facility] ENDOSCOPY;  Service: Gastroenterology;  Laterality: N/A;    Family History  Problem Relation Age of Onset  . Hypertension Mother   . Hyperlipidemia Mother   . Dementia Father   . Hypertension Father   . AVM Brother   . Anxiety disorder Son   . Seizures Son   . Dementia Paternal Grandfather   . Hypertension Brother     Social History   Tobacco Use  . Smoking status: Never Smoker  . Smokeless tobacco: Never Used  Vaping Use  . Vaping Use: Never used  Substance Use Topics  . Alcohol use: Not Currently  . Drug use: Not Currently     No Known Allergies  Health Maintenance  Topic Date Due  . DEXA SCAN  Never done  . Hepatitis C Screening  Never done  . TETANUS/TDAP  Never done  . MAMMOGRAM  11/09/2008  . PNA vac Low Risk Adult (1 of 2 - PCV13) 02/04/2021 (Originally 11/02/2018)  . INFLUENZA VACCINE  06/01/2020  . COLONOSCOPY  01/03/2026  . COVID-19 Vaccine  Completed     Chart Review Today: I personally reviewed active problem list, medication list, allergies, family history, social history, health maintenance, notes from last encounter, lab results, imaging with the patient/caregiver today.   Review of Systems  Constitutional: Negative.   HENT: Negative.   Eyes: Negative.   Respiratory: Negative.   Cardiovascular: Negative.  Negative for chest pain, palpitations and leg swelling.  Gastrointestinal: Negative.  Negative for abdominal pain.  Endocrine: Negative.   Genitourinary: Negative.   Musculoskeletal: Negative.  Negative for myalgias.       Denies muscle spasms/twitches  Skin: Negative.  Negative for color change and pallor.  Allergic/Immunologic: Negative.   Neurological: Negative.   Hematological: Negative.   Psychiatric/Behavioral: Negative.  Negative for behavioral problems and dysphoric mood.  All other systems reviewed and are negative.   10 Systems reviewed and are negative for acute change except as noted in the HPI.   Objective:   Vitals:   05/08/20 1324  BP: 126/74  Pulse: 80  Resp: 16  Temp: (!) 97.3 F (36.3 C)  TempSrc: Temporal  SpO2: 98%  Weight: 214 lb 1.6 oz (97.1 kg)  Height: 5\' 2"  (1.575 m)    Body mass index is 39.16 kg/m.  Physical Exam Vitals and nursing note reviewed.  Constitutional:      General: She is not in acute distress.    Appearance: Normal appearance. She is well-developed. She is obese. She is not ill-appearing, toxic-appearing or diaphoretic.  HENT:     Head: Normocephalic and atraumatic.     Right Ear: External ear normal.     Left Ear: External ear normal.  Eyes:     General:        Right eye: No discharge.        Left eye: No discharge.     Conjunctiva/sclera: Conjunctivae normal.  Neck:     Trachea: No tracheal deviation.  Cardiovascular:     Rate and Rhythm: Normal rate and regular rhythm.     Pulses: Normal pulses.     Heart sounds: Normal heart sounds. No murmur heard.   No friction rub. No gallop.   Pulmonary:     Effort: Pulmonary effort is normal. No respiratory distress.     Breath sounds: Normal breath sounds. No stridor.  Abdominal:     General: Bowel sounds are normal. There is no distension.     Palpations: Abdomen is soft.  Musculoskeletal:     Right lower leg: No edema.     Left lower leg: No edema.  Skin:    General: Skin is warm and dry.     Findings: No rash.  Neurological:     Mental Status: She is alert.     Motor: No abnormal muscle tone.     Coordination: Coordination normal.  Psychiatric:        Mood and Affect: Mood normal.        Behavior: Behavior normal.         Assessment & Plan:     ICD-10-CM   1. Hyperlipidemia, unspecified hyperlipidemia type  E78.5 rosuvastatin (CRESTOR) 10 MG tablet    COMPLETE METABOLIC PANEL WITH GFR    Lipid panel   discussed again at length HLD, ASCVD, benefit and SE of statin, encouraged her to try crestor, keep working on diet exercise, f/up 3-4 months  2. Hypercalcemia  E83.52 Ambulatory referral to Endocrinology  3. Hyperparathyroidism ([place])  E21.3 Ambulatory referral to Endocrinology   calcium elevated, worsening, PTH high, suspect primary HPT - refer to endo   4. Medication monitoring encounter  Z51.81 TSH    COMPLETE METABOLIC PANEL WITH GFR    Lipid panel    Hepatitis C antibody   follow up fasting labs for prior to next appt, if pt is willing to start statin  5. Class 2 severe obesity with serious comorbidity and body mass index (BMI) of 39.0 to 39.9 in adult, unspecified obesity type (HCC)  E66.01 TSH   Z61.09 COMPLETE METABOLIC PANEL WITH GFR    Lipid panel   difficulty loosing weight, associated comorbidity HLD, some weight loss, encouraged continued efforts decrease calories/portions increase  activity  6. Encounter for hepatitis C screening test for low risk patient  Z11.59 Hepatitis C antibody   Reviewed hypercalcemia and hyperparathyroid/some basic physiology with pt  today - she is comfortable with f/up with endo. Currently asx Given handout on high calcium - did tell her that I wanted specialist to confirm dx of primary hyperparathyroid  She sounds like she may try statin - asked that she get 3-4 month f/up visit to review her tolerance and repeat labs - pt agreed. Reviewed ASCVD risk, benefits of statin meds, most common SE, she is most concerned about gaining weight and she has worked her "whole life" to stay off meds.  I encouraged her to consider taking for perhaps the next decade to help lower her risk of ASCVD - explained we will monitor sx/se and try to discontinue meds when elderly and when med SE outweigh the benefit.  All questions asked and answered.  Return for 3-4 month f/up after starting statin med for HLD.   Delsa Grana, PA-C 05/08/20 1:43 PM

## 2020-07-22 ENCOUNTER — Ambulatory Visit: Payer: Medicare HMO | Admitting: Endocrinology

## 2020-07-22 ENCOUNTER — Other Ambulatory Visit: Payer: Self-pay

## 2020-07-22 ENCOUNTER — Encounter: Payer: Self-pay | Admitting: Endocrinology

## 2020-07-22 DIAGNOSIS — E213 Hyperparathyroidism, unspecified: Secondary | ICD-10-CM | POA: Diagnosis not present

## 2020-07-22 DIAGNOSIS — E210 Primary hyperparathyroidism: Secondary | ICD-10-CM | POA: Insufficient documentation

## 2020-07-22 LAB — PHOSPHORUS: Phosphorus: 3.1 mg/dL (ref 2.3–4.6)

## 2020-07-22 LAB — VITAMIN D 25 HYDROXY (VIT D DEFICIENCY, FRACTURES): VITD: 24.92 ng/mL — ABNORMAL LOW (ref 30.00–100.00)

## 2020-07-22 NOTE — Progress Notes (Signed)
Subjective:    Patient ID: Danielle Le, female    DOB: Oct 25, 1956, 64 y.o.   MRN: [id number]  HPI Pt is referred by Delsa Grana, PA, for hypercalcemia.  Pt was noted to have hypercalcemia in 2020.  she has never had osteoporosis, urolithiasis, thyroid probs, sarcoidosis, cancer, PUD, pancreatitis, depression, or bony fracture.  she does not take vitamin-A supplement.  She takes Vit-D, 2000 units/d. Pt denies taking antacids, Li++, or HCTZ.  Past Medical History:  Diagnosis Date  . Anemia   . Cataract     Past Surgical History:  Procedure Laterality Date  . ABDOMINAL HYSTERECTOMY    . CATARACT EXTRACTION W/PHACO Left 07/04/2019   Procedure: CATARACT EXTRACTION PHACO AND INTRAOCULAR LENS PLACEMENT ([place]) left  00:43.2  12.4%  5.40;  Surgeon: Leandrew Koyanagi, MD;  Location: [facility];  Service: Ophthalmology;  Laterality: Left;  . CATARACT EXTRACTION W/PHACO Right 07/25/2019   Procedure: CATARACT EXTRACTION PHACO AND INTRAOCULAR LENS PLACEMENT ([facility]) RIGHT  00:41.9  12.4%  5.18;  Surgeon: Leandrew Koyanagi, MD;  Location: [facility];  Service: Ophthalmology;  Laterality: Right;  . CESAREAN SECTION    . COLONOSCOPY WITH PROPOFOL N/A 01/04/2019   Procedure: COLONOSCOPY WITH PROPOFOL;  Surgeon: Lin Landsman, MD;  Location[facility] ENDOSCOPY;  Service: Gastroenterology;  Laterality: N/A;  . ESOPHAGOGASTRODUODENOSCOPY (EGD) WITH PROPOFOL N/A 01/04/2019   Procedure: ESOPHAGOGASTRODUODENOSCOPY (EGD) WITH PROPOFOL;  Surgeon: Lin Landsman, MD;  Location: [facility] ENDOSCOPY;  Service: Gastroenterology;  Laterality: N/A;    Social History   Socioeconomic History  . Marital status: Divorced    Spouse name: Not on file  . Number of children: 1  . Years of education: 81  . Highest education level: Associate degree: occupational, Hotel manager, or vocational program  Occupational History  . Occupation: stay home mom  Tobacco Use  . Smoking status: Never Smoker  . Smokeless  tobacco: Never Used  Vaping Use  . Vaping Use: Never used  Substance and Sexual Activity  . Alcohol use: Not Currently  . Drug use: Not Currently  . Sexual activity: Not Currently  Other Topics Concern  . Not on file  Social History Narrative   Pt has 66 year old special needs son that lives at home who is non ambulatory/non verbal and she is primary caregiver.    Social Determinants of Health   Financial Resource Strain: Low Risk   . Difficulty of Paying Living Expenses: Not very hard  Food Insecurity: No Food Insecurity  . Worried About Charity fundraiser in the Last Year: Never true  . Ran Out of Food in the Last Year: Never true  Transportation Needs: No Transportation Needs  . Lack of Transportation (Medical): No  . Lack of Transportation (Non-Medical): No  Physical Activity: Inactive  . Days of Exercise per Week: 0 days  . Minutes of Exercise per Session: 0 min  Stress: Stress Concern Present  . Feeling of Stress : To some extent  Social Connections: Socially Isolated  . Frequency of Communication with Friends and Family: More than three times a week  . Frequency of Social Gatherings with Friends and Family: Never  . Attends Religious Services: Never  . Active Member of Clubs or Organizations: No  . Attends Archivist Meetings: Never  . Marital Status: Divorced  Human resources officer Violence: Not At Risk  . Fear of Current or Ex-Partner: No  . Emotionally Abused: No  . Physically Abused: No  . Sexually Abused: No  Current Outpatient Medications on File Prior to Visit  Medication Sig Dispense Refill  . Apple Cider Vinegar 188 MG CAPS Take 1 capsule by mouth daily.     Marland Kitchen aspirin EC 81 MG tablet Take 81 mg by mouth daily.    . Cholecalciferol (VITAMIN D) 50 MCG (2000 UT) CAPS Take 1 capsule by mouth daily.    . Flaxseed, Linseed, (FLAX SEED OIL) 1000 MG CAPS Take 1 capsule by mouth daily.     Marland Kitchen MAGNESIUM PO Take 1 tablet by mouth daily.     Marland Kitchen NIACIN PO Take  1 tablet by mouth daily.     . Omega-3 Fatty Acids (FISH OIL PO) Take by mouth daily.    Marland Kitchen omeprazole (PRILOSEC) 10 MG capsule Take 10 mg by mouth daily.    . rosuvastatin (CRESTOR) 10 MG tablet Take 1 tablet (10 mg total) by mouth daily. 90 tablet 3  . zinc gluconate 50 MG tablet Take 50 mg by mouth daily.     No current facility-administered medications on file prior to visit.    No Known Allergies  Family History  Problem Relation Age of Onset  . Hypertension Mother   . Hyperlipidemia Mother   . Dementia Father   . Hypertension Father   . AVM Brother   . Anxiety disorder Son   . Seizures Son   . Dementia Paternal Grandfather   . Hypertension Brother   . Hypercalcemia Neg Hx     BP 124/80   Pulse 66   Ht 5\' 2"  (1.575 m)   Wt 213 lb 3.2 oz (96.7 kg)   SpO2 98%   BMI 38.99 kg/m    Review of Systems denies weight loss, chest pain, sob, n/v, urinary frequency, memory loss, and depression.     Objective:   Physical Exam VITAL SIGNS:  See vs page GENERAL: no distress NECK: There is no palpable thyroid enlargement.  No thyroid nodule is palpable.  No palpable lymphadenopathy at the anterior neck. SPINE: no kyphosis GAIT: normal and steady.    25-OH vit-D=40  Lab Results  Component Value Date   PTH 106 (H) 04/30/2020   CALCIUM 11.3 (H) 04/30/2020   I have reviewed outside records, and summarized: Pt was noted to have elevated Ca++, and referred here.  Obesity and dyslipidemia were also addressed.       Assessment & Plan:  Hyperparathyroidism, uncertain etiology and prognosis.   Vit-D def: uncontrolled.  Pt is advised to increase to 5000 units/d.    Patient Instructions  Blood tests are requested for you today.  We'll let you know about the results.  Let's check the bone density.  you will receive a phone call, about a day and time for an appointment.  Please come back for a follow-up appointment in 6 months.

## 2020-07-22 NOTE — Patient Instructions (Addendum)
Blood tests are requested for you today.  We'll let you know about the results.   Let's check the bone density.  you will receive a phone call, about a day and time for an appointment.   Please come back for a follow-up appointment in 6 months.    

## 2020-07-31 LAB — PROTEIN ELECTROPHORESIS, SERUM
Albumin ELP: 4.3 g/dL (ref 3.8–4.8)
Alpha 1: 0.3 g/dL (ref 0.2–0.3)
Alpha 2: 0.7 g/dL (ref 0.5–0.9)
Beta 2: 0.4 g/dL (ref 0.2–0.5)
Beta Globulin: 0.5 g/dL (ref 0.4–0.6)
Gamma Globulin: 1.1 g/dL (ref 0.8–1.7)
Total Protein: 7.3 g/dL (ref 6.1–8.1)

## 2020-07-31 LAB — PTH, INTACT AND CALCIUM
Calcium: 10.9 mg/dL — ABNORMAL HIGH (ref 8.6–10.4)
PTH: 129 pg/mL — ABNORMAL HIGH (ref 14–64)

## 2020-07-31 LAB — VITAMIN A: Vitamin A (Retinoic Acid): 43 ug/dL (ref 38–98)

## 2020-07-31 LAB — VITAMIN D 1,25 DIHYDROXY
Vitamin D 1, 25 (OH)2 Total: 74 pg/mL — ABNORMAL HIGH (ref 18–72)
Vitamin D2 1, 25 (OH)2: 20 pg/mL
Vitamin D3 1, 25 (OH)2: 54 pg/mL

## 2020-07-31 LAB — PTH-RELATED PEPTIDE: PTH-Related Protein (PTH-RP): 13 pg/mL (ref 11–20)

## 2020-09-08 ENCOUNTER — Ambulatory Visit (INDEPENDENT_AMBULATORY_CARE_PROVIDER_SITE_OTHER): Payer: Medicare HMO | Admitting: Family Medicine

## 2020-09-08 ENCOUNTER — Other Ambulatory Visit: Payer: Self-pay

## 2020-09-08 ENCOUNTER — Encounter: Payer: Self-pay | Admitting: Family Medicine

## 2020-09-08 VITALS — BP 124/76 | HR 83 | Temp 98.1°F | Resp 18 | Ht 62.0 in | Wt 214.8 lb

## 2020-09-08 DIAGNOSIS — Z6839 Body mass index (BMI) 39.0-39.9, adult: Secondary | ICD-10-CM

## 2020-09-08 DIAGNOSIS — D649 Anemia, unspecified: Secondary | ICD-10-CM | POA: Diagnosis not present

## 2020-09-08 DIAGNOSIS — E785 Hyperlipidemia, unspecified: Secondary | ICD-10-CM | POA: Diagnosis not present

## 2020-09-08 DIAGNOSIS — Z5181 Encounter for therapeutic drug level monitoring: Secondary | ICD-10-CM

## 2020-09-08 DIAGNOSIS — E781 Pure hyperglyceridemia: Secondary | ICD-10-CM

## 2020-09-08 DIAGNOSIS — E559 Vitamin D deficiency, unspecified: Secondary | ICD-10-CM

## 2020-09-08 DIAGNOSIS — E213 Hyperparathyroidism, unspecified: Secondary | ICD-10-CM | POA: Diagnosis not present

## 2020-09-08 NOTE — Progress Notes (Signed)
Name: SAMANTHAMARIE EZZELL   MRN: [id number]    DOB: 20-Dominick-1957   Date:09/08/2020       Progress Note  Chief Complaint  Patient presents with  . Hyperlipidemia     Subjective:   MARIJO QUIZON is a 64 y.o. female, presents to clinic for routine f/up   Hyperparathyroidism - pt has seen endocrinology since our last routine OV in primary care - reviewed OV with Dr. Loanne Drilling Per Dr. Loanne Drilling: Hyperparathyroidism, uncertain etiology and prognosis.   Vit-D def: uncontrolled.  Pt is advised to increase to 5000 units/d.   Reviewed OV and labs with pt today Lab Results  Component Value Date   CALCIUM 10.9 (H) 07/22/2020   CALCIUM 11.3 (H) 04/30/2020   CALCIUM 10.9 (H) 02/05/2020   CALCIUM 10.5 (H) 01/15/2020  on 4000 IU vit d, stopped calcium supplements, stopped co-Q-10 Pts calcium was minimally elevated earlier this year, she stopped extra calcium Vitamins, Co-Q-10, stopped some extra supplements and keto fast that had calcium in it.  Vit D was low but since improved, however Calcium has continued to increase, PTH was elevated, never evaluated before.   She rarely has cereal or milk.  She is checking food labels.   Her mother had thyroidectomy when she was young but doesn't know why, pt anxious about thyroid problems, but has never had any abnormal thyroid labs.  No family history of hyperparathyroidism or multiple endocrine neoplasia that she knows of  Obesity:  BMI today 39, weight fairly steady of the past year Wt Readings from Last 5 Encounters:  09/08/20 214 lb 12.8 oz (97.4 kg)  07/22/20 213 lb 3.2 oz (96.7 kg)  05/08/20 214 lb 1.6 oz (97.1 kg)  02/05/20 214 lb 11.2 oz (97.4 kg)  01/18/20 215 lb (97.5 kg)   BMI Readings from Last 5 Encounters:  09/08/20 39.29 kg/m  07/22/20 38.99 kg/m  05/08/20 39.16 kg/m  02/05/20 39.27 kg/m  01/18/20 39.32 kg/m   Hyperlipidemia: Pt prescribed crestor 10 - pt started after July visit, good daily compliance, no SE or concerns so far Here for  recheck  Lipids: Lab Results  Component Value Date   CHOL 272 (H) 02/05/2020   HDL 44 (L) 02/05/2020   LDLCALC 174 (H) 02/05/2020   TRIG 318 (H) 02/05/2020   CHOLHDL 6.2 (H) 02/05/2020   - Denies: Chest pain, shortness of breath, myalgias, claudication The 10-year ASCVD risk score Mikey Bussing DC Jr., et al., 2013) is: 7.3%   Values used to calculate the score:     Age: 73 years     Sex: Female     Is Non-Hispanic African American: No     Diabetic: No     Tobacco smoker: No     Systolic Blood Pressure: 545 mmHg     Is BP treated: No     HDL Cholesterol: 44 mg/dL     Total Cholesterol: 272 mg/dL       Current Outpatient Medications:  .  Apple Cider Vinegar 188 MG CAPS, Take 1 capsule by mouth daily. , Disp: , Rfl:  .  aspirin EC 81 MG tablet, Take 81 mg by mouth daily., Disp: , Rfl:  .  Cholecalciferol (VITAMIN D) 50 MCG (2000 UT) CAPS, Take 1 capsule by mouth daily., Disp: , Rfl:  .  Flaxseed, Linseed, (FLAX SEED OIL) 1000 MG CAPS, Take 1 capsule by mouth daily. , Disp: , Rfl:  .  MAGNESIUM PO, Take 1 tablet by mouth daily. , Disp: ,  Rfl:  .  NIACIN PO, Take 1 tablet by mouth daily. , Disp: , Rfl:  .  Omega-3 Fatty Acids (FISH OIL PO), Take by mouth daily., Disp: , Rfl:  .  omeprazole (PRILOSEC) 10 MG capsule, Take 10 mg by mouth daily., Disp: , Rfl:  .  rosuvastatin (CRESTOR) 10 MG tablet, Take 1 tablet (10 mg total) by mouth daily., Disp: 90 tablet, Rfl: 3 .  zinc gluconate 50 MG tablet, Take 50 mg by mouth daily., Disp: , Rfl:   Patient Active Problem List   Diagnosis Date Noted  . Hyperparathyroidism ([place]) 07/22/2020  . Hypercalcemia 02/06/2020  . Vitamin D deficiency 02/06/2020  . Hypertriglyceridemia 02/06/2020  . Cardiac murmur 01/05/2019  . Iron deficiency anemia 01/05/2019  . Hypercholesteremia 01/05/2019  . Obesity (BMI 35.0-39.9 without comorbidity) 12/18/2018  . Family history of high cholesterol 12/18/2018  . Family history of dementia 12/18/2018    Past  Surgical History:  Procedure Laterality Date  . ABDOMINAL HYSTERECTOMY    . CATARACT EXTRACTION W/PHACO Left 07/04/2019   Procedure: CATARACT EXTRACTION PHACO AND INTRAOCULAR LENS PLACEMENT ([place]) left  00:43.2  12.4%  5.40;  Surgeon: Leandrew Koyanagi, MD;  Location: [place];  Service: Ophthalmology;  Laterality: Left;  . CATARACT EXTRACTION W/PHACO Right 07/25/2019   Procedure: CATARACT EXTRACTION PHACO AND INTRAOCULAR LENS PLACEMENT (I[facility]) RIGHT  00:41.9  12.4%  5.18;  Surgeon: Leandrew Koyanagi, MD;  Location: [place];  Service: Ophthalmology;  Laterality: Right;  . CESAREAN SECTION    . COLONOSCOPY WITH PROPOFOL N/A 01/04/2019   Procedure: COLONOSCOPY WITH PROPOFOL;  Surgeon: Lin Landsman, MD;  Location: [facility] ENDOSCOPY;  Service: Gastroenterology;  Laterality: N/A;  . ESOPHAGOGASTRODUODENOSCOPY (EGD) WITH PROPOFOL N/A 01/04/2019   Procedure: ESOPHAGOGASTRODUODENOSCOPY (EGD) WITH PROPOFOL;  Surgeon: Lin Landsman, MD;  Location[facility] ENDOSCOPY;  Service: Gastroenterology;  Laterality: N/A;    Family History  Problem Relation Age of Onset  . Hypertension Mother   . Hyperlipidemia Mother   . Dementia Father   . Hypertension Father   . AVM Brother   . Anxiety disorder Son   . Seizures Son   . Dementia Paternal Grandfather   . Hypertension Brother   . Hypercalcemia Neg Hx     Social History   Tobacco Use  . Smoking status: Never Smoker  . Smokeless tobacco: Never Used  Vaping Use  . Vaping Use: Never used  Substance Use Topics  . Alcohol use: Not Currently  . Drug use: Not Currently     No Known Allergies  Health Maintenance  Topic Date Due  . DEXA SCAN  Never done  . Hepatitis C Screening  Never done  . TETANUS/TDAP  Never done  . MAMMOGRAM  11/09/2008  . INFLUENZA VACCINE  01/29/2021 (Originally 06/01/2020)  . PNA vac Low Risk Adult (1 of 2 - PCV13) 02/04/2021 (Originally 11/02/2018)  . COLONOSCOPY  01/03/2026  . COVID-19 Vaccine   Completed    Chart Review Today: I personally reviewed active problem list, medication list, allergies, family history, social history, health maintenance, notes from last encounter, lab results, imaging with the patient/caregiver today.   ROS: 10 Systems reviewed and are negative for acute change except as noted in the HPI.     Objective:   Vitals:   09/08/20 1314  BP: 124/76  Pulse: 83  Resp: 18  Temp: 98.1 F (36.7 C)  TempSrc: Oral  SpO2: 98%  Weight: 214 lb 12.8 oz (97.4 kg)  Height: 5\' 2"  (  1.575 m)    Body mass index is 39.29 kg/m.  Physical Exam Vitals and nursing note reviewed.  Constitutional:      General: She is not in acute distress.    Appearance: Normal appearance. She is well-developed. She is obese. She is not ill-appearing, toxic-appearing or diaphoretic.     Interventions: Face mask in place.  HENT:     Head: Normocephalic and atraumatic.     Right Ear: External ear normal.     Left Ear: External ear normal.  Eyes:     General: Lids are normal. No scleral icterus.       Right eye: No discharge.        Left eye: No discharge.     Conjunctiva/sclera: Conjunctivae normal.  Neck:     Trachea: Phonation normal. No tracheal deviation.  Cardiovascular:     Rate and Rhythm: Normal rate and regular rhythm.     Pulses: Normal pulses.          Radial pulses are 2+ on the right side and 2+ on the left side.       Posterior tibial pulses are 2+ on the right side and 2+ on the left side.     Heart sounds: Normal heart sounds. No murmur heard.  No friction rub. No gallop.   Pulmonary:     Effort: Pulmonary effort is normal. No respiratory distress.     Breath sounds: Normal breath sounds. No stridor. No wheezing, rhonchi or rales.  Chest:     Chest wall: No tenderness.  Abdominal:     Palpations: Abdomen is soft.  Musculoskeletal:     Right lower leg: No edema.     Left lower leg: No edema.  Skin:    General: Skin is warm and dry.     Coloration:  Skin is not jaundiced or pale.  Neurological:     Mental Status: She is alert.     Motor: No abnormal muscle tone.     Gait: Gait normal.  Psychiatric:        Mood and Affect: Mood normal.        Speech: Speech normal.        Behavior: Behavior normal.     Comments: Slightly anxious appearing         Assessment & Plan:     ICD-10-CM   1. Hyperlipidemia, unspecified hyperlipidemia type  B14.7 COMPLETE METABOLIC PANEL WITH GFR    Lipid panel   Pt started statin 4 months ago, no SE or concerns, denies myalgias fatigue, she needs to return for fasting labs due to high trigs  2. Class 2 severe obesity with serious comorbidity and body mass index (BMI) of 39.0 to 39.9 in adult, unspecified obesity type ([place])  E66.01    Z68.39   3. Hyperparathyroidism ([place])  E21.3    per endocrinology - spent large portion of visit today reviewing her labs and initial consult with Dr. Ellison/endocrinology   4. Vitamin D deficiency  E55.9    per endocrinology, he advised 5000 IU daily, she has been able to find 2000 and is taking 4000 iu daily  5. Hypercalcemia  W29.56 COMPLETE METABOLIC PANEL WITH GFR   Pt has consulted with endocrinology - Dr. Loanne Drilling, he increased vit D, calcium was 10.9, PTH high - she has f/up in a few months  6. Anemia, unspecified type  D64.9 CBC with Differential/Platelet   last year anemia and iron deficiency improved, last H/H normal [place] 2021, recheck  7. Medication monitoring encounter  Z51.81   8. Hypertriglyceridemia  E78.1    hope to see decrease in trigs with taking statin, discussed dietary changes that can help lower trigs, advised her to avoid ETOH   Pt to return for fasting labs  Return in about 6 months (around 03/08/2021) for Routine follow-up.   Delsa Grana, PA-C 09/08/20 1:21 PM

## 2020-09-08 NOTE — Patient Instructions (Signed)
Return for fasting labs in the next couple weeks  Lab starts at 8 am Just come into the lobby and tell them you're there for labs - they will pull your orders and get you back to the lab    High Triglycerides Eating Plan Triglycerides are a type of fat in the blood. High levels of triglycerides can increase your risk of heart disease and stroke. If your triglyceride levels are high, choosing the right foods can help lower your triglycerides and keep your heart healthy. Work with your health care provider or a diet and nutrition specialist (dietitian) to develop an eating plan that is right for you. What are tips for following this plan? General guidelines   Lose weight, if you are overweight. For most people, losing 5-10 lbs (2-5 kg) helps lower triglyceride levels. A weight-loss plan may include. ? 30 minutes of exercise at least 5 days a week. ? Reducing the amount of calories, sugar, and fat you eat.  Eat a wide variety of fresh fruits, vegetables, and whole grains. These foods are high in fiber.  Eat foods that contain healthy fats, such as fatty fish, nuts, seeds, and olive oil.  Avoid foods that are high in added sugar, added salt (sodium), saturated fat, and trans fat.  Avoid low-fiber, refined carbohydrates such as white bread, crackers, noodles, and white rice.  Avoid foods with partially hydrogenated oils (trans fats), such as fried foods or stick margarine.  Limit alcohol intake to no more than 1 drink a day for nonpregnant women and 2 drinks a day for men. One drink equals 12 oz of beer, 5 oz of wine, or 1 oz of hard liquor. Your health care provider may recommend that you drink less depending on your overall health. Reading food labels  Check food labels for the amount of saturated fat. Choose foods with no or very little saturated fat.  Check food labels for the amount of trans fat. Choose foods with no trans fat.  Check food labels for the amount of cholesterol.  Choose foods low in cholesterol. Ask your dietitian how much cholesterol you should have each day.  Check food labels for the amount of sodium. Choose foods with less than 140 milligrams (mg) per serving. Shopping  Buy dairy products labeled as nonfat (skim) or low-fat (1%).  Avoid buying processed or prepackaged foods. These are often high in added sugar, sodium, and fat. Cooking  Choose healthy fats when cooking, such as olive oil or canola oil.  Cook foods using lower fat methods, such as baking, broiling, boiling, or grilling.  Make your own sauces, dressings, and marinades when possible, instead of buying them. Store-bought sauces, dressings, and marinades are often high in sodium and sugar. Meal planning  Eat more home-cooked food and less restaurant, buffet, and fast food.  Eat fatty fish at least 2 times each week. Examples of fatty fish include salmon, trout, mackerel, tuna, and herring.  If you eat whole eggs, do not eat more than 3 egg yolks per week. What foods are recommended? The items listed may not be a complete list. Talk with your dietitian about what dietary choices are best for you. Grains Whole wheat or whole grain breads, crackers, cereals, and pasta. Unsweetened oatmeal. Bulgur. Barley. Quinoa. Brown rice. Whole wheat flour tortillas. Vegetables Fresh or frozen vegetables. Low-sodium canned vegetables. Fruits All fresh, canned (in natural juice), or frozen fruits. Meats and other protein foods Skinless chicken or [place]. Ground chicken or [place]. Lean cuts  of pork, trimmed of fat. Fish and seafood, especially salmon, trout, and herring. Egg whites. Dried beans, peas, or lentils. Unsalted nuts or seeds. Unsalted canned beans. Natural peanut or almond butter. Dairy Low-fat dairy products. Skim or low-fat (1%) milk. Reduced fat (2%) and low-sodium cheese. Low-fat ricotta cheese. Low-fat cottage cheese. Plain, low-fat yogurt. Fats and oils Tub margarine without  trans fats. Light or reduced-fat mayonnaise. Light or reduced-fat salad dressings. Avocado. Safflower, olive, sunflower, soybean, and canola oils. What foods are not recommended? The items listed may not be a complete list. Talk with your dietitian about what dietary choices are best for you. Grains White bread. White (regular) pasta. White rice. Cornbread. Bagels. Pastries. Crackers that contain trans fat. Vegetables Creamed or fried vegetables. Vegetables in a cheese sauce. Fruits Sweetened dried fruit. Canned fruit in syrup. Fruit juice. Meats and other protein foods Fatty cuts of meat. Ribs. Chicken wings. Berniece Salines. Sausage. Bologna. Salami. Chitterlings. Fatback. Hot dogs. Bratwurst. Packaged lunch meats. Dairy Whole or reduced-fat (2%) milk. Half-and-half. Cream cheese. Full-fat or sweetened yogurt. Full-fat cheese. Nondairy creamers. Whipped toppings. Processed cheese or cheese spreads. Cheese curds. Beverages Alcohol. Sweetened drinks, such as soda, lemonade, fruit drinks, or punches. Fats and oils Butter. Stick margarine. Lard. Shortening. Ghee. Bacon fat. Tropical oils, such as coconut, palm kernel, or palm oils. Sweets and desserts Corn syrup. Sugars. Honey. Molasses. Candy. Jam and jelly. Syrup. Sweetened cereals. Cookies. Pies. Cakes. Donuts. Muffins. Ice cream. Condiments Store-bought sauces, dressings, and marinades that are high in sugar, such as ketchup and barbecue sauce. Summary  High levels of triglycerides can increase the risk of heart disease and stroke. Choosing the right foods can help lower your triglycerides.  Eat plenty of fresh fruits, vegetables, and whole grains. Choose low-fat dairy and lean meats. Eat fatty fish at least twice a week.  Avoid processed and prepackaged foods with added sugar, sodium, saturated fat, and trans fat.  If you need suggestions or have questions about what types of food are good for you, talk with your health care provider or a  dietitian. This information is not intended to replace advice given to you by your health care provider. Make sure you discuss any questions you have with your health care provider. Document Revised: 09/30/2017 Document Reviewed: 12/21/2016 Elsevier Patient Education  2020 [employer].

## 2020-10-02 DIAGNOSIS — D649 Anemia, unspecified: Secondary | ICD-10-CM | POA: Diagnosis not present

## 2020-10-02 DIAGNOSIS — E785 Hyperlipidemia, unspecified: Secondary | ICD-10-CM | POA: Diagnosis not present

## 2020-10-03 LAB — LIPID PANEL
Cholesterol: 147 mg/dL (ref <200)
HDL: 52 mg/dL (ref >=50)
LDL Cholesterol (Calc): 72 mg/dL (calc)
Non-HDL Cholesterol (Calc): 95 mg/dL (calc) (ref <130)
Total CHOL/HDL Ratio: 2.8 (calc) (ref <5.0)
Triglycerides: 152 mg/dL — ABNORMAL HIGH (ref <150)

## 2020-10-03 LAB — CBC WITH DIFFERENTIAL/PLATELET
Absolute Monocytes: 495 cells/uL (ref 200–950)
Basophils Absolute: 32 cells/uL (ref 0–200)
Basophils Relative: 0.7 %
Eosinophils Absolute: 230 cells/uL (ref 15–500)
Eosinophils Relative: 5.1 %
HCT: 40.3 % (ref 35.0–45.0)
Hemoglobin: 13.2 g/dL (ref 11.7–15.5)
Lymphs Abs: 2061 cells/uL (ref 850–3900)
MCH: 26.6 pg — ABNORMAL LOW (ref 27.0–33.0)
MCHC: 32.8 g/dL (ref 32.0–36.0)
MCV: 81.1 fL (ref 80.0–100.0)
MPV: 10.5 fL (ref 7.5–12.5)
Monocytes Relative: 11 %
Neutro Abs: 1683 cells/uL (ref 1500–7800)
Neutrophils Relative %: 37.4 %
Platelets: 242 10*3/uL (ref 140–400)
RBC: 4.97 10*6/uL (ref 3.80–5.10)
RDW: 13.3 % (ref 11.0–15.0)
Total Lymphocyte: 45.8 %
WBC: 4.5 10*3/uL (ref 3.8–10.8)

## 2020-10-03 LAB — COMPLETE METABOLIC PANEL WITH GFR
AG Ratio: 1.5 (calc) (ref 1.0–2.5)
ALT: 18 U/L (ref 6–29)
AST: 23 U/L (ref 10–35)
Albumin: 4.2 g/dL (ref 3.6–5.1)
Alkaline phosphatase (APISO): 118 U/L (ref 37–153)
BUN: 9 mg/dL (ref 7–25)
CO2: 25 mmol/L (ref 20–32)
Calcium: 10.8 mg/dL — ABNORMAL HIGH (ref 8.6–10.4)
Chloride: 106 mmol/L (ref 98–110)
Creat: 0.64 mg/dL (ref 0.50–0.99)
GFR, Est African American: 108 mL/min/{1.73_m2} (ref >=60)
GFR, Est Non African American: 93 mL/min/{1.73_m2} (ref >=60)
Globulin: 2.8 g/dL (calc) (ref 1.9–3.7)
Glucose, Bld: 85 mg/dL (ref 65–99)
Potassium: 4.3 mmol/L (ref 3.5–5.3)
Sodium: 142 mmol/L (ref 135–146)
Total Bilirubin: 0.7 mg/dL (ref 0.2–1.2)
Total Protein: 7 g/dL (ref 6.1–8.1)

## 2020-12-09 DIAGNOSIS — H26492 Other secondary cataract, left eye: Secondary | ICD-10-CM | POA: Diagnosis not present

## 2020-12-09 DIAGNOSIS — H35371 Puckering of macula, right eye: Secondary | ICD-10-CM | POA: Diagnosis not present

## 2021-01-19 ENCOUNTER — Ambulatory Visit: Payer: Medicare HMO | Admitting: Endocrinology

## 2021-01-19 ENCOUNTER — Other Ambulatory Visit: Payer: Self-pay

## 2021-01-19 VITALS — BP 160/84 | HR 72 | Ht 63.0 in | Wt 218.6 lb

## 2021-01-19 DIAGNOSIS — E213 Hyperparathyroidism, unspecified: Secondary | ICD-10-CM | POA: Diagnosis not present

## 2021-01-19 LAB — VITAMIN D 25 HYDROXY (VIT D DEFICIENCY, FRACTURES): VITD: 52.28 ng/mL (ref 30.00–100.00)

## 2021-01-19 NOTE — Patient Instructions (Addendum)
Blood tests are requested for you today.  We'll let you know about the results.   Let's check the bone density.  you will receive a phone call, about a day and time for an appointment.   Please come back for a follow-up appointment in 6 months.

## 2021-01-19 NOTE — Progress Notes (Unsigned)
Subjective:    Patient ID: Danielle Le, female    DOB: 10/06/1956, 65 y.o.   MRN: [id number]  HPI  Pt returns for f/u of hypercalcemia (dx'ed 2020; she has never had osteoporosis, urolithiasis, or bony fracture; PTH is high, and Vit-D is low; she takes Vit-D, 5000 units/d).   She takes Vit-D as rx'ed.  Past Medical History:  Diagnosis Date  . Anemia   . Cataract     Past Surgical History:  Procedure Laterality Date  . ABDOMINAL HYSTERECTOMY    . CATARACT EXTRACTION W/PHACO Left 07/04/2019   Procedure: CATARACT EXTRACTION PHACO AND INTRAOCULAR LENS PLACEMENT ([place]) left  00:43.2  12.4%  5.40;  Surgeon: Leandrew Koyanagi, MD;  Location: [place];  Service: Ophthalmology;  Laterality: Left;  . CATARACT EXTRACTION W/PHACO Right 07/25/2019   Procedure: CATARACT EXTRACTION PHACO AND INTRAOCULAR LENS PLACEMENT (IOC) RIGHT  00:41.9  12.4%  5.18;  Surgeon: Leandrew Koyanagi, MD;  Location: [place];  Service: Ophthalmology;  Laterality: Right;  . CESAREAN SECTION    . COLONOSCOPY WITH PROPOFOL N/A 01/04/2019   Procedure: COLONOSCOPY WITH PROPOFOL;  Surgeon: Lin Landsman, MD;  [facility] ENDOSCOPY;  Service: Gastroenterology;  Laterality: N/A;  . ESOPHAGOGASTRODUODENOSCOPY (EGD) WITH PROPOFOL N/A 01/04/2019   Procedure: ESOPHAGOGASTRODUODENOSCOPY (EGD) WITH PROPOFOL;  Surgeon: Lin Landsman, MD;  Location: [facility] ENDOSCOPY;  Service: Gastroenterology;  Laterality: N/A;    Social History   Socioeconomic History  . Marital status: Divorced    Spouse name: Not on file  . Number of children: 1  . Years of education: 44  . Highest education level: Associate degree: occupational, Hotel manager, or vocational program  Occupational History  . Occupation: stay home mom  Tobacco Use  . Smoking status: Never Smoker  . Smokeless tobacco: Never Used  Vaping Use  . Vaping Use: Never used  Substance and Sexual Activity  . Alcohol use: Not Currently  . Drug use: Not  Currently  . Sexual activity: Not Currently  Other Topics Concern  . Not on file  Social History Narrative   Pt has 67 year old special needs son that lives at home who is non ambulatory/non verbal and she is primary caregiver.    Social Determinants of Health   Financial Resource Strain: Not on file  Food Insecurity: Not on file  Transportation Needs: Not on file  Physical Activity: Not on file  Stress: Not on file  Social Connections: Not on file  Intimate Partner Violence: Not on file    Current Outpatient Medications on File Prior to Visit  Medication Sig Dispense Refill  . Apple Cider Vinegar 188 MG CAPS Take 1 capsule by mouth daily.     Marland Kitchen aspirin EC 81 MG tablet Take 81 mg by mouth daily.    . Cholecalciferol (VITAMIN D) 50 MCG (2000 UT) CAPS Take 1 capsule by mouth daily.    . Flaxseed, Linseed, (FLAX SEED OIL) 1000 MG CAPS Take 1 capsule by mouth daily.     Marland Kitchen MAGNESIUM PO Take 1 tablet by mouth daily.     Marland Kitchen NIACIN PO Take 1 tablet by mouth daily.     . Omega-3 Fatty Acids (FISH OIL PO) Take by mouth daily.    Marland Kitchen omeprazole (PRILOSEC) 10 MG capsule Take 10 mg by mouth daily.    . rosuvastatin (CRESTOR) 10 MG tablet Take 1 tablet (10 mg total) by mouth daily. 90 tablet 3  . zinc gluconate 50 MG tablet Take 50 mg  by mouth daily.     No current facility-administered medications on file prior to visit.    No Known Allergies  Family History  Problem Relation Age of Onset  . Hypertension Mother   . Hyperlipidemia Mother   . Dementia Father   . Hypertension Father   . AVM Brother   . Anxiety disorder Son   . Seizures Son   . Dementia Paternal Grandfather   . Hypertension Brother   . Hypercalcemia Neg Hx     BP (!) 160/84 (BP Location: Right Arm, Patient Position: Sitting, Cuff Size: Large)   Pulse 72   Ht 5\' 3"  (1.6 m)   Wt 218 lb 9.6 oz (99.2 kg)   SpO2 96%   BMI 38.72 kg/m   Review of Systems     Objective:   Physical Exam VITAL SIGNS:  See vs  page GENERAL: no distress  Lab Results  Component Value Date   PTH 81 (H) 01/19/2021   CALCIUM 11.4 (H) 01/19/2021   PHOS 3.1 07/22/2020   25-OH Vit-D=52    Assessment & Plan:  Vit-D def: well-controlled. Please continue the same supplement. Hyperparathyroidism: uncertain etiology, persistent.  We'll follow while maintaining Vit-D normal.

## 2021-01-20 ENCOUNTER — Ambulatory Visit: Payer: Medicare HMO

## 2021-01-20 LAB — PTH, INTACT AND CALCIUM
Calcium: 11.4 mg/dL — ABNORMAL HIGH (ref 8.6–10.4)
PTH: 81 pg/mL — ABNORMAL HIGH (ref 16–77)

## 2021-01-29 ENCOUNTER — Other Ambulatory Visit: Payer: Self-pay

## 2021-01-29 ENCOUNTER — Ambulatory Visit (INDEPENDENT_AMBULATORY_CARE_PROVIDER_SITE_OTHER): Payer: Medicare HMO

## 2021-01-29 VITALS — BP 122/78 | HR 83 | Temp 98.2°F | Resp 16 | Ht 63.0 in | Wt 217.9 lb

## 2021-01-29 DIAGNOSIS — Z1231 Encounter for screening mammogram for malignant neoplasm of breast: Secondary | ICD-10-CM

## 2021-01-29 DIAGNOSIS — Z0000 Encounter for general adult medical examination without abnormal findings: Secondary | ICD-10-CM | POA: Diagnosis not present

## 2021-01-29 NOTE — Progress Notes (Signed)
Subjective:   Danielle Le is a 65 y.o. female who presents for Medicare Annual (Subsequent) preventive examination.  Review of Systems     Cardiac Risk Factors include: advanced age (>52men, >74 women);dyslipidemia;obesity (BMI >30kg/m2)     Objective:    Today's Vitals   01/29/21 1540  BP: 122/78  Pulse: 83  Resp: 16  Temp: 98.2 F (36.8 C)  TempSrc: Oral  SpO2: 96%  Weight: 217 lb 14.4 oz (98.8 kg)  Height: 5\' 3"  (1.6 m)   Body mass index is 38.6 kg/m.  Advanced Directives 01/29/2021 01/18/2020 07/25/2019 07/04/2019 01/04/2019  Does Patient Have a Medical Advance Directive? No No No No No  Would patient like information on creating a medical advance directive? Yes (MAU/Ambulatory/Procedural Areas - Information given) Yes (MAU/Ambulatory/Procedural Areas - Information given) No - Patient declined Yes (MAU/Ambulatory/Procedural Areas - Information given) No - Patient declined    Current Medications (verified) Outpatient Encounter Medications as of 01/29/2021  Medication Sig  . Apple Cider Vinegar 188 MG CAPS Take 1 capsule by mouth daily.   Marland Kitchen aspirin EC 81 MG tablet Take 81 mg by mouth daily.  . Cholecalciferol (VITAMIN D) 50 MCG (2000 UT) CAPS Take 1 capsule by mouth daily.  Marland Kitchen omeprazole (PRILOSEC) 10 MG capsule Take 10 mg by mouth daily.  . rosuvastatin (CRESTOR) 10 MG tablet Take 1 tablet (10 mg total) by mouth daily.  Marland Kitchen zinc gluconate 50 MG tablet Take 50 mg by mouth daily.  . [DISCONTINUED] Flaxseed, Linseed, (FLAX SEED OIL) 1000 MG CAPS Take 1 capsule by mouth daily.   . [DISCONTINUED] MAGNESIUM PO Take 1 tablet by mouth daily.   . [DISCONTINUED] NIACIN PO Take 1 tablet by mouth daily.   . [DISCONTINUED] Omega-3 Fatty Acids (FISH OIL PO) Take by mouth daily.   No facility-administered encounter medications on file as of 01/29/2021.    Allergies (verified) Patient has no known allergies.   History: Past Medical History:  Diagnosis Date  . Anemia   . Cataract     Past Surgical History:  Procedure Laterality Date  . ABDOMINAL HYSTERECTOMY    . CATARACT EXTRACTION W/PHACO Left 07/04/2019   Procedure: CATARACT EXTRACTION PHACO AND INTRAOCULAR LENS PLACEMENT ([place]) left  00:43.2  12.4%  5.40;  Surgeon: Leandrew Koyanagi, MD;  Location: [facility];  Service: Ophthalmology;  Laterality: Left;  . CATARACT EXTRACTION W/PHACO Right 07/25/2019   Procedure: CATARACT EXTRACTION PHACO AND INTRAOCULAR LENS PLACEMENT ([facility]) RIGHT  00:41.9  12.4%  5.18;  Surgeon: Leandrew Koyanagi, MD;  Location: [facility];  Service: Ophthalmology;  Laterality: Right;  . CESAREAN SECTION    . COLONOSCOPY WITH PROPOFOL N/A 01/04/2019   Procedure: COLONOSCOPY WITH PROPOFOL;  Surgeon: Lin Landsman, MD;  Location: [facility] ENDOSCOPY;  Service: Gastroenterology;  Laterality: N/A;  . ESOPHAGOGASTRODUODENOSCOPY (EGD) WITH PROPOFOL N/A 01/04/2019   Procedure: ESOPHAGOGASTRODUODENOSCOPY (EGD) WITH PROPOFOL;  Surgeon: Lin Landsman, MD;  Location: [facility] ENDOSCOPY;  Service: Gastroenterology;  Laterality: N/A;   Family History  Problem Relation Age of Onset  . Hypertension Mother   . Hyperlipidemia Mother   . Dementia Father   . Hypertension Father   . AVM Brother   . Anxiety disorder Son   . Seizures Son   . Dementia Paternal Grandfather   . Hypertension Brother   . Hypercalcemia Neg Hx    Social History   Socioeconomic History  . Marital status: Divorced    Spouse name: Not on file  . Number of  children: 1  . Years of education: 67  . Highest education level: Associate degree: occupational, Hotel manager, or vocational program  Occupational History  . Occupation: stay home mom  Tobacco Use  . Smoking status: Never Smoker  . Smokeless tobacco: Never Used  Vaping Use  . Vaping Use: Never used  Substance and Sexual Activity  . Alcohol use: Not Currently  . Drug use: Not Currently  . Sexual activity: Not Currently  Other Topics Concern  . Not on file   Social History Narrative   Pt has 67 year old special needs son that lives at home who is non ambulatory/non verbal and she is primary caregiver.    Social Determinants of Health   Financial Resource Strain: Low Risk   . Difficulty of Paying Living Expenses: Not very hard  Food Insecurity: No Food Insecurity  . Worried About Charity fundraiser in the Last Year: Never true  . Ran Out of Food in the Last Year: Never true  Transportation Needs: No Transportation Needs  . Lack of Transportation (Medical): No  . Lack of Transportation (Non-Medical): No  Physical Activity: Inactive  . Days of Exercise per Week: 0 days  . Minutes of Exercise per Session: 0 min  Stress: No Stress Concern Present  . Feeling of Stress : Only a little  Social Connections: Socially Isolated  . Frequency of Communication with Friends and Family: More than three times a week  . Frequency of Social Gatherings with Friends and Family: Twice a week  . Attends Religious Services: Never  . Active Member of Clubs or Organizations: No  . Attends Archivist Meetings: Never  . Marital Status: Divorced    Tobacco Counseling Counseling given: Not Answered   Clinical Intake:  Pre-visit preparation completed: Yes  Pain : No/denies pain     BMI - recorded: 38.6 Nutritional Status: BMI > 30  Obese Nutritional Risks: None Diabetes: No  How often do you need to have someone help you when you read instructions, pamphlets, or other written materials from your doctor or pharmacy?: 1 - Never    Interpreter Needed?: No  Information entered by :: Clemetine Marker LPN   Activities of Daily Living In your present state of health, do you have any difficulty performing the following activities: 01/29/2021 09/08/2020  Hearing? N N  Comment declines hearing aids -  Vision? N Y  Difficulty concentrating or making decisions? N N  Walking or climbing stairs? N N  Dressing or bathing? N N  Doing errands,  shopping? N N  Preparing Food and eating ? N -  Using the Toilet? N -  In the past six months, have you accidently leaked urine? N -  Do you have problems with loss of bowel control? N -  Managing your Medications? N -  Managing your Finances? N -  Housekeeping or managing your Housekeeping? N -  Some recent data might be hidden    Patient Care Team: Delsa Grana, PA-C as PCP - General (Family Medicine)  Indicate any recent Medical Services you may have received from other than [facility] providers in the past year (date may be approximate).     Assessment:   This is a routine wellness examination for Beautiful.  Hearing/Vision screen  Hearing Screening   125Hz  250Hz  500Hz  1000Hz  2000Hz  3000Hz  4000Hz  6000Hz  8000Hz   Right ear:           Left ear:  Comments: Pt denies hearing difficulty  Vision Screening Comments: Annual vision screenings done at [facility]   Dietary issues and exercise activities discussed: Current Exercise Habits: The patient does not participate in regular exercise at present, Exercise limited by: None identified  Goals    . Increase physical activity     Recommend increasing physical activity to at least 3 times per week       Depression Screen [facility] 2/9 Scores 01/29/2021 09/08/2020 05/08/2020 02/05/2020 01/18/2020 10/16/2019 12/18/2018  PHQ - 2 Score 0 0 0 0 0 0 0  PHQ- 9 Score - - 0 0 - 0 0    Fall Risk Fall Risk  01/29/2021 09/08/2020 05/08/2020 02/05/2020 01/18/2020  Falls in the past year? 0 0 0 1 1  Number falls in past yr: 0 0 0 0 1  Comment - - - - mechanical fall  Injury with Fall? 0 0 0 0 0  Risk for fall due to : No Fall Risks - - - No Fall Risks  Follow up Falls prevention discussed Falls evaluation completed Falls evaluation completed - -    FALL RISK PREVENTION PERTAINING TO THE HOME:  Any stairs in or around the home? No  If so, are there any without handrails? No  Home free of loose throw rugs in walkways, pet beds, electrical cords,  etc? Yes  Adequate lighting in your home to reduce risk of falls? Yes   ASSISTIVE DEVICES UTILIZED TO PREVENT FALLS:  Life alert? No  Use of a cane, walker or w/c? No  Grab bars in the bathroom? Yes  Shower chair or bench in shower? Yes  Elevated toilet seat or a handicapped toilet? Yes   TIMED UP AND GO:  Was the test performed? Yes .  Length of time to ambulate 10 feet: 5 sec.   Gait steady and fast without use of assistive device  Cognitive Function: Normal cognitive status assessed by direct observation by this Nurse Health Advisor. No abnormalities found.       6CIT Screen 01/18/2020  What Year? 0 points  What month? 0 points  What time? 0 points  Count back from 20 0 points  Months in reverse 0 points  Repeat phrase 0 points  Total Score 0    Immunizations Immunization History  Administered Date(s) Administered  . Moderna Sars-Covid-2 Vaccination 01/11/2020, 02/06/2020, 09/08/2020    TDAP status: Due, Education has been provided regarding the importance of this vaccine. Advised may receive this vaccine at local pharmacy or Health Dept. Aware to provide a copy of the vaccination record if obtained from local pharmacy or Health Dept. Verbalized acceptance and understanding.  Flu Vaccine status: Declined, Education has been provided regarding the importance of this vaccine but patient still declined. Advised may receive this vaccine at local pharmacy or Health Dept. Aware to provide a copy of the vaccination record if obtained from local pharmacy or Health Dept. Verbalized acceptance and understanding.  Pneumococcal vaccine status: Declined,  Education has been provided regarding the importance of this vaccine but patient still declined. Advised may receive this vaccine at local pharmacy or Health Dept. Aware to provide a copy of the vaccination record if obtained from local pharmacy or Health Dept. Verbalized acceptance and understanding.   Covid-19 vaccine status:  Completed vaccines  Qualifies for Shingles Vaccine? Yes   Zostavax completed No   Shingrix Completed?: No.    Education has been provided regarding the importance of this vaccine. Patient has been advised to  call insurance company to determine out of pocket expense if they have not yet received this vaccine. Advised may also receive vaccine at local pharmacy or Health Dept. Verbalized acceptance and understanding.  Screening Tests Health Maintenance  Topic Date Due  . DEXA SCAN  Never done  . Hepatitis C Screening  Never done  . MAMMOGRAM  11/09/2008  . INFLUENZA VACCINE  01/29/2021 (Originally 06/01/2020)  . PNA vac Low Risk Adult (1 of 2 - PCV13) 02/04/2021 (Originally 11/02/2018)  . TETANUS/TDAP  01/29/2022 (Originally [birth year]02/1972)  . COLONOSCOPY (Pts 45-21yrs Insurance coverage will need to be confirmed)  01/03/2026  . COVID-19 Vaccine  Completed  . HPV VACCINES  Aged Out    Health Maintenance  Health Maintenance Due  Topic Date Due  . DEXA SCAN  Never done  . Hepatitis C Screening  Never done  . MAMMOGRAM  11/09/2008    Colorectal cancer screening: Type of screening: Colonoscopy. Completed 01/04/19. Repeat every 7 years  Mammogram status: Ordered today. Pt provided with contact info and advised to call to schedule appt.   Bone Density status: Ordered 01/19/21. Pt provided with contact info and advised to call to schedule appt.  Lung Cancer Screening: (Low Dose CT Chest recommended if Age 7-80 years, 30 pack-year currently smoking OR have quit w/in 15years.) does not qualify.   Additional Screening:  Hepatitis C Screening: does qualify; postponed  Vision Screening: Recommended annual ophthalmology exams for early detection of glaucoma and other disorders of the eye. Is the patient up to date with their annual eye exam?  Yes  Who is the provider or what is the name of the office in which the patient attends annual eye exams? [place] Screening: Recommended  annual dental exams for proper oral hygiene  Community Resource Referral / Chronic Care Management: CRR required this visit?  No   CCM required this visit?  No      Plan:     I have personally reviewed and noted the following in the patient's chart:   . Medical and social history . Use of alcohol, tobacco or illicit drugs  . Current medications and supplements . Functional ability and status . Nutritional status . Physical activity . Advanced directives . List of other physicians . Hospitalizations, surgeries, and ER visits in previous 12 months . Vitals . Screenings to include cognitive, depression, and falls . Referrals and appointments  In addition, I have reviewed and discussed with patient certain preventive protocols, quality metrics, and best practice recommendations. A written personalized care plan for preventive services as well as general preventive health recommendations were provided to patient.     Clemetine Marker, LPN   [id number]   Nurse Notes: none

## 2021-01-29 NOTE — Patient Instructions (Signed)
[name] , Thank you for taking time to come for your Medicare Wellness Visit. I appreciate your ongoing commitment to your health goals. Please review the following plan we discussed and let me know if I can assist you in the future.   Screening recommendations/referrals: Colonoscopy: done 01/04/19. Repeat in 2027 Mammogram: Please call [phone] to schedule your mammogram and bone density screening.  Bone Density: due Recommended yearly ophthalmology/optometry visit for glaucoma screening and checkup Recommended yearly dental visit for hygiene and checkup  Vaccinations: Influenza vaccine: declined Pneumococcal vaccine: declined Tdap vaccine: due Shingles vaccine: Shingrix discussed. Please contact your pharmacy for coverage information.  Covid-19: done 01/11/20, 02/06/20 & 09/08/20  Advanced directives: Advance directive discussed with you today. I have provided a copy for you to complete at home and have notarized. Once this is complete please bring a copy in to our office so we can scan it into your chart.  Conditions/risks identified: Recommend increasing physical activity to at least 3 days per week   Next appointment: Follow up in one year for your annual wellness visit    Preventive Care 65 Years and Older, Female Preventive care refers to lifestyle choices and visits with your health care provider that can promote health and wellness. What does preventive care include?  A yearly physical exam. This is also called an annual well check.  Dental exams once or twice a year.  Routine eye exams. Ask your health care provider how often you should have your eyes checked.  Personal lifestyle choices, including:  Daily care of your teeth and gums.  Regular physical activity.  Eating a healthy diet.  Avoiding tobacco and drug use.  Limiting alcohol use.  Practicing safe sex.  Taking low-dose aspirin every day.  Taking vitamin and mineral supplements as recommended by  your health care provider. What happens during an annual well check? The services and screenings done by your health care provider during your annual well check will depend on your age, overall health, lifestyle risk factors, and family history of disease. Counseling  Your health care provider may ask you questions about your:  Alcohol use.  Tobacco use.  Drug use.  Emotional well-being.  Home and relationship well-being.  Sexual activity.  Eating habits.  History of falls.  Memory and ability to understand (cognition).  Work and work Statistician.  Reproductive health. Screening  You may have the following tests or measurements:  Height, weight, and BMI.  Blood pressure.  Lipid and cholesterol levels. These may be checked every 5 years, or more frequently if you are over 53 years old.  Skin check.  Lung cancer screening. You may have this screening every year starting at age 60 if you have a 30-pack-year history of smoking and currently smoke or have quit within the past 15 years.  Fecal occult blood test (FOBT) of the stool. You may have this test every year starting at age 54.  Flexible sigmoidoscopy or colonoscopy. You may have a sigmoidoscopy every 5 years or a colonoscopy every 10 years starting at age 45.  Hepatitis C blood test.  Hepatitis B blood test.  Sexually transmitted disease (STD) testing.  Diabetes screening. This is done by checking your blood sugar (glucose) after you have not eaten for a while (fasting). You may have this done every 1-3 years.  Bone density scan. This is done to screen for osteoporosis. You may have this done starting at age 75.  Mammogram. This may be done every 1-2 years. Talk  to your health care provider about how often you should have regular mammograms. Talk with your health care provider about your test results, treatment options, and if necessary, the need for more tests. Vaccines  Your health care provider may  recommend certain vaccines, such as:  Influenza vaccine. This is recommended every year.  Tetanus, diphtheria, and acellular pertussis (Tdap, Td) vaccine. You may need a Td booster every 10 years.  Zoster vaccine. You may need this after age 43.  Pneumococcal 13-valent conjugate (PCV13) vaccine. One dose is recommended after age 67.  Pneumococcal polysaccharide (PPSV23) vaccine. One dose is recommended after age 61. Talk to your health care provider about which screenings and vaccines you need and how often you need them. This information is not intended to replace advice given to you by your health care provider. Make sure you discuss any questions you have with your health care provider. Document Released: 11/14/2015 Document Revised: 07/07/2016 Document Reviewed: 08/19/2015 Elsevier Interactive Patient Education  2017 [employer] Prevention in the Home Falls can cause injuries. They can happen to people of all ages. There are many things you can do to make your home safe and to help prevent falls. What can I do on the outside of my home?  Regularly fix the edges of walkways and driveways and fix any cracks.  Remove anything that might make you trip as you walk through a door, such as a raised step or threshold.  Trim any bushes or trees on the path to your home.  Use bright outdoor lighting.  Clear any walking paths of anything that might make someone trip, such as rocks or tools.  Regularly check to see if handrails are loose or broken. Make sure that both sides of any steps have handrails.  Any raised decks and porches should have guardrails on the edges.  Have any leaves, snow, or ice cleared regularly.  Use sand or salt on walking paths during winter.  Clean up any spills in your garage right away. This includes oil or grease spills. What can I do in the bathroom?  Use night lights.  Install grab bars by the toilet and in the tub and shower. Do not use towel  bars as grab bars.  Use non-skid mats or decals in the tub or shower.  If you need to sit down in the shower, use a plastic, non-slip stool.  Keep the floor dry. Clean up any water that spills on the floor as soon as it happens.  Remove soap buildup in the tub or shower regularly.  Attach bath mats securely with double-sided non-slip rug tape.  Do not have throw rugs and other things on the floor that can make you trip. What can I do in the bedroom?  Use night lights.  Make sure that you have a light by your bed that is easy to reach.  Do not use any sheets or blankets that are too big for your bed. They should not hang down onto the floor.  Have a firm chair that has side arms. You can use this for support while you get dressed.  Do not have throw rugs and other things on the floor that can make you trip. What can I do in the kitchen?  Clean up any spills right away.  Avoid walking on wet floors.  Keep items that you use a lot in easy-to-reach places.  If you need to reach something above you, use a strong step stool that  has a grab bar.  Keep electrical cords out of the way.  Do not use floor polish or wax that makes floors slippery. If you must use wax, use non-skid floor wax.  Do not have throw rugs and other things on the floor that can make you trip. What can I do with my stairs?  Do not leave any items on the stairs.  Make sure that there are handrails on both sides of the stairs and use them. Fix handrails that are broken or loose. Make sure that handrails are as long as the stairways.  Check any carpeting to make sure that it is firmly attached to the stairs. Fix any carpet that is loose or worn.  Avoid having throw rugs at the top or bottom of the stairs. If you do have throw rugs, attach them to the floor with carpet tape.  Make sure that you have a light switch at the top of the stairs and the bottom of the stairs. If you do not have them, ask someone to  add them for you. What else can I do to help prevent falls?  Wear shoes that:  Do not have high heels.  Have rubber bottoms.  Are comfortable and fit you well.  Are closed at the toe. Do not wear sandals.  If you use a stepladder:  Make sure that it is fully opened. Do not climb a closed stepladder.  Make sure that both sides of the stepladder are locked into place.  Ask someone to hold it for you, if possible.  Clearly mark and make sure that you can see:  Any grab bars or handrails.  First and last steps.  Where the edge of each step is.  Use tools that help you move around (mobility aids) if they are needed. These include:  Canes.  Walkers.  Scooters.  Crutches.  Turn on the lights when you go into a dark area. Replace any light bulbs as soon as they burn out.  Set up your furniture so you have a clear path. Avoid moving your furniture around.  If any of your floors are uneven, fix them.  If there are any pets around you, be aware of where they are.  Review your medicines with your doctor. Some medicines can make you feel dizzy. This can increase your chance of falling. Ask your doctor what other things that you can do to help prevent falls. This information is not intended to replace advice given to you by your health care provider. Make sure you discuss any questions you have with your health care provider. Document Released: 08/14/2009 Document Revised: 03/25/2016 Document Reviewed: 11/22/2014 Elsevier Interactive Patient Education  2017 [employer].

## 2021-02-06 DIAGNOSIS — H26492 Other secondary cataract, left eye: Secondary | ICD-10-CM | POA: Diagnosis not present

## 2021-02-24 ENCOUNTER — Other Ambulatory Visit: Payer: Self-pay

## 2021-02-24 ENCOUNTER — Ambulatory Visit
Admission: RE | Admit: 2021-02-24 | Discharge: 2021-02-24 | Disposition: A | Discharge status: Home / Self Care | Payer: Medicare HMO | Source: Ambulatory Visit | Attending: Endocrinology | Admitting: Endocrinology

## 2021-02-24 ENCOUNTER — Ambulatory Visit
Admission: RE | Admit: 2021-02-24 | Discharge: 2021-02-24 | Disposition: A | Discharge status: Home / Self Care | Payer: Medicare HMO | Source: Ambulatory Visit | Attending: Family Medicine | Admitting: Family Medicine

## 2021-02-24 DIAGNOSIS — Z1231 Encounter for screening mammogram for malignant neoplasm of breast: Secondary | ICD-10-CM | POA: Diagnosis not present

## 2021-02-24 DIAGNOSIS — E213 Hyperparathyroidism, unspecified: Secondary | ICD-10-CM

## 2021-02-24 DIAGNOSIS — Z780 Asymptomatic menopausal state: Secondary | ICD-10-CM | POA: Diagnosis not present

## 2021-02-24 DIAGNOSIS — M810 Age-related osteoporosis without current pathological fracture: Secondary | ICD-10-CM | POA: Diagnosis not present

## 2021-02-24 DIAGNOSIS — M85832 Other specified disorders of bone density and structure, left forearm: Secondary | ICD-10-CM | POA: Diagnosis not present

## 2021-02-25 ENCOUNTER — Telehealth: Payer: Self-pay | Admitting: Endocrinology

## 2021-02-25 NOTE — Telephone Encounter (Signed)
Once a month pill: ibandronate Once a year infusion: zoledronate

## 2021-02-25 NOTE — Telephone Encounter (Signed)
Please advice  

## 2021-02-25 NOTE — Telephone Encounter (Signed)
Patient called stated that she had received a message from Dr Loanne Drilling regarding taking monthly or yearly osteoporosis medications.  Wants to find out which medication he is thinking about and would to found out the benefits and potential side effects before making a decision  Call back number [phone]

## 2021-02-26 NOTE — Telephone Encounter (Signed)
LVM--to call the office back regarding  medication. 

## 2021-02-28 ENCOUNTER — Other Ambulatory Visit: Payer: Self-pay | Admitting: Endocrinology

## 2021-02-28 MED ORDER — IBANDRONATE SODIUM 150 MG PO TABS: 150.0000 mg | ORAL_TABLET | ORAL | 3 refills | Status: DC

## 2021-02-28 NOTE — Telephone Encounter (Signed)
Message sent thru MyChart 

## 2021-03-03 DIAGNOSIS — Z0100 Encounter for examination of eyes and vision without abnormal findings: Secondary | ICD-10-CM | POA: Diagnosis not present

## 2021-03-09 ENCOUNTER — Telehealth: Payer: Self-pay

## 2021-03-09 ENCOUNTER — Ambulatory Visit: Payer: Medicare HMO | Admitting: Family Medicine

## 2021-03-09 NOTE — Telephone Encounter (Signed)
FYI: pt was scheduled with Leisa for today at 1pm. I called the pt this morning left a vm and sent a mychart message message letting pt know that this appt had been cancelled due to [facility] not being in the office. Pt comes in around 12:43 to check in. I informed her that her appt had been cancelled and that I had left a vm. She denied that anyone had called. Dr Ancil Boozer then said that she would see her at 1. Pt said if she could not be seen right now then she was not coming back. I did let the pt know that by the time I put her on the schedule and get her checked in and the nurse triage her that it would be 1. Pt refused to be seen and leaves the office.

## 2021-05-18 ENCOUNTER — Ambulatory Visit: Payer: Medicare HMO | Admitting: Family Medicine

## 2021-05-22 ENCOUNTER — Other Ambulatory Visit: Payer: Self-pay

## 2021-05-22 ENCOUNTER — Ambulatory Visit
Admission: RE | Admit: 2021-05-22 | Discharge: 2021-05-22 | Disposition: A | Discharge status: Home / Self Care | Payer: Medicare HMO | Source: Ambulatory Visit | Attending: Family Medicine | Admitting: Family Medicine

## 2021-05-22 ENCOUNTER — Ambulatory Visit
Admission: RE | Admit: 2021-05-22 | Discharge: 2021-05-22 | Disposition: A | Discharge status: Home / Self Care | Payer: Medicare HMO | Attending: Family Medicine | Admitting: Family Medicine

## 2021-05-22 ENCOUNTER — Encounter: Payer: Self-pay | Admitting: Family Medicine

## 2021-05-22 ENCOUNTER — Ambulatory Visit (INDEPENDENT_AMBULATORY_CARE_PROVIDER_SITE_OTHER): Payer: Medicare HMO | Admitting: Family Medicine

## 2021-05-22 VITALS — BP 122/86 | HR 86 | Temp 98.1°F | Resp 16 | Ht 63.0 in | Wt 210.3 lb

## 2021-05-22 DIAGNOSIS — M810 Age-related osteoporosis without current pathological fracture: Secondary | ICD-10-CM | POA: Insufficient documentation

## 2021-05-22 DIAGNOSIS — Z6837 Body mass index (BMI) 37.0-37.9, adult: Secondary | ICD-10-CM

## 2021-05-22 DIAGNOSIS — M25421 Effusion, right elbow: Secondary | ICD-10-CM | POA: Diagnosis not present

## 2021-05-22 DIAGNOSIS — E213 Hyperparathyroidism, unspecified: Secondary | ICD-10-CM | POA: Diagnosis not present

## 2021-05-22 DIAGNOSIS — E559 Vitamin D deficiency, unspecified: Secondary | ICD-10-CM

## 2021-05-22 DIAGNOSIS — M7989 Other specified soft tissue disorders: Secondary | ICD-10-CM | POA: Diagnosis not present

## 2021-05-22 DIAGNOSIS — E785 Hyperlipidemia, unspecified: Secondary | ICD-10-CM

## 2021-05-22 DIAGNOSIS — Z5181 Encounter for therapeutic drug level monitoring: Secondary | ICD-10-CM | POA: Diagnosis not present

## 2021-05-22 DIAGNOSIS — D649 Anemia, unspecified: Secondary | ICD-10-CM | POA: Diagnosis not present

## 2021-05-22 DIAGNOSIS — Z1159 Encounter for screening for other viral diseases: Secondary | ICD-10-CM

## 2021-05-22 MED ORDER — ROSUVASTATIN CALCIUM 10 MG PO TABS: 10.0000 mg | ORAL_TABLET | Freq: Every day | ORAL | 3 refills | Status: DC

## 2021-05-22 NOTE — Progress Notes (Signed)
Name: Danielle Le   MRN: [id number]    DOB: 04-11-1956   Date:05/22/2021       Progress Note  Chief Complaint  Patient presents with   Hyperlipidemia     Subjective:   Danielle Le is a 65 y.o. female, presents to clinic for routine f/up  HLD - on crestor, last labs improved Lab Results  Component Value Date   CHOL 147 10/02/2020   HDL 52 10/02/2020   LDLCALC 72 10/02/2020   TRIG 152 (H) 10/02/2020   CHOLHDL 2.8 10/02/2020    Hyperparathyroid - seeing specialists, new dx osteoporosis - on vit d supplement, calcium and pth have been high, pt advised to start meds for osteoporosis - following per Dr. Loanne Drilling  Hx of anemia and iron deficiency Hemoglobin  Date Value Ref Range Status  10/02/2020 13.2 11.7 - 15.5 g/dL Final  11/06/2019 13.6 11.1 - 15.9 g/dL Final  04/23/2019 12.2 11.1 - 15.9 g/dL Final  01/05/2019 9.4 (L) 11.7 - 15.5 g/dL Final  12/18/2018 9.0 (L) 11.7 - 15.5 g/dL Final   Lab Results  Component Value Date   IRON 127 11/06/2019   TIBC 377 11/06/2019   FERRITIN 39 11/06/2019       Current Outpatient Medications:    aspirin EC 81 MG tablet, Take 81 mg by mouth daily., Disp: , Rfl:    Cholecalciferol (VITAMIN D) 50 MCG (2000 UT) CAPS, Take 1 capsule by mouth daily., Disp: , Rfl:    ibandronate (BONIVA) 150 MG tablet, Take 1 tablet (150 mg total) by mouth every 30 (thirty) days. Take in the morning with a full glass of water, on an empty stomach, and do not take anything else by mouth or lie down for the next 30 min., Disp: 3 tablet, Rfl: 3   omeprazole (PRILOSEC) 10 MG capsule, Take 10 mg by mouth daily., Disp: , Rfl:    rosuvastatin (CRESTOR) 10 MG tablet, Take 1 tablet (10 mg total) by mouth daily., Disp: 90 tablet, Rfl: 3   zinc gluconate 50 MG tablet, Take 50 mg by mouth daily., Disp: , Rfl:   Patient Active Problem List   Diagnosis Date Noted   Osteoporosis without current pathological fracture 05/22/2021   Class 2 severe obesity with serious  comorbidity and body mass index (BMI) of 37.0 to 37.9 in adult [facility]) 05/22/2021   Hyperparathyroidism ([place]) 07/22/2020   Hypercalcemia 02/06/2020   Vitamin D deficiency 02/06/2020   Hypertriglyceridemia 02/06/2020   Cardiac murmur 01/05/2019   Iron deficiency anemia 01/05/2019   Hypercholesteremia 01/05/2019   Obesity (BMI 35.0-39.9 without comorbidity) 12/18/2018   Family history of high cholesterol 12/18/2018   Family history of dementia 12/18/2018    Past Surgical History:  Procedure Laterality Date   ABDOMINAL HYSTERECTOMY     CATARACT EXTRACTION W/PHACO Left 07/04/2019   Procedure: CATARACT EXTRACTION PHACO AND INTRAOCULAR LENS PLACEMENT ([place]) left  00:43.2  12.4%  5.40;  Surgeon: Leandrew Koyanagi, MD;  Location: [place];  Service: Ophthalmology;  Laterality: Left;   CATARACT EXTRACTION W/PHACO Right 07/25/2019   Procedure: CATARACT EXTRACTION PHACO AND INTRAOCULAR LENS PLACEMENT ([facility]) RIGHT  00:41.9  12.4%  5.18;  Surgeon: Leandrew Koyanagi, MD;  Location: [place];  Service: Ophthalmology;  Laterality: Right;   CESAREAN SECTION     COLONOSCOPY WITH PROPOFOL N/A 01/04/2019   Procedure: COLONOSCOPY WITH PROPOFOL;  Surgeon: Lin Landsman, MD;  Location: [facility] ENDOSCOPY;  Service: Gastroenterology;  Laterality: N/A;   ESOPHAGOGASTRODUODENOSCOPY (EGD) WITH  PROPOFOL N/A 01/04/2019   Procedure: ESOPHAGOGASTRODUODENOSCOPY (EGD) WITH PROPOFOL;  Surgeon: Lin Landsman, MD;  Location: [facility] ENDOSCOPY;  Service: Gastroenterology;  Laterality: N/A;    Family History  Problem Relation Age of Onset   Hypertension Mother    Hyperlipidemia Mother    Dementia Father    Hypertension Father    AVM Brother    Anxiety disorder Son    Seizures Son    Dementia Paternal Grandfather    Hypertension Brother    Hypercalcemia Neg Hx     Social History   Tobacco Use   Smoking status: Never   Smokeless tobacco: Never  Vaping Use   Vaping Use: Never used   Substance Use Topics   Alcohol use: Not Currently   Drug use: Not Currently     No Known Allergies  Health Maintenance  Topic Date Due   Hepatitis C Screening  Never done   COVID-19 Vaccine (4 - Booster for Moderna series) 01/06/2021   Zoster Vaccines- Shingrix (1 of 2) 08/22/2021 (Originally 11/03/2003)   TETANUS/TDAP  01/29/2022 (Originally 11/02/1972)   PNA vac Low Risk Adult (1 of 2 - PCV13) 05/22/2022 (Originally 11/02/2018)   INFLUENZA VACCINE  06/01/2021   MAMMOGRAM  02/24/2022   DEXA SCAN  02/25/2023   COLONOSCOPY (Pts 45-86yr Insurance coverage will need to be confirmed)  01/03/2026   HPV VACCINES  Aged Out    Chart Review Today: I personally reviewed active problem list, medication list, allergies, family history, social history, health maintenance, notes from last encounter, lab results, imaging with the patient/caregiver today.   Review of Systems  Constitutional: Negative.   HENT: Negative.    Eyes: Negative.   Respiratory: Negative.    Cardiovascular: Negative.   Gastrointestinal: Negative.   Endocrine: Negative.   Genitourinary: Negative.   Musculoskeletal: Negative.   Skin: Negative.   Allergic/Immunologic: Negative.   Neurological: Negative.   Hematological: Negative.   Psychiatric/Behavioral: Negative.    All other systems reviewed and are negative.   Objective:   Vitals:   05/22/21 1429  BP: 122/86  Pulse: 86  Resp: 16  Temp: 98.1 F (36.7 C)  SpO2: 98%  Weight: 210 lb 4.8 oz (95.4 kg)  Height: '5\' 3"'$  (1.6 m)    Body mass index is 37.25 kg/m.  Physical Exam Vitals and nursing note reviewed.  Constitutional:      General: She is not in acute distress.    Appearance: Normal appearance. She is well-developed. She is obese. She is not ill-appearing, toxic-appearing or diaphoretic.     Interventions: Face mask in place.  HENT:     Head: Normocephalic and atraumatic.     Right Ear: External ear normal.     Left Ear: External ear normal.   Eyes:     General: Lids are normal. No scleral icterus.       Right eye: No discharge.        Left eye: No discharge.     Conjunctiva/sclera: Conjunctivae normal.  Neck:     Trachea: Phonation normal. No tracheal deviation.  Cardiovascular:     Rate and Rhythm: Normal rate and regular rhythm.     Pulses: Normal pulses.          Radial pulses are 2+ on the right side and 2+ on the left side.       Posterior tibial pulses are 2+ on the right side and 2+ on the left side.     Heart sounds: Normal heart sounds.  No murmur heard.   No friction rub. No gallop.  Pulmonary:     Effort: Pulmonary effort is normal. No respiratory distress.     Breath sounds: Normal breath sounds. No stridor. No wheezing, rhonchi or rales.  Chest:     Chest wall: No tenderness.  Abdominal:     General: Bowel sounds are normal. There is no distension.     Palpations: Abdomen is soft.  Musculoskeletal:        General: Tenderness present.     Right elbow: Effusion (no erythema, induration or edema) present. No deformity. Normal range of motion. Tenderness present.     Right lower leg: No edema.     Left lower leg: No edema.  Skin:    General: Skin is warm and dry.     Capillary Refill: Capillary refill takes less than 2 seconds.     Coloration: Skin is not jaundiced or pale.     Findings: No lesion or rash.  Neurological:     Mental Status: She is alert. Mental status is at baseline.     Motor: No abnormal muscle tone.     Gait: Gait normal.  Psychiatric:        Mood and Affect: Mood normal.        Speech: Speech normal.        Behavior: Behavior normal.        Assessment & Plan:     ICD-10-CM   1. Hyperlipidemia, unspecified hyperlipidemia type  E78.5 rosuvastatin (CRESTOR) 10 MG tablet    COMPLETE METABOLIC PANEL WITH GFR    Lipid panel   discussed again at length HLD, ASCVD, benefit and SE of statin, encouraged her to try crestor, keep working on diet exercise, f/up 3-4 months    2.  Hyperparathyroidism ([place])  99991111 COMPLETE METABOLIC PANEL WITH GFR    Parathyroid hormone, intact (no Ca)    VITAMIN D 25 Hydroxy (Vit-D Deficiency, Fractures)   per endo    3. Osteoporosis without current pathological fracture, unspecified osteoporosis type  [id number] COMPLETE METABOLIC PANEL WITH GFR    Parathyroid hormone, intact (no Ca)    VITAMIN D 25 Hydroxy (Vit-D Deficiency, Fractures)   managed by Dr. Loanne Drilling, dx 02/24/21    4. Class 2 severe obesity with serious comorbidity and body mass index (BMI) of 37.0 to 37.9 in adult, unspecified obesity type (HCC)  E66.01 CBC with Differential/Platelet   [id number] COMPLETE METABOLIC PANEL WITH GFR    Lipid panel    5. Encounter for hepatitis C screening test for low risk patient  Z11.59 Hepatitis C Antibody    6. Vitamin D deficiency  Q000111Q COMPLETE METABOLIC PANEL WITH GFR    7. Medication monitoring encounter  Z51.81 CBC with Differential/Platelet    COMPLETE METABOLIC PANEL WITH GFR    Lipid panel    Parathyroid hormone, intact (no Ca)    VITAMIN D 25 Hydroxy (Vit-D Deficiency, Fractures)    8. Anemia, unspecified type  D64.9 CBC with Differential/Platelet   monitoring, prior GI work up    9. Elbow joint effusion, right  M25.421 DG Elbow Complete Right   no trauma - states it developed after covid- xray to eval joint - may need ortho or fluid drained        Return in about 1 year (around 05/22/2022) for Routine follow-up HLD .   Delsa Grana, PA-C 05/22/21 2:47 PM

## 2021-05-24 ENCOUNTER — Encounter: Payer: Self-pay | Admitting: Family Medicine

## 2021-05-24 DIAGNOSIS — M25421 Effusion, right elbow: Secondary | ICD-10-CM

## 2021-05-26 NOTE — Telephone Encounter (Signed)
-----   Message from Delsa Grana, [place] sent at 05/25/2021  5:47 PM EDT ----- X-ray neg/unremarkable Please put in referral to ortho for elbow pain and effusion (dx was in chart)  Please let pt know ortho would be appropriate (not rheumatology) - [facility]

## 2021-05-28 DIAGNOSIS — M7021 Olecranon bursitis, right elbow: Secondary | ICD-10-CM | POA: Diagnosis not present

## 2021-06-08 ENCOUNTER — Telehealth: Payer: Self-pay

## 2021-06-08 ENCOUNTER — Telehealth: Payer: Self-pay | Admitting: *Deleted

## 2021-06-08 NOTE — Chronic Care Management (AMB) (Signed)
  Chronic Care Management   Note  06/08/2021 Name: CARLE DARGAN MRN: [id number] DOB: 11-07-1955  TASHAWNDA BLEILER is a 66 y.o. year old female who is a primary care patient of Delsa Grana, [place]. I reached out to [name] by phone today in response to a referral sent by Ms. Mary Sella E Jares's PCP Delsa Grana, PA-C     Ms. Hank was given information about Chronic Care Management services today including:  CCM service includes personalized support from designated clinical staff supervised by her physician, including individualized plan of care and coordination with other care providers 24/7 contact phone numbers for assistance for urgent and routine care needs. Service will only be billed when office clinical staff spend 20 minutes or more in a month to coordinate care. Only one practitioner may furnish and bill the service in a calendar month. The patient may stop CCM services at any time (effective at the end of the month) by phone call to the office staff. The patient will be responsible for cost sharing (co-pay) of up to 20% of the service fee (after annual deductible is met).  Patient agreed to services and verbal consent obtained.   Follow up plan: Telephone appointment with care management team member scheduled for: 07/01/2021  Julian Hy, [place] Management  Direct Dial: [phone]

## 2021-06-08 NOTE — Telephone Encounter (Signed)
-----   Message from Delsa Grana, [place] sent at 06/08/2021  1:11 PM EDT ----- Please check on pt and see if her elbow is still swollen - we don't have adequate supplies to do procedure here - please refer her to ortho if still having elbow pain and/or swelling and she wants eval by ortho

## 2021-06-08 NOTE — Telephone Encounter (Signed)
Left detailed vm to call back if would like referral

## 2021-07-01 ENCOUNTER — Telehealth: Payer: Self-pay

## 2021-07-01 ENCOUNTER — Telehealth: Payer: Medicare HMO

## 2021-07-01 NOTE — Telephone Encounter (Signed)
  Care Management   Follow Up Note   07/01/2021 Name: Danielle Le MRN: [id number] DOB: Jan 31, 1956   Primary Care Provider: Delsa Grana, PA-C Reason for referral : Chronic Care Management   An unsuccessful telephone outreach was attempted today. The patient was referred to the case management team for assistance with care management and care coordination.    Follow Up Plan:  A member of the care management team will attempt to reach [name] again within the next two weeks.    Cristy [facility] Care Management [facility] [phone]

## 2021-07-15 ENCOUNTER — Telehealth: Payer: Self-pay

## 2021-07-15 NOTE — Telephone Encounter (Signed)
  Care Management   Follow Up Note   07/15/2021 Name: NATASHIA DEININGER MRN: [id number] DOB: [birth year]   Primary Care Provider: Delsa Grana, PA-C Reason for referral : Chronic Care Management   An unsuccessful telephone outreach was attempted today. The patient was referred to the case management team for assistance with care management and care coordination.    Follow Up Plan:  A HIPAA compliant voice message was left today requesting a return call.   Cristy [facility] Care Management [facility] [phone]

## 2021-07-22 ENCOUNTER — Ambulatory Visit: Payer: Self-pay

## 2021-07-22 DIAGNOSIS — E7800 Pure hypercholesterolemia, unspecified: Secondary | ICD-10-CM

## 2021-07-22 NOTE — Chronic Care Management (AMB) (Signed)
  Chronic Care Management   CCM RN Visit Note  07/22/2021 Name: Danielle Le MRN: [id number] DOB: 1956/10/20  Subjective: Danielle Le is a 65 y.o. year old female who is a primary care patient of Delsa Grana, [place]. The care management team was consulted for assistance with disease management and care coordination needs.  She anticipates being available for care management outreach on 07/29/21. Encouraged to call or notify the clinic with urgent questions and concerns if needed prior to the scheduled outreach.    PLAN Will follow up on 07/29/21.    Cristy [facility] Care Management [facility] [phone]

## 2021-07-23 ENCOUNTER — Other Ambulatory Visit: Payer: Medicare HMO

## 2021-07-23 ENCOUNTER — Other Ambulatory Visit (INDEPENDENT_AMBULATORY_CARE_PROVIDER_SITE_OTHER): Payer: Medicare HMO

## 2021-07-23 ENCOUNTER — Other Ambulatory Visit: Payer: Self-pay

## 2021-07-23 ENCOUNTER — Ambulatory Visit (INDEPENDENT_AMBULATORY_CARE_PROVIDER_SITE_OTHER): Payer: Medicare HMO | Admitting: Endocrinology

## 2021-07-23 VITALS — BP 140/80 | HR 66 | Ht 63.0 in | Wt 210.4 lb

## 2021-07-23 DIAGNOSIS — E213 Hyperparathyroidism, unspecified: Secondary | ICD-10-CM | POA: Diagnosis not present

## 2021-07-23 NOTE — Patient Instructions (Signed)
Blood tests are requested for you today.  We'll let you know about the results.  Please continue the same ibandronate. Please come back for a follow-up appointment in 6 months.

## 2021-07-23 NOTE — Progress Notes (Signed)
Subjective:    Patient ID: Danielle Le, female    DOB: 1956-08-18, 65 y.o.   MRN: [id number]  HPI Pt returns for f/u of primary hyperparathyroidism (dx'ed 2020; she has never had urolithiasis, or bony fracture; PTH is high, and Vit-D is low; she takes Vit-D, 5000 units/d; other w/u of hypercalcemia was neg; she started ibandronate for osteoporosis 4/22).  She takes Vit-D as rx'ed.  pt states she feels well in general.   Past Medical History:  Diagnosis Date   Anemia    Cataract     Past Surgical History:  Procedure Laterality Date   ABDOMINAL HYSTERECTOMY     CATARACT EXTRACTION W/PHACO Left 07/04/2019   Procedure: CATARACT EXTRACTION PHACO AND INTRAOCULAR LENS PLACEMENT ([place]) left  00:43.2  12.4%  5.40;  Surgeon: Leandrew Koyanagi, MD;  Location: [place];  Service: Ophthalmology;  Laterality: Left;   CATARACT EXTRACTION W/PHACO Right 07/25/2019   Procedure: CATARACT EXTRACTION PHACO AND INTRAOCULAR LENS PLACEMENT ([facility]) RIGHT  00:41.9  12.4%  5.18;  Surgeon: Leandrew Koyanagi, MD;  Location: [place];  Service: Ophthalmology;  Laterality: Right;   CESAREAN SECTION     COLONOSCOPY WITH PROPOFOL N/A 01/04/2019   Procedure: COLONOSCOPY WITH PROPOFOL;  Surgeon: Lin Landsman, MD;  Location: [facility] ENDOSCOPY;  Service: Gastroenterology;  Laterality: N/A;   ESOPHAGOGASTRODUODENOSCOPY (EGD) WITH PROPOFOL N/A 01/04/2019   Procedure: ESOPHAGOGASTRODUODENOSCOPY (EGD) WITH PROPOFOL;  Surgeon: Lin Landsman, MD;  [facility] ENDOSCOPY;  Service: Gastroenterology;  Laterality: N/A;    Social History   Socioeconomic History   Marital status: Divorced    Spouse name: Not on file   Number of children: 1   Years of education: 12   Highest education level: Associate degree: occupational, Hotel manager, or vocational program  Occupational History   Occupation: stay home mom  Tobacco Use   Smoking status: Never   Smokeless tobacco: Never  Vaping Use   Vaping Use:  Never used  Substance and Sexual Activity   Alcohol use: Not Currently   Drug use: Not Currently   Sexual activity: Not Currently  Other Topics Concern   Not on file  Social History Narrative   Pt has 67 year old special needs son that lives at home who is non ambulatory/non verbal and she is primary caregiver.    Social Determinants of Health   Financial Resource Strain: Low Risk    Difficulty of Paying Living Expenses: Not very hard  Food Insecurity: No Food Insecurity   Worried About Charity fundraiser in the Last Year: Never true   Ran Out of Food in the Last Year: Never true  Transportation Needs: No Transportation Needs   Lack of Transportation (Medical): No   Lack of Transportation (Non-Medical): No  Physical Activity: Inactive   Days of Exercise per Week: 0 days   Minutes of Exercise per Session: 0 min  Stress: No Stress Concern Present   Feeling of Stress : Only a little  Social Connections: Socially Isolated   Frequency of Communication with Friends and Family: More than three times a week   Frequency of Social Gatherings with Friends and Family: Twice a week   Attends Religious Services: Never   Marine scientist or Organizations: No   Attends Music therapist: Never   Marital Status: Divorced  Human resources officer Violence: Not At Risk   Fear of Current or Ex-Partner: No   Emotionally Abused: No   Physically Abused: No   Sexually  Abused: No    Current Outpatient Medications on File Prior to Visit  Medication Sig Dispense Refill   aspirin EC 81 MG tablet Take 81 mg by mouth daily.     Cholecalciferol (VITAMIN D) 50 MCG (2000 UT) CAPS Take 1 capsule by mouth daily.     ibandronate (BONIVA) 150 MG tablet Take 1 tablet (150 mg total) by mouth every 30 (thirty) days. Take in the morning with a full glass of water, on an empty stomach, and do not take anything else by mouth or lie down for the next 30 min. 3 tablet 3   omeprazole (PRILOSEC) 10 MG  capsule Take 10 mg by mouth daily.     rosuvastatin (CRESTOR) 10 MG tablet Take 1 tablet (10 mg total) by mouth daily. 90 tablet 3   zinc gluconate 50 MG tablet Take 50 mg by mouth daily.     No current facility-administered medications on file prior to visit.    No Known Allergies  Family History  Problem Relation Age of Onset   Hypertension Mother    Hyperlipidemia Mother    Dementia Father    Hypertension Father    AVM Brother    Anxiety disorder Son    Seizures Son    Dementia Paternal Grandfather    Hypertension Brother    Hypercalcemia Neg Hx     BP 140/80 (BP Location: Right Arm, Patient Position: Sitting, Cuff Size: Large)   Pulse 66   Ht 5\' 3"  (1.6 m)   Wt 210 lb 6.4 oz (95.4 kg)   SpO2 96%   BMI 37.27 kg/m    Review of Systems She has muscle weakness, but is uncertain if this is abnormal for age.      Objective:   Physical Exam VITAL SIGNS:  See vs page GENERAL: no distress Ext: no leg edema.  GAIT: normal and steady.    25-OH Vit-D=51 Lab Results  Component Value Date   PTH 98 (H) 07/23/2021   CALCIUM 11.0 (H) 07/23/2021   CALCIUM 11.2 (H) 07/23/2021   PHOS 3.1 07/22/2020       Assessment & Plan:  Primary hyperparathyroidism: uncontrolled Vit-D def: well-controlled.  Please continue the same Vit-D supplement Osteoporosis: Please continue the same ibandronate.

## 2021-07-24 LAB — BASIC METABOLIC PANEL
BUN: 11 mg/dL (ref 6–23)
CO2: 29 mEq/L (ref 19–32)
Calcium: 11.2 mg/dL — ABNORMAL HIGH (ref 8.4–10.5)
Chloride: 103 mEq/L (ref 96–112)
Creatinine, Ser: 0.62 mg/dL (ref 0.40–1.20)
GFR: 91.95 mL/min (ref >60.00)
Glucose, Bld: 86 mg/dL (ref 70–99)
Potassium: 4.1 mEq/L (ref 3.5–5.1)
Sodium: 138 mEq/L (ref 135–145)

## 2021-07-24 LAB — VITAMIN D 25 HYDROXY (VIT D DEFICIENCY, FRACTURES): VITD: 50.94 ng/mL (ref 30.00–100.00)

## 2021-07-25 LAB — PTH, INTACT AND CALCIUM
Calcium: 11 mg/dL — ABNORMAL HIGH (ref 8.6–10.4)
PTH: 98 pg/mL — ABNORMAL HIGH (ref 16–77)

## 2021-07-29 ENCOUNTER — Ambulatory Visit (INDEPENDENT_AMBULATORY_CARE_PROVIDER_SITE_OTHER): Payer: Medicare HMO

## 2021-07-29 DIAGNOSIS — E785 Hyperlipidemia, unspecified: Secondary | ICD-10-CM

## 2021-07-29 DIAGNOSIS — K219 Gastro-esophageal reflux disease without esophagitis: Secondary | ICD-10-CM

## 2021-07-29 NOTE — Chronic Care Management (AMB) (Addendum)
Chronic Care Management   CCM RN Visit Note  07/29/2021 Name: Danielle Le MRN: [id number] DOB: 10/10/1956  Subjective: Danielle Le is a 65 y.o. year old female who is a primary care patient of Delsa Grana, [place]. The care management team was consulted for assistance with disease management and care coordination needs.    Engaged with patient by telephone for initial visit in response to provider referral for case management and care coordination services.   Consent to Services:  The patient was given the following information about Chronic Care Management services: 1. CCM service includes personalized support from designated clinical staff supervised by the primary care provider, including individualized plan of care and coordination with other care providers 2. 24/7 contact phone numbers for assistance for urgent and routine care needs. 3. Service will only be billed when office clinical staff spend 20 minutes or more in a month to coordinate care. 4. Only one practitioner may furnish and bill the service in a calendar month. 5.The patient may stop CCM services at any time (effective at the end of the month) by phone call to the office staff. 6. The patient will be responsible for cost sharing (co-pay) of up to 20% of the service fee (after annual deductible is met). Patient agreed to services and consent obtained.  Assessment: Review of patient past medical history, allergies, medications, health status, including review of consultants reports, laboratory and other test data, was performed as part of comprehensive evaluation and provision of chronic care management services.   SDOH (Social Determinants of Health) assessments and interventions performed:  SDOH Interventions    Flowsheet Row Most Recent Value  SDOH Interventions   Food Insecurity Interventions Intervention Not Indicated  Transportation Interventions Intervention Not Indicated        CCM Care Plan  No Known  Allergies  Outpatient Encounter Medications as of 07/29/2021  Medication Sig Note   aspirin EC 81 MG tablet Take 81 mg by mouth daily.    Cholecalciferol (VITAMIN D) 50 MCG (2000 UT) CAPS Take 1 capsule by mouth daily. 01/29/2021: Pt taking 5000 iu   ibandronate (BONIVA) 150 MG tablet Take 1 tablet (150 mg total) by mouth every 30 (thirty) days. Take in the morning with a full glass of water, on an empty stomach, and do not take anything else by mouth or lie down for the next 30 min.    omeprazole (PRILOSEC) 10 MG capsule Take 10 mg by mouth daily.    rosuvastatin (CRESTOR) 10 MG tablet Take 1 tablet (10 mg total) by mouth daily.    zinc gluconate 50 MG tablet Take 50 mg by mouth daily.    No facility-administered encounter medications on file as of 07/29/2021.    Patient Active Problem List   Diagnosis Date Noted   Osteoporosis without current pathological fracture 05/22/2021   Class 2 severe obesity with serious comorbidity and body mass index (BMI) of 37.0 to 37.9 in adult [facility]) 05/22/2021   Hyperparathyroidism ([place]) 07/22/2020   Hypercalcemia 02/06/2020   Vitamin D deficiency 02/06/2020   Hypertriglyceridemia 02/06/2020   Cardiac murmur 01/05/2019   Iron deficiency anemia 01/05/2019   Hypercholesteremia 01/05/2019   Obesity (BMI 35.0-39.9 without comorbidity) 12/18/2018   Family history of high cholesterol 12/18/2018   Family history of dementia 12/18/2018    Conditions to be addressed/monitored:HLD and GERD Patient Care Plan: Hyperlipidemia     Problem Identified: Hyperlipidemia      Long-Range Goal: Hyperlipidemia Monitored   Start Date: 07/29/2021  Expected End Date: 10/27/2021  Priority: Medium  Note:   Objective:  BP Readings from Last 3 Encounters:  07/23/21 140/80  05/22/21 122/86  01/29/21 122/78   Most recent eGFR/CrCl: No results found for: EGFR   Lab Results  Component Value Date   CHOL 147 10/02/2020   HDL 52 10/02/2020   LDLCALC 72 10/02/2020   TRIG  152 (H) 10/02/2020   CHOLHDL 2.8 10/02/2020      Current Barriers:  Chronic Disease Management support and educational needs related to Hyperlipidemia.  Case Manager Clinical Goal(s):  Over the next 90 days, patient will demonstrate adherence to prescribed treatment plan as evidenced by taking all medications as prescribed, completing labs as ordered and adhering to a low sodium/DASH diet.  Interventions:  Collaboration with Delsa Grana, PA-C regarding development and update of comprehensive plan of care as evidenced by provider attestation and co-signature Inter-disciplinary care team collaboration (see longitudinal plan of care) Reviewed medications and importance of compliance. Reports taking medications as prescribed and tolerating regimen. Advised to notify the provider with changes in medication tolerance. Discussed compliance with recommended cardiac prudent diet and activity tolerance. Reports doing well with nutritional intake. Symptoms of gastric reflux are well controlled. Attempting to comply with cardiac prudent/heart healthy diet. Encouraged to read nutrition labels, monitor sodium intake and avoid highly processed foods when possible. Ambulates without difficulty. Able to tolerate mild activity without complications.  Reviewed s/sx of heart attack, stroke and worsening symptoms that require immediate medical attention.  Patient Goals/Self-Care Activities: Self-administer medications as prescribed Adhere to recommended cardiac prudent/heart healthy diet Advised to engaged in low impact exercises/activities as tolerated Notify provider or care management team with questions and new concerns as needed   Follow Up Plan:  Will follow up in three months        PLAN A member of the care management team will follow up in three months.    Cristy Friedlander [facility] Care Management [facility] [phone]

## 2021-07-29 NOTE — Patient Instructions (Addendum)
Thank you for allowing the Chronic Care Management team to participate in your care.    Patient Care Plan: Hyperlipidemia     Problem Identified: Hyperlipidemia      Long-Range Goal: Hyperlipidemia Monitored   Start Date: 07/29/2021  Expected End Date: 10/27/2021  Priority: Medium  Note:   Objective:  BP Readings from Last 3 Encounters:  07/23/21 140/80  05/22/21 122/86  01/29/21 122/78   Most recent eGFR/CrCl: No results found for: EGFR   Lab Results  Component Value Date   CHOL 147 10/02/2020   HDL 52 10/02/2020   LDLCALC 72 10/02/2020   TRIG 152 (H) 10/02/2020   CHOLHDL 2.8 10/02/2020      Current Barriers:  Chronic Disease Management support and educational needs related to Hyperlipidemia.  Case Manager Clinical Goal(s):  Over the next 90 days, patient will demonstrate adherence to prescribed treatment plan as evidenced by taking all medications as prescribed, completing labs as ordered and adhering to a low sodium/DASH diet.  Interventions:  Collaboration with Delsa Grana, PA-C regarding development and update of comprehensive plan of care as evidenced by provider attestation and co-signature Inter-disciplinary care team collaboration (see longitudinal plan of care) Reviewed medications and importance of compliance. Reports taking medications as prescribed and tolerating regimen. Advised to notify the provider with changes in medication tolerance. Discussed compliance with recommended cardiac prudent diet and activity tolerance. Reports doing well with nutritional intake. Symptoms of gastric reflux are well controlled. Attempting to comply with cardiac prudent/heart healthy diet. Encouraged to read nutrition labels, monitor sodium intake and avoid highly processed foods when possible. Ambulates without difficulty. Able to tolerate mild activity without complications.  Reviewed s/sx of heart attack, stroke and worsening symptoms that require immediate medical  attention.  Patient Goals/Self-Care Activities: Self-administer medications as prescribed Adhere to recommended cardiac prudent/heart healthy diet Advised to engaged in low impact exercises/activities as tolerated Notify provider or care management team with questions and new concerns as needed   Follow Up Plan:  Will follow up in three months        Ms. Myhre verbalized understanding of the information discussed during the telephonic outreach. Declined need for mailed/printed instructions. A member of the care management team will follow up in three months.    Cristy Friedlander [facility] Care Management [facility] [phone]

## 2021-07-31 DIAGNOSIS — E785 Hyperlipidemia, unspecified: Secondary | ICD-10-CM | POA: Diagnosis not present

## 2021-07-31 DIAGNOSIS — E7800 Pure hypercholesterolemia, unspecified: Secondary | ICD-10-CM

## 2021-09-10 IMAGING — MG MM DIGITAL SCREENING BILAT W/ TOMO AND CAD
6 of 10 series · 6 of 30 positions shown · non-contrast
Comparison: Previous exam(s).

CLINICAL DATA: Screening.

EXAM:
DIGITAL SCREENING BILATERAL MAMMOGRAM WITH TOMOSYNTHESIS AND CAD
TECHNIQUE: Bilateral screening digital craniocaudal and mediolateral oblique
mammograms were obtained. Bilateral screening digital breast
tomosynthesis was performed. The images were evaluated with
computer-aided detection.

[L MLO synth-2D]
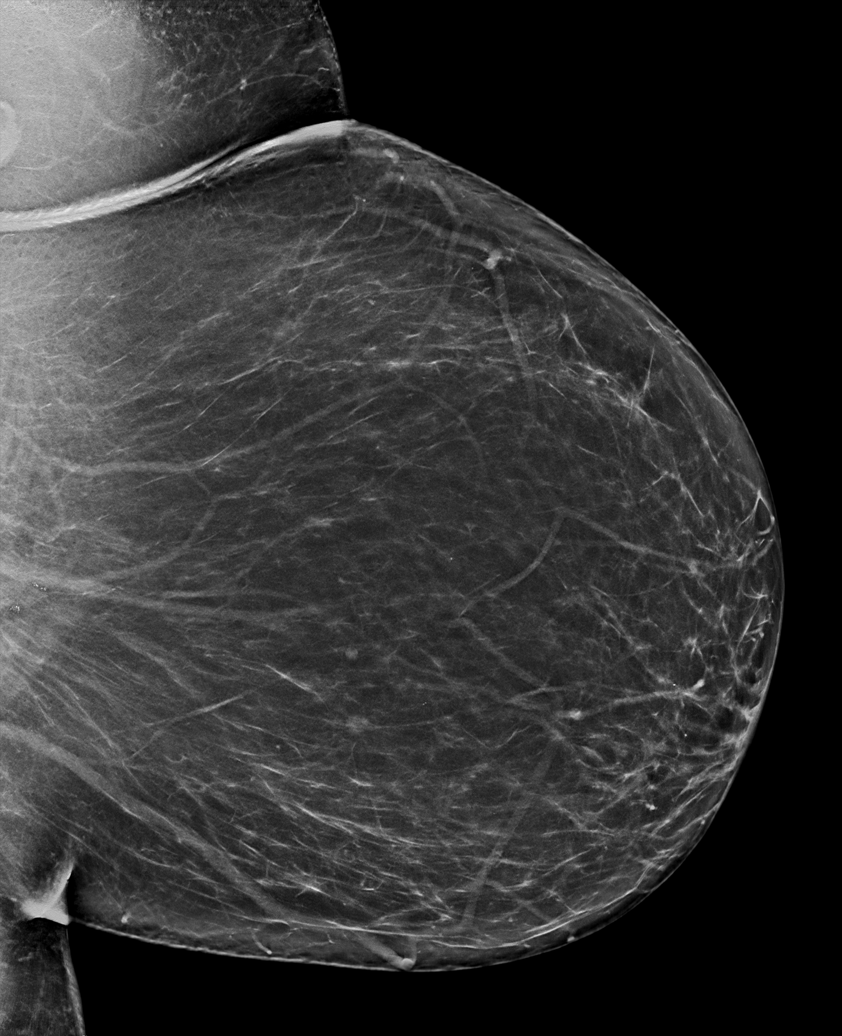

[L CC synth-2D]
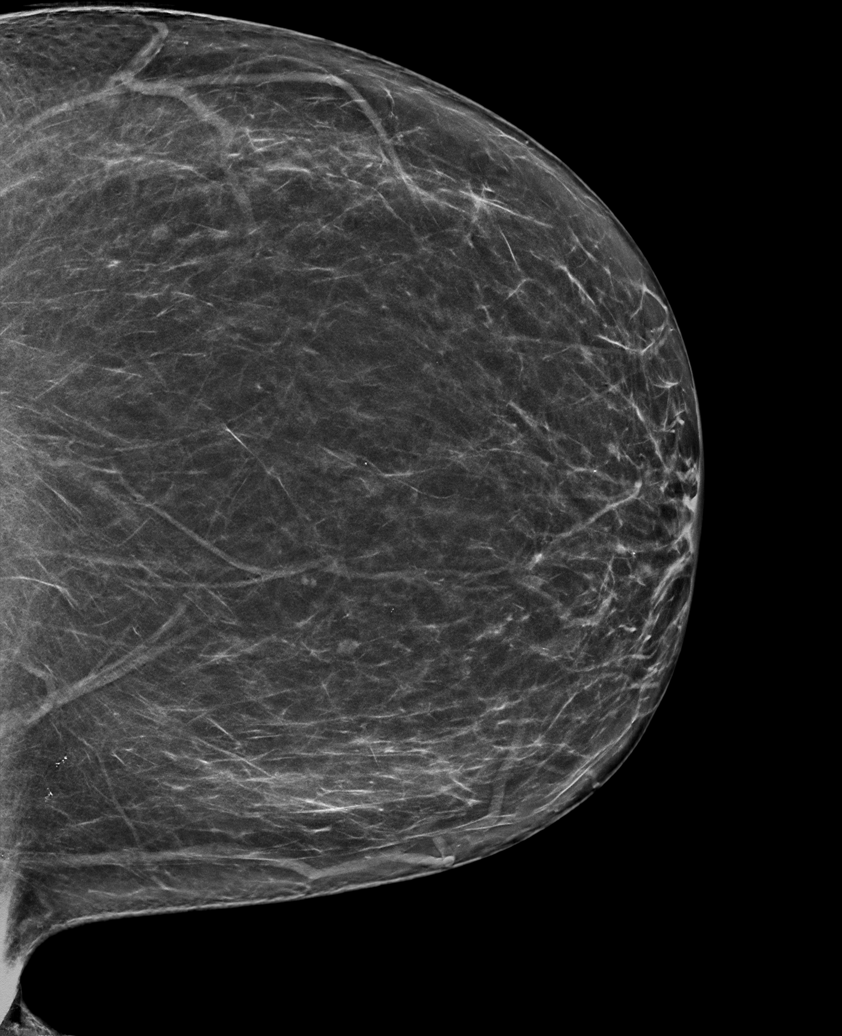

[R MLO synth-2D]
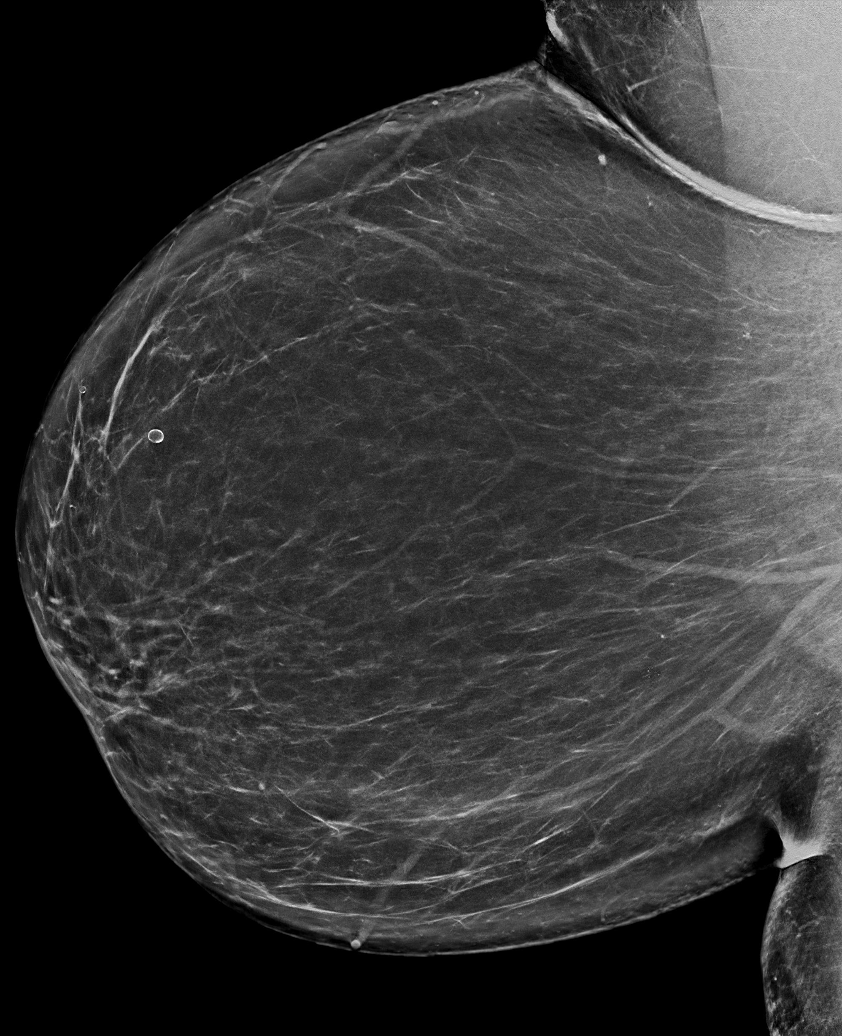

[R XCCL synth-2D]
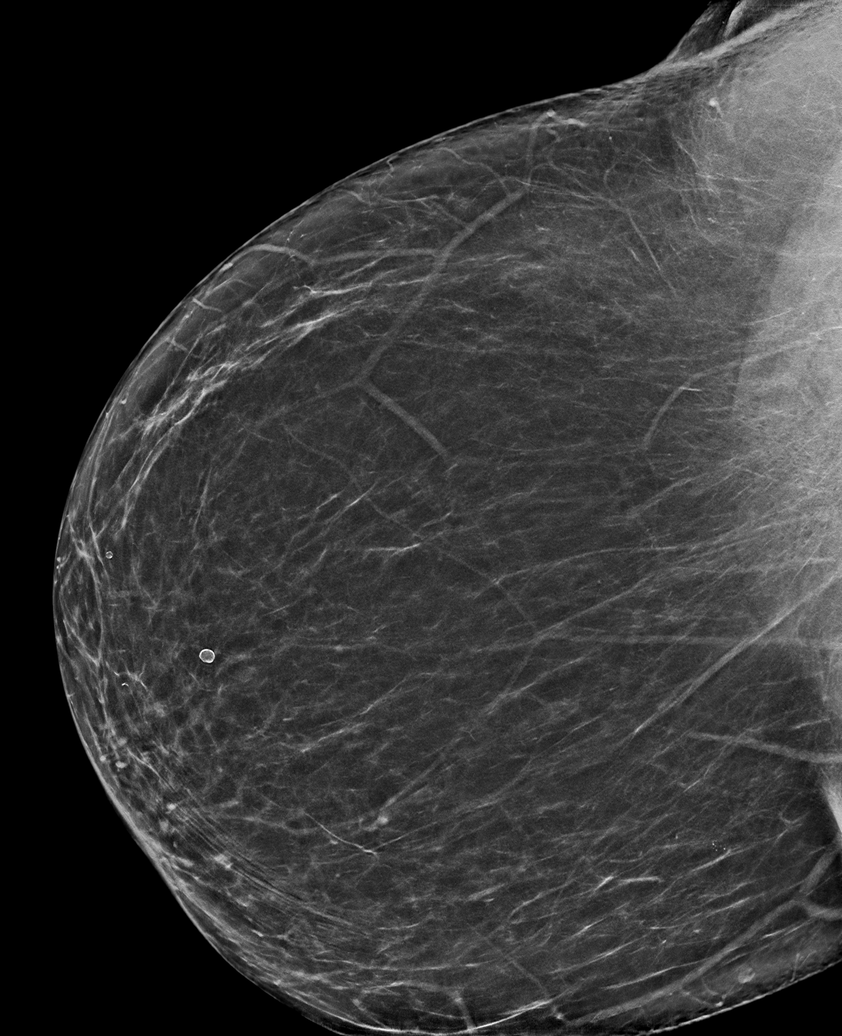

[R CC synth-2D]
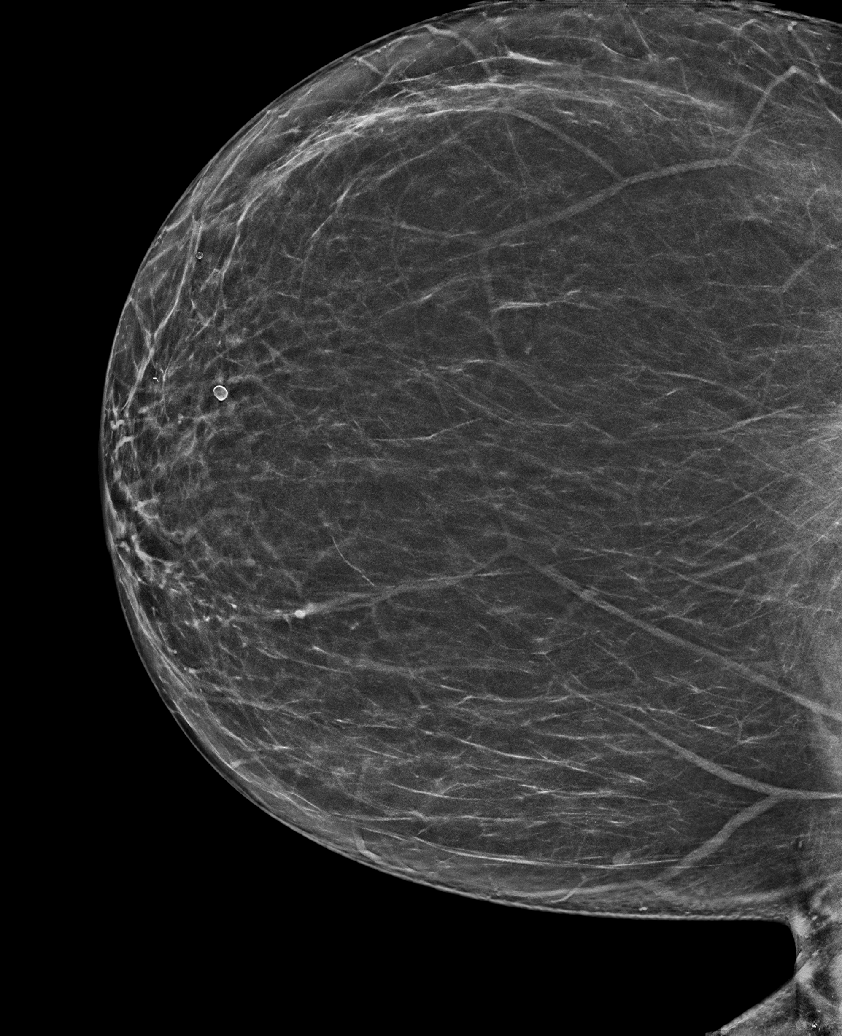

[R XCCL tomo · tomo slice 40/79.0]
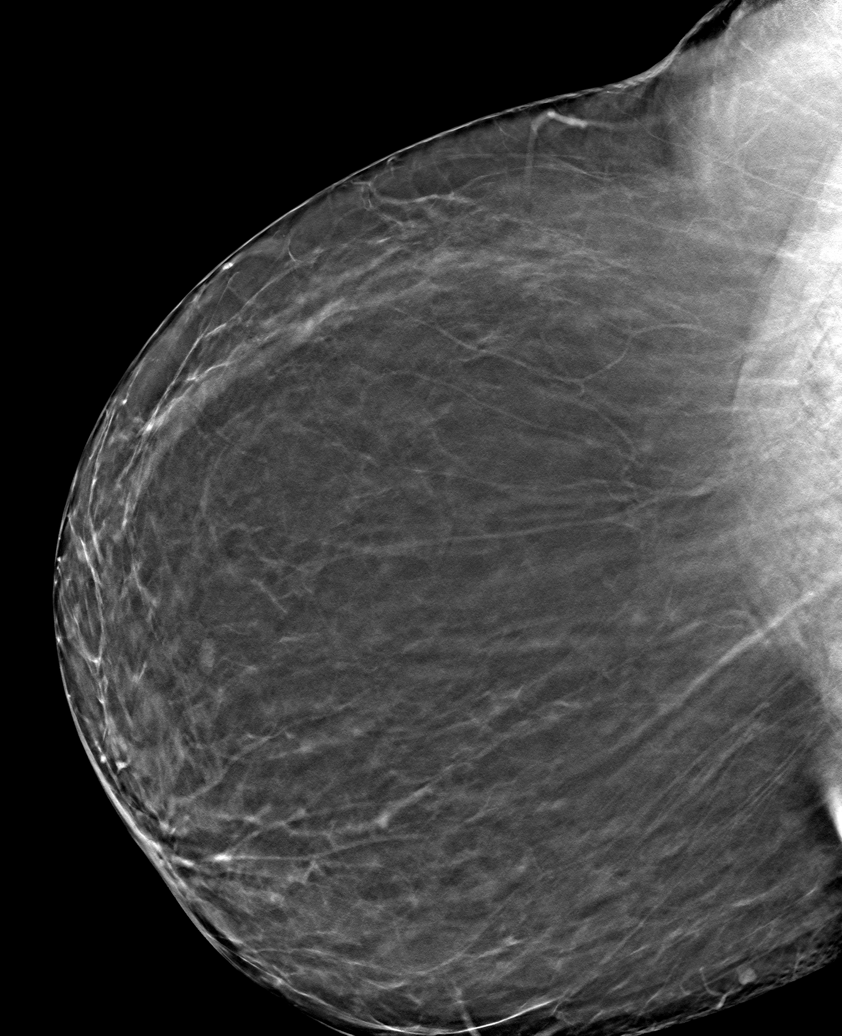

[6 of 30 positions shown; findings below may reference images not displayed]

ACR Breast Density Category b: There are scattered areas of
fibroglandular density.
FINDINGS: There are no findings suspicious for malignancy. The images were
evaluated with computer-aided detection.
IMPRESSION: No mammographic evidence of malignancy. A result letter of this
screening mammogram will be mailed directly to the patient.

RECOMMENDATION:
Screening mammogram in one year. (Code:[id number]-[id number])

BI-RADS CATEGORY  1: Negative.

## 2021-10-14 ENCOUNTER — Ambulatory Visit (INDEPENDENT_AMBULATORY_CARE_PROVIDER_SITE_OTHER): Payer: Medicare HMO

## 2021-10-14 DIAGNOSIS — E785 Hyperlipidemia, unspecified: Secondary | ICD-10-CM

## 2021-10-14 NOTE — Chronic Care Management (AMB) (Signed)
Chronic Care Management   CCM RN Visit Note  10/14/2021 Name: Danielle Le MRN: [id number] DOB: 12/12/1955  Subjective: Danielle Le is a 65 y.o. year old female who is a primary care patient of Danielle Le, [place]. The care management team was consulted for assistance with disease management and care coordination needs.    Engaged with patient by telephone for follow up visit in response to provider referral for case management and care coordination services.   Consent to Services:  The patient was given information about Chronic Care Management services, agreed to services, and gave verbal consent prior to initiation of services.  Please see initial visit note for detailed documentation.   Assessment: Review of patient past medical history, allergies, medications, health status, including review of consultants reports, laboratory and other test data, was performed as part of comprehensive evaluation and provision of chronic care management services.   SDOH (Social Determinants of Health) assessments and interventions performed: No   CCM Care Plan  No Known Allergies  Outpatient Encounter Medications as of 10/14/2021  Medication Sig Note   aspirin EC 81 MG tablet Take 81 mg by mouth daily.    Cholecalciferol (VITAMIN D) 50 MCG (2000 UT) CAPS Take 1 capsule by mouth daily. 01/29/2021: Pt taking 5000 iu   ibandronate (BONIVA) 150 MG tablet Take 1 tablet (150 mg total) by mouth every 30 (thirty) days. Take in the morning with a full glass of water, on an empty stomach, and do not take anything else by mouth or lie down for the next 30 min.    omeprazole (PRILOSEC) 10 MG capsule Take 10 mg by mouth daily.    rosuvastatin (CRESTOR) 10 MG tablet Take 1 tablet (10 mg total) by mouth daily.    zinc gluconate 50 MG tablet Take 50 mg by mouth daily.    No facility-administered encounter medications on file as of 10/14/2021.    Patient Active Problem List   Diagnosis Date Noted   Osteoporosis  without current pathological fracture 05/22/2021   Class 2 severe obesity with serious comorbidity and body mass index (BMI) of 37.0 to 37.9 in adult [facility]) 05/22/2021   Hyperparathyroidism ([place]) 07/22/2020   Hypercalcemia 02/06/2020   Vitamin D deficiency 02/06/2020   Hypertriglyceridemia 02/06/2020   Cardiac murmur 01/05/2019   Iron deficiency anemia 01/05/2019   Hypercholesteremia 01/05/2019   Obesity (BMI 35.0-39.9 without comorbidity) 12/18/2018   Family history of high cholesterol 12/18/2018   Family history of dementia 12/18/2018    Patient Care Plan: RN Care Management Plan of Care     Problem Identified: HLD, GERD, Osteoporosis      Long-Range Goal: Disease Progression Prevented or Minimized   Start Date: 10/14/2021  Expected End Date: 01/12/2022  Priority: High  Note:   Current Barriers:  Chronic Disease Management support and education needs related to HLD, GERD, and Osteoporosis .  RNCM Clinical Goal(s):  Patient will demonstrate Ongoing adherence to prescribed treatment plan for HLD, GERD, and Osteoporosis through collaboration with the provider RN Care Manager and the care management team.   Interventions: 1:1 collaboration with primary care provider regarding development and update of comprehensive plan of care as evidenced by provider attestation and co-signature Inter-disciplinary care team collaboration (see longitudinal plan of care) Evaluation of current treatment plan related to  self management and patient's adherence to plan as established by provider   Hyperlipidemia Interventions:  (Status:  Goal on track:  Yes.) Long Term Goal Lab Results  Component Value Date  CHOL 147 10/02/2020   HDL 52 10/02/2020   LDLCALC 72 10/02/2020   TRIG 152 (H) 10/02/2020   CHOLHDL 2.8 10/02/2020  Medications reviewed. Reports taking as prescribed. Tolerating regimen without concerning side effects. Reviewed established cholesterol goals Discussed importance of  regular laboratory monitoring. Will complete labs within the next 2 months. Reviewed role and benefits of statins for ASCVD risk reduction Discussed strategies to manage statin-induced myalgias Reviewed importance of limiting foods high in cholesterol  Patient Goals/Self-Care Activities: Take all medications as prescribed Attend all scheduled provider appointments Call pharmacy for medication refills 3-7 days in advance of running out of medications Continue to perform all self care activities and IADL's independently  Call provider office for new concerns or questions    Follow Up Plan:   Will follow up in three months      PLAN A member of the care management team will follow up in three months.   Danielle Le [facility] Care Management [facility] [phone]

## 2021-10-14 NOTE — Patient Instructions (Addendum)
°  Thank you for allowing the Chronic Care Management team to participate in your care.  It was great speaking with you today! Please don't hesitate to contact me if I can be of assistance prior to our next scheduled telephone outreach.   Our next appointment is on 01/27/2022 at 3 pm.  Please call the care guide team at [phone] if you need to cancel or reschedule your appointment.    Cristy Friedlander [facility] Care Management [facility] [phone]

## 2021-10-31 DIAGNOSIS — E785 Hyperlipidemia, unspecified: Secondary | ICD-10-CM

## 2021-11-20 DIAGNOSIS — M1712 Unilateral primary osteoarthritis, left knee: Secondary | ICD-10-CM | POA: Diagnosis not present

## 2021-11-20 DIAGNOSIS — M25572 Pain in left ankle and joints of left foot: Secondary | ICD-10-CM | POA: Diagnosis not present

## 2021-12-01 DIAGNOSIS — H35343 Macular cyst, hole, or pseudohole, bilateral: Secondary | ICD-10-CM | POA: Diagnosis not present

## 2021-12-06 IMAGING — CR DG ELBOW COMPLETE 3+V*R*
1 series · 4 of 4 positions shown · non-contrast
Comparison: None.

CLINICAL DATA: Right elbow swelling, tenderness

EXAM:
RIGHT ELBOW - COMPLETE 3+ VIEW

[Series 1: dg elbow complete right (3+view) · 0.14mm/px · 4 of 4 slices shown]
[im 1/4]
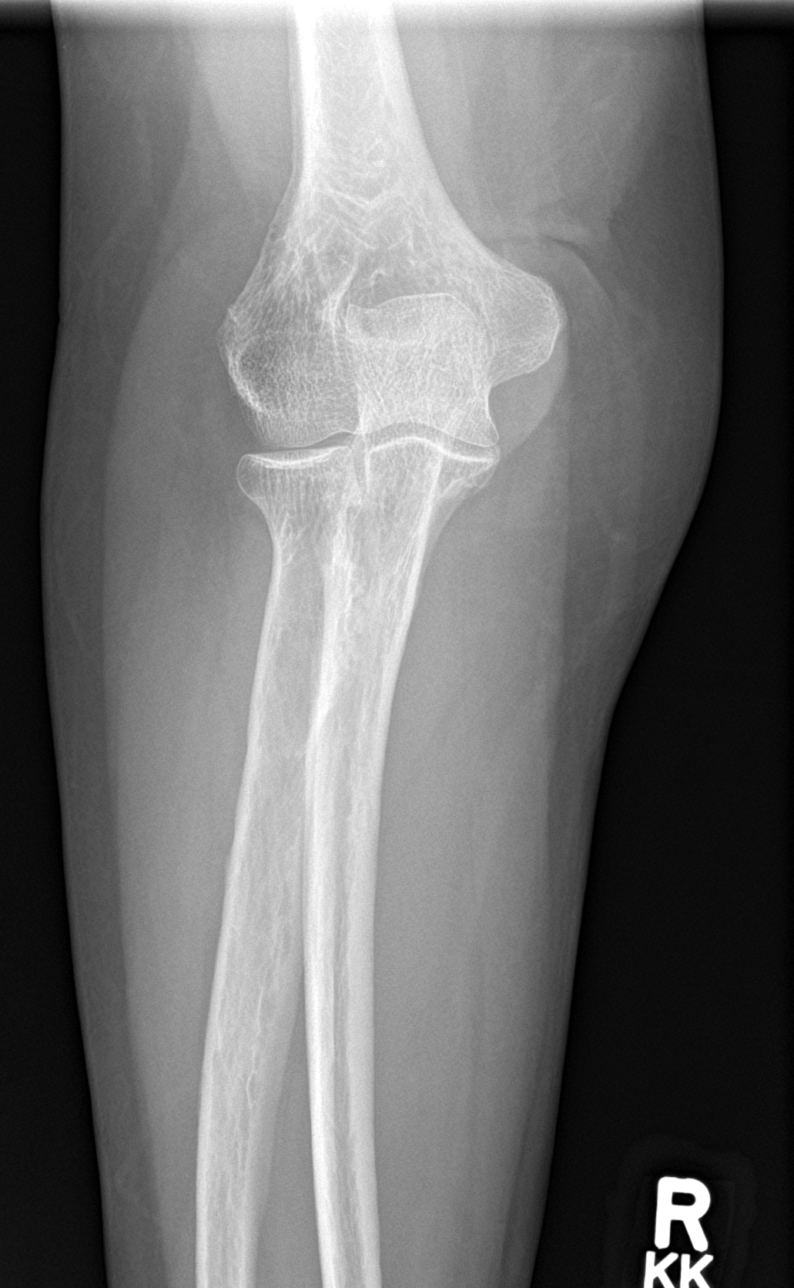
[im 2/4]
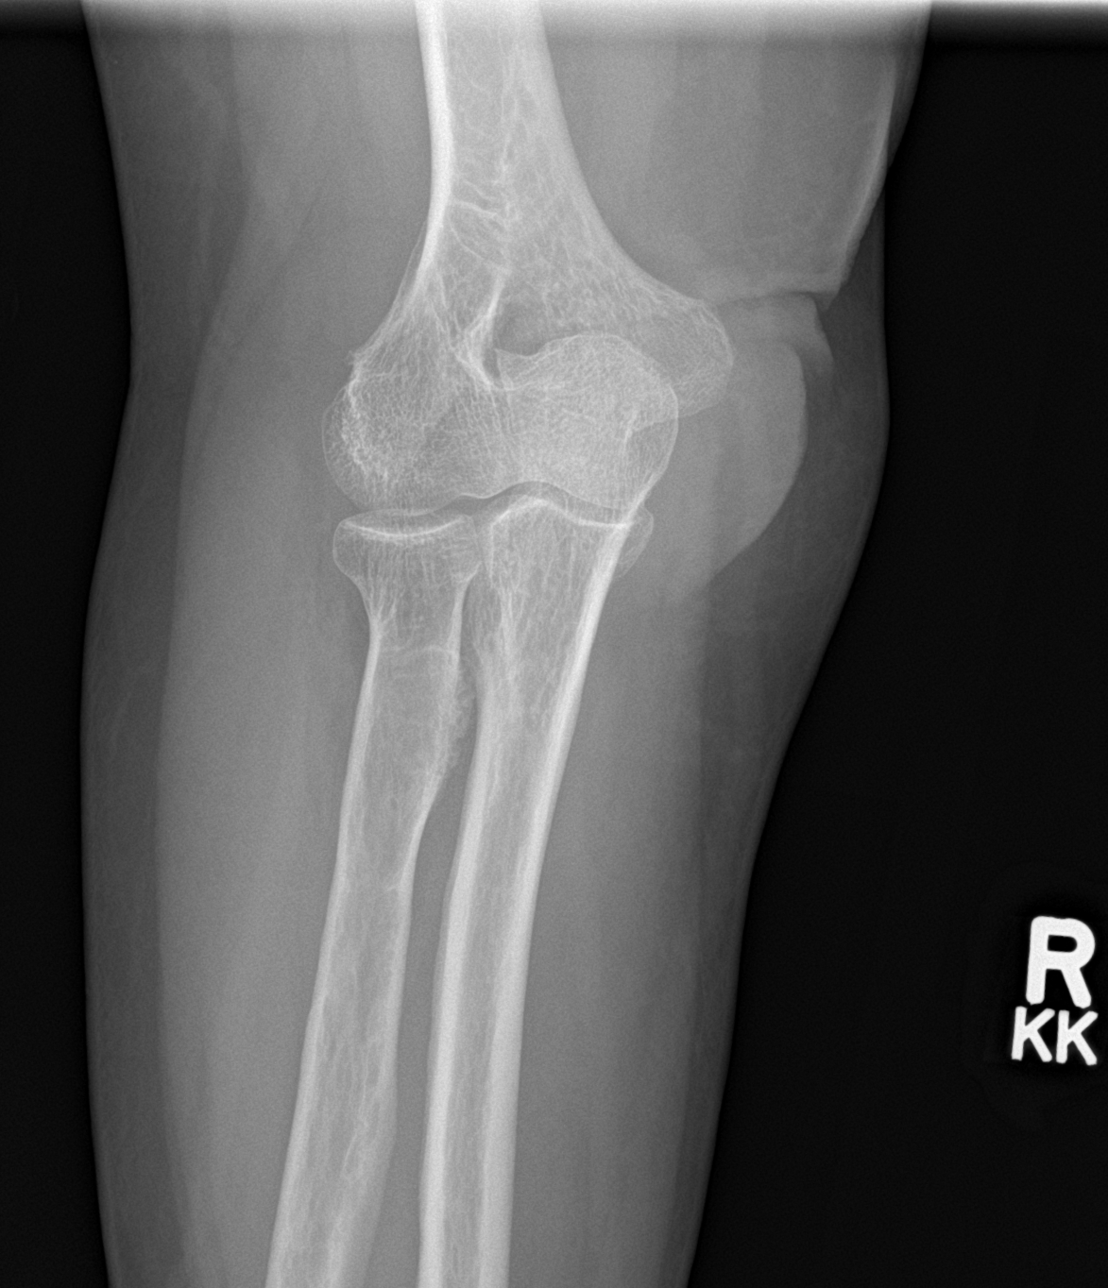
[im 3/4]
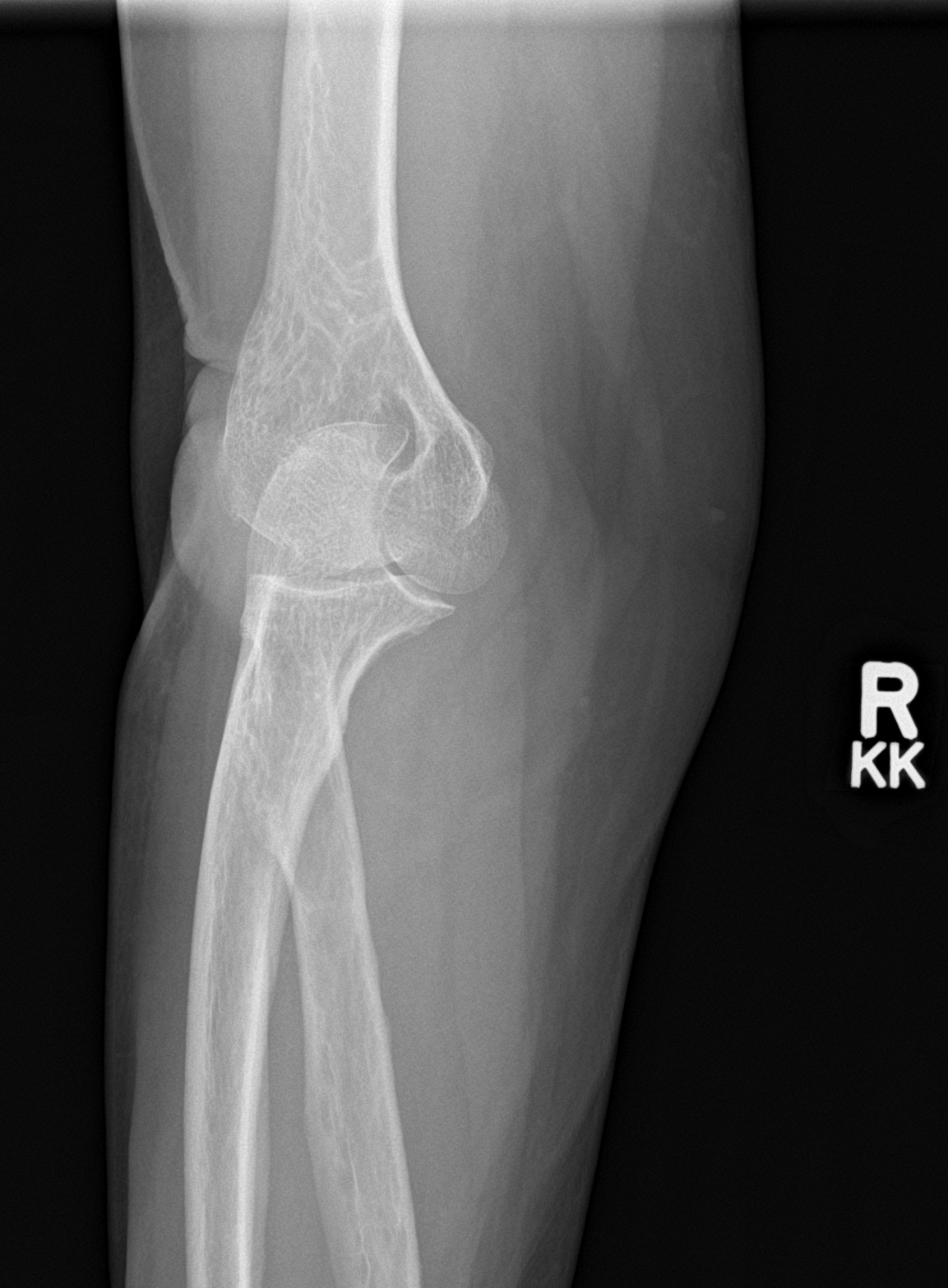
[im 4/4]
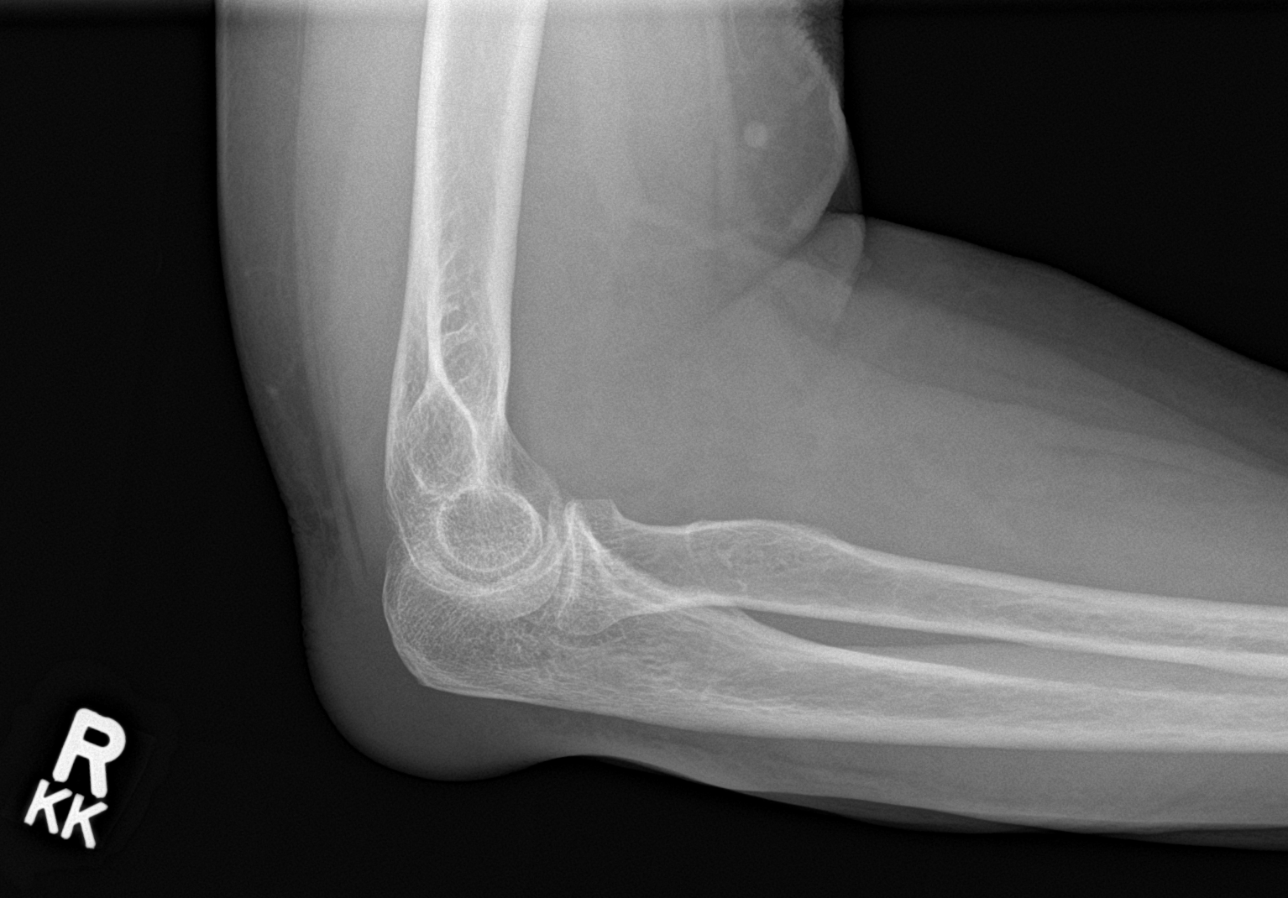

[4 of 4 positions shown; findings below may reference images not displayed]

FINDINGS: There is no evidence of fracture, dislocation, or joint effusion.
There is no evidence of arthropathy or other focal bone abnormality.
Soft tissues are unremarkable.
IMPRESSION: Negative.

## 2022-01-01 DIAGNOSIS — M1712 Unilateral primary osteoarthritis, left knee: Secondary | ICD-10-CM | POA: Diagnosis not present

## 2022-01-13 DIAGNOSIS — Z6837 Body mass index (BMI) 37.0-37.9, adult: Secondary | ICD-10-CM | POA: Diagnosis not present

## 2022-01-13 DIAGNOSIS — E785 Hyperlipidemia, unspecified: Secondary | ICD-10-CM | POA: Diagnosis not present

## 2022-01-13 DIAGNOSIS — Z5181 Encounter for therapeutic drug level monitoring: Secondary | ICD-10-CM | POA: Diagnosis not present

## 2022-01-13 DIAGNOSIS — D649 Anemia, unspecified: Secondary | ICD-10-CM | POA: Diagnosis not present

## 2022-01-13 DIAGNOSIS — Z1159 Encounter for screening for other viral diseases: Secondary | ICD-10-CM | POA: Diagnosis not present

## 2022-01-13 DIAGNOSIS — M810 Age-related osteoporosis without current pathological fracture: Secondary | ICD-10-CM | POA: Diagnosis not present

## 2022-01-13 DIAGNOSIS — E213 Hyperparathyroidism, unspecified: Secondary | ICD-10-CM | POA: Diagnosis not present

## 2022-01-13 DIAGNOSIS — E559 Vitamin D deficiency, unspecified: Secondary | ICD-10-CM | POA: Diagnosis not present

## 2022-01-14 ENCOUNTER — Telehealth: Payer: Self-pay

## 2022-01-14 LAB — CBC WITH DIFFERENTIAL/PLATELET
Absolute Monocytes: 534 cells/uL (ref 200–950)
Basophils Absolute: 29 cells/uL (ref 0–200)
Basophils Relative: 0.6 %
Eosinophils Absolute: 304 cells/uL (ref 15–500)
Eosinophils Relative: 6.2 %
HCT: 40.5 % (ref 35.0–45.0)
Hemoglobin: 13.2 g/dL (ref 11.7–15.5)
Lymphs Abs: 1754 cells/uL (ref 850–3900)
MCH: 26.6 pg — ABNORMAL LOW (ref 27.0–33.0)
MCHC: 32.6 g/dL (ref 32.0–36.0)
MCV: 81.5 fL (ref 80.0–100.0)
MPV: 10.9 fL (ref 7.5–12.5)
Monocytes Relative: 10.9 %
Neutro Abs: 2279 cells/uL (ref 1500–7800)
Neutrophils Relative %: 46.5 %
Platelets: 241 10*3/uL (ref 140–400)
RBC: 4.97 10*6/uL (ref 3.80–5.10)
RDW: 13.6 % (ref 11.0–15.0)
Total Lymphocyte: 35.8 %
WBC: 4.9 10*3/uL (ref 3.8–10.8)

## 2022-01-14 LAB — COMPLETE METABOLIC PANEL WITH GFR
AG Ratio: 1.7 (calc) (ref 1.0–2.5)
ALT: 25 U/L (ref 6–29)
AST: 30 U/L (ref 10–35)
Albumin: 4.5 g/dL (ref 3.6–5.1)
Alkaline phosphatase (APISO): 81 U/L (ref 37–153)
BUN: 14 mg/dL (ref 7–25)
CO2: 30 mmol/L (ref 20–32)
Calcium: 11.8 mg/dL — ABNORMAL HIGH (ref 8.6–10.4)
Chloride: 103 mmol/L (ref 98–110)
Creat: 0.68 mg/dL (ref 0.50–1.05)
Globulin: 2.7 g/dL (calc) (ref 1.9–3.7)
Glucose, Bld: 95 mg/dL (ref 65–99)
Potassium: 4.7 mmol/L (ref 3.5–5.3)
Sodium: 138 mmol/L (ref 135–146)
Total Bilirubin: 0.6 mg/dL (ref 0.2–1.2)
Total Protein: 7.2 g/dL (ref 6.1–8.1)
eGFR: 95 mL/min/{1.73_m2} (ref >=60)

## 2022-01-14 LAB — LIPID PANEL
Cholesterol: 154 mg/dL (ref <200)
HDL: 59 mg/dL (ref >=50)
LDL Cholesterol (Calc): 76 mg/dL (calc)
Non-HDL Cholesterol (Calc): 95 mg/dL (calc) (ref <130)
Total CHOL/HDL Ratio: 2.6 (calc) (ref <5.0)
Triglycerides: 100 mg/dL (ref <150)

## 2022-01-14 LAB — PARATHYROID HORMONE, INTACT (NO CA): PTH: 208 pg/mL — ABNORMAL HIGH (ref 16–77)

## 2022-01-14 LAB — HEPATITIS C ANTIBODY
Hepatitis C Ab: NONREACTIVE
SIGNAL TO CUT-OFF: 0.02 (ref <1.00)

## 2022-01-14 LAB — VITAMIN D 25 HYDROXY (VIT D DEFICIENCY, FRACTURES): Vit D, 25-Hydroxy: 43 ng/mL (ref 30–100)

## 2022-01-14 NOTE — Telephone Encounter (Signed)
Copied from [place] [phone]. Topic: General - Call Back - No Documentation >> Jan 14, 2022  4:51 PM Erick Blinks wrote: Reason for CRM: Pt returned call for lab results, please advise if PEC may disclose. Tried calling office.

## 2022-01-15 NOTE — Telephone Encounter (Signed)
2nd attempt to call pt again, no answer left vm to call back. Created CRM for triage nurse to release results if she calls back. ?

## 2022-01-20 ENCOUNTER — Encounter: Payer: Self-pay | Admitting: Endocrinology

## 2022-01-20 ENCOUNTER — Ambulatory Visit: Payer: Medicare HMO | Admitting: Endocrinology

## 2022-01-20 ENCOUNTER — Other Ambulatory Visit: Payer: Self-pay

## 2022-01-20 VITALS — BP 154/98 | HR 90 | Ht 63.0 in | Wt 215.6 lb

## 2022-01-20 DIAGNOSIS — E213 Hyperparathyroidism, unspecified: Secondary | ICD-10-CM

## 2022-01-20 DIAGNOSIS — M810 Age-related osteoporosis without current pathological fracture: Secondary | ICD-10-CM

## 2022-01-20 NOTE — Progress Notes (Signed)
? ?Subjective:  ? ? Patient ID: Danielle Le, female    DOB: 31-Mar-1956, 66 y.o.   MRN: [id number] ? ?HPI ?Pt returns for f/u of primary hyperparathyroidism (dx'ed 2020; she has never had urolithiasis, or bony fracture; PTH is high; she takes Vit-D, 5000 units/d; other w/u of hypercalcemia was neg; she started ibandronate for osteoporosis 4/22).  She takes Vit-D, 5000 units/d, as rx'ed.  Denies falls.   ?Past Medical History:  ?Diagnosis Date  ? Anemia   ? Cataract   ? ? ?Past Surgical History:  ?Procedure Laterality Date  ? ABDOMINAL HYSTERECTOMY    ? CATARACT EXTRACTION W/PHACO Left 07/04/2019  ? Procedure: CATARACT EXTRACTION PHACO AND INTRAOCULAR LENS PLACEMENT ([place]) left  00:43.2  12.4%  5.40;  Surgeon: Leandrew Koyanagi, MD;  Location: [place];  Service: Ophthalmology;  Laterality: Left;  ? CATARACT EXTRACTION W/PHACO Right 07/25/2019  ? Procedure: CATARACT EXTRACTION PHACO AND INTRAOCULAR LENS PLACEMENT ([facility]) RIGHT  00:41.9  12.4%  5.18;  Surgeon: Leandrew Koyanagi, MD;  Location: [place];  Service: Ophthalmology;  Laterality: Right;  ? CESAREAN SECTION    ? COLONOSCOPY WITH PROPOFOL N/A 01/04/2019  ? Procedure: COLONOSCOPY WITH PROPOFOL;  Surgeon: Lin Landsman, MD;  Location: [facility] ENDOSCOPY;  Service: Gastroenterology;  Laterality: N/A;  ? ESOPHAGOGASTRODUODENOSCOPY ([facility]) WITH PROPOFOL N/A 01/04/2019  ? Procedure: ESOPHAGOGASTRODUODENOSCOPY ([facility]) WITH PROPOFOL;  Surgeon: Lin Landsman, MD;  Location: [facility] ENDOSCOPY;  Service: Gastroenterology;  Laterality: N/A;  ? ? ?Social History  ? ?Socioeconomic History  ? Marital status: Divorced  ?  Spouse name: Not on file  ? Number of children: 1  ? Years of education: 36  ? Highest education level: Associate degree: occupational, Hotel manager, or vocational program  ?Occupational History  ? Occupation: stay home mom  ?Tobacco Use  ? Smoking status: Never  ? Smokeless tobacco: Never  ?Vaping Use  ? Vaping Use: Never used  ?Substance and  Sexual Activity  ? Alcohol use: Not Currently  ? Drug use: Not Currently  ? Sexual activity: Not Currently  ?Other Topics Concern  ? Not on file  ?Social History Narrative  ? Pt has 68 year old special needs son that lives at home who is non ambulatory/non verbal and she is primary caregiver.   ? ?Social Determinants of Health  ? ?Financial Resource Strain: Low Risk   ? Difficulty of Paying Living Expenses: Not very hard  ?Food Insecurity: No Food Insecurity  ? Worried About Charity fundraiser in the Last Year: Never true  ? Ran Out of Food in the Last Year: Never true  ?Transportation Needs: No Transportation Needs  ? Lack of Transportation (Medical): No  ? Lack of Transportation (Non-Medical): No  ?Physical Activity: Inactive  ? Days of Exercise per Week: 0 days  ? Minutes of Exercise per Session: 0 min  ?Stress: No Stress Concern Present  ? Feeling of Stress : Only a little  ?Social Connections: Socially Isolated  ? Frequency of Communication with Friends and Family: More than three times a week  ? Frequency of Social Gatherings with Friends and Family: Twice a week  ? Attends Religious Services: Never  ? Active Member of Clubs or Organizations: No  ? Attends Archivist Meetings: Never  ? Marital Status: Divorced  ?Intimate Partner Violence: Not At Risk  ? Fear of Current or Ex-Partner: No  ? Emotionally Abused: No  ? Physically Abused: No  ? Sexually Abused: No  ? ? ?Current Outpatient  Medications on File Prior to Visit  ?Medication Sig Dispense Refill  ? aspirin EC 81 MG tablet Take 81 mg by mouth daily.    ? Cholecalciferol (VITAMIN D) 50 MCG (2000 UT) CAPS Take 1 capsule by mouth daily.    ? ibandronate (BONIVA) 150 MG tablet Take 1 tablet (150 mg total) by mouth every 30 (thirty) days. Take in the morning with a full glass of water, on an empty stomach, and do not take anything else by mouth or lie down for the next 30 min. 3 tablet 3  ? meloxicam (MOBIC) 15 MG tablet Take 15 mg by mouth daily.     ? omeprazole (PRILOSEC) 10 MG capsule Take 10 mg by mouth daily.    ? rosuvastatin (CRESTOR) 10 MG tablet Take 1 tablet (10 mg total) by mouth daily. 90 tablet 3  ? zinc gluconate 50 MG tablet Take 50 mg by mouth daily.    ? ?No current facility-administered medications on file prior to visit.  ? ? ?No Known Allergies ? ?Family History  ?Problem Relation Age of Onset  ? Hypertension Mother   ? Hyperlipidemia Mother   ? Dementia Father   ? Hypertension Father   ? AVM Brother   ? Anxiety disorder Son   ? Seizures Son   ? Dementia Paternal Grandfather   ? Hypertension Brother   ? Hypercalcemia Neg Hx   ? ? ?BP (!) 154/98 (BP Location: Left Arm, Patient Position: Sitting, Cuff Size: Normal)   Pulse 90   Ht '5\' 3"'$  (1.6 m)   Wt 215 lb 9.6 oz (97.8 kg)   SpO2 100%   BMI 38.19 kg/m?  ? ? ?Review of Systems ?She has arthralgias, but no numbness or muscle cramps.   ?   ?Objective:  ? Physical Exam ?VITAL SIGNS:  See vs page ?GENERAL: no distress ?GAIT is normal and steady ? ? ?25-OH Vit-D=43 ?Lab Results  ?Component Value Date  ? PTH 208 (H) 01/13/2022  ? CALCIUM 11.8 (H) 01/13/2022  ? PHOS 3.1 07/22/2020  ? ? ?   ?Assessment & Plan:  ?Primary hyperparathyroidism: uncontrolled ?Osteoporosis, due for recheck soon ? ?Patient Instructions  ?Your blood pressure is high today.  Please see your primary care provider soon, to have it rechecked ?Please continue the same ibandronate.   ?Please see a surgery specialist.  you will receive a phone call, about a day and time for an appointment.   ?Let's recheck the bone density.  you will receive a phone call, about a day and time for an appointment. ?Please come back for a follow-up appointment in 6 months.   ? ? ?

## 2022-01-20 NOTE — Patient Instructions (Addendum)
Your blood pressure is high today.  Please see your primary care provider soon, to have it rechecked ?Please continue the same ibandronate.   ?Please see a surgery specialist.  you will receive a phone call, about a day and time for an appointment.   ?Let's recheck the bone density.  you will receive a phone call, about a day and time for an appointment. ?Please come back for a follow-up appointment in 6 months.   ?

## 2022-01-27 ENCOUNTER — Ambulatory Visit (INDEPENDENT_AMBULATORY_CARE_PROVIDER_SITE_OTHER): Payer: Medicare HMO

## 2022-01-27 DIAGNOSIS — E213 Hyperparathyroidism, unspecified: Secondary | ICD-10-CM

## 2022-01-27 DIAGNOSIS — M810 Age-related osteoporosis without current pathological fracture: Secondary | ICD-10-CM

## 2022-01-27 DIAGNOSIS — E785 Hyperlipidemia, unspecified: Secondary | ICD-10-CM

## 2022-01-27 NOTE — Chronic Care Management (AMB) (Addendum)
Chronic Care Management   CCM RN Visit Note  01/27/2022 Name: Danielle Le MRN: [id number] DOB: 1956/09/20  Subjective: Danielle Le is a 66 y.o. year old female who is a primary care patient of Delsa Grana, [place]. The care management team was consulted for assistance with disease management and care coordination needs.    Engaged with patient by telephone for follow up visit in response to provider referral for case management and care coordination services.   Consent to Services:  The patient was given information about Chronic Care Management services, agreed to services, and gave verbal consent prior to initiation of services.  Please see initial visit note for detailed documentation.   Assessment: Review of patient past medical history, allergies, medications, health status, including review of consultants reports, laboratory and other test data, was performed as part of comprehensive evaluation and provision of chronic care management services.   SDOH (Social Determinants of Health) assessments and interventions performed:  No  CCM Care Plan  No Known Allergies  Outpatient Encounter Medications as of 01/27/2022  Medication Sig Note   aspirin EC 81 MG tablet Take 81 mg by mouth daily.    Cholecalciferol (VITAMIN D) 50 MCG (2000 UT) CAPS Take 1 capsule by mouth daily. 01/29/2021: Pt taking 5000 iu   ibandronate (BONIVA) 150 MG tablet Take 1 tablet (150 mg total) by mouth every 30 (thirty) days. Take in the morning with a full glass of water, on an empty stomach, and do not take anything else by mouth or lie down for the next 30 min.    meloxicam (MOBIC) 15 MG tablet Take 15 mg by mouth daily.    omeprazole (PRILOSEC) 10 MG capsule Take 10 mg by mouth daily.    rosuvastatin (CRESTOR) 10 MG tablet Take 1 tablet (10 mg total) by mouth daily.    zinc gluconate 50 MG tablet Take 50 mg by mouth daily.    No facility-administered encounter medications on file as of 01/27/2022.    Patient  Active Problem List   Diagnosis Date Noted   Osteoporosis without current pathological fracture 05/22/2021   Class 2 severe obesity with serious comorbidity and body mass index (BMI) of 37.0 to 37.9 in adult [facility]) 05/22/2021   Hyperparathyroidism ([place]) 07/22/2020   Hypercalcemia 02/06/2020   Vitamin D deficiency 02/06/2020   Hypertriglyceridemia 02/06/2020   Cardiac murmur 01/05/2019   Iron deficiency anemia 01/05/2019   Hypercholesteremia 01/05/2019   Obesity (BMI 35.0-39.9 without comorbidity) 12/18/2018   Family history of high cholesterol 12/18/2018   Family history of dementia 12/18/2018   Patient Care Plan: RN Care Management Plan of Care     Problem Identified: HLD, GERD, Osteoporosis      Long-Range Goal: Disease Progression Prevented or Minimized   Start Date: 10/14/2021  Expected End Date: 01/12/2022  Priority: High  Note:   Current Barriers:  Chronic Disease Management support and education needs related to HLD, GERD, and Osteoporosis .  RNCM Clinical Goal(s):  Patient will demonstrate Ongoing adherence to prescribed treatment plan for HLD, GERD, and Osteoporosis through collaboration with the provider RN Care Manager and the care management team.   Interventions: 1:1 collaboration with primary care provider regarding development and update of comprehensive plan of care as evidenced by provider attestation and co-signature Inter-disciplinary care team collaboration (see longitudinal plan of care) Evaluation of current treatment plan related to  self management and patient's adherence to plan as established by provider   Hyperlipidemia Interventions:   Reviewed plan  for hyperlipidemia management.  Reviewed established cholesterol goals Discussed importance of regular laboratory monitoring.  Reviewed role and benefits of statins. Reports taking as prescribed and tolerating well. Discussed activity goals. Reports activity tolerance is currently limited d/t pain r/t  arthritis. Advised to engage in low impact activity as tolerated. Discussed nutritional intake. Discussed importance of avoid highly processed foods and limiting foods high in cholesterol  Hyperparathyrioidim Interventions:   Reviewed plan for management of hyperparathyroidism. Reports being evaluated by Dr. Ellison/Endocrinology. Her calcium levels are a concern. Discussed plan for required surgical evaluation. Confirmed speaking with the team at [facility] in [place]. Agreed to follow up as scheduled. Will outreach to discuss plan for surgery and anticipated care management needs.  Patient Goals/Self-Care Activities: Take all medications as prescribed Attend all scheduled provider appointments Call pharmacy for medication refills 3-7 days in advance of running out of medications Continue to perform all self care activities and IADL's independently  Call provider office for new concerns or questions    Follow Up Plan:   Will follow up in three months        PLAN: A member of the care management team will follow up within the next three months.   [facility] Care Management [facility] [phone]

## 2022-01-29 DIAGNOSIS — E785 Hyperlipidemia, unspecified: Secondary | ICD-10-CM

## 2022-01-29 DIAGNOSIS — M810 Age-related osteoporosis without current pathological fracture: Secondary | ICD-10-CM

## 2022-02-02 ENCOUNTER — Ambulatory Visit (INDEPENDENT_AMBULATORY_CARE_PROVIDER_SITE_OTHER): Payer: Medicare HMO

## 2022-02-02 ENCOUNTER — Ambulatory Visit: Payer: Medicare HMO

## 2022-02-02 DIAGNOSIS — Z0000 Encounter for general adult medical examination without abnormal findings: Secondary | ICD-10-CM

## 2022-02-02 DIAGNOSIS — Z1231 Encounter for screening mammogram for malignant neoplasm of breast: Secondary | ICD-10-CM

## 2022-02-02 NOTE — Patient Instructions (Signed)
Danielle Le , ?Thank you for taking time to come for your Medicare Wellness Visit. I appreciate your ongoing commitment to your health goals. Please review the following plan we discussed and let me know if I can assist you in the future.  ? ?Screening recommendations/referrals: ?Colonoscopy: done 01/04/19. Repeat 12/2025 ?Mammogram: done 02/24/21. Please call [phone] to schedule your mammogram.  ?Bone Density: done 02/24/21. Due 02/26/23 ?Recommended yearly ophthalmology/optometry visit for glaucoma screening and checkup ?Recommended yearly dental visit for hygiene and checkup ? ?Vaccinations: ?Influenza vaccine: declined ?Pneumococcal vaccine: declined ?Tdap vaccine: due ?Shingles vaccine: Shingrix discussed. Please contact your pharmacy for coverage information.  ?Covid-19:done 01/11/20, 02/06/20 & 09/08/20 ? ?Advanced directives: Advance directive discussed with you today. I have provided a copy for you to complete at home and have notarized. Once this is complete please bring a copy in to our office so we can scan it into your chart.  ? ?Conditions/risks identified: Recommend physical activity as tolerated ? ?Next appointment: Follow up in one year for your annual wellness visit  ? ? ?Preventive Care 52 Years and Older, Female ?Preventive care refers to lifestyle choices and visits with your health care provider that can promote health and wellness. ?What does preventive care include? ?A yearly physical exam. This is also called an annual well check. ?Dental ex/ams once or twice a year. ?Routine eye exams. Ask your health care provider how often you should have your eyes checked. ?[place] lifestyle choices, including: ?Daily care of your teeth and gums. ?Regular physical activity. ?Eating a healthy diet. ?Avoiding tobacco and drug use. ?Limiting alcohol use. ?Practicing safe sex. ?Taking low-dose aspirin every day. ?Taking vitamin and mineral supplements as recommended by your health care provider. ?What happens  during an annual well check? ?The services and screenings done by your health care provider during your annual well check will depend on your age, overall health, lifestyle risk factors, and family history of disease. ?Counseling  ?Your health care provider may ask you questions about your: ?Alcohol use. ?Tobacco use. ?Drug use. ?Emotional well-being. ?Home and relationship well-being. ?Sexual activity. ?Eating habits. ?History of falls. ?Memory and ability to understand (cognition). ?Work and work Statistician. ?Reproductive health. ?Screening  ?You may have the following tests or measurements: ?Height, weight, and BMI. ?Blood pressure. ?Lipid and cholesterol levels. These may be checked every 5 years, or more frequently if you are over 36 years old. ?Skin check. ?Lung cancer screening. You may have this screening every year starting at age 4 if you have a 30-pack-year history of smoking and currently smoke or have quit within the past 15 years. ?Fecal occult blood test (FOBT) of the stool. You may have this test every year starting at age 33. ?Flexible sigmoidoscopy or colonoscopy. You may have a sigmoidoscopy every 5 years or a colonoscopy every 10 years starting at age 31. ?Hepatitis C blood test. ?Hepatitis B blood test. ?Sexually transmitted disease (STD) testing. ?Diabetes screening. This is done by checking your blood sugar (glucose) after you have not eaten for a while (fasting). You may have this done every 1-3 years. ?Bone density scan. This is done to screen for osteoporosis. You may have this done starting at age 31. ?Mammogram. This may be done every 1-2 years. Talk to your health care provider about how often you should have regular mammograms. ?Talk with your health care provider about your test results, treatment options, and if necessary, the need for more tests. ?Vaccines  ?Your health care provider may recommend certain vaccines,  such as: ?Influenza vaccine. This is recommended every  year. ?Tetanus, diphtheria, and acellular pertussis (Tdap, Td) vaccine. You may need a Td booster every 10 years. ?Zoster vaccine. You may need this after age 52. ?Pneumococcal 13-valent conjugate (PCV13) vaccine. One dose is recommended after age 38. ?Pneumococcal polysaccharide (PPSV23) vaccine. One dose is recommended after age 6. ?Talk to your health care provider about which screenings and vaccines you need and how often you need them. ?This information is not intended to replace advice given to you by your health care provider. Make sure you discuss any questions you have with your health care provider. ?Document Released: 11/14/2015 Document Revised: 07/07/2016 Document Reviewed: 08/19/2015 ?Elsevier Interactive Patient Education ? 2017 [place]. ? ?Fall Prevention in the Home ?Falls can cause injuries. They can happen to people of all ages. There are many things you can do to make your home safe and to help prevent falls. ?What can I do on the outside of my home? ?Regularly fix the edges of walkways and driveways and fix any cracks. ?Remove anything that might make you trip as you walk through a door, such as a raised step or threshold. ?Trim any bushes or trees on the path to your home. ?Use bright outdoor lighting. ?Clear any walking paths of anything that might make someone trip, such as rocks or tools. ?Regularly check to see if handrails are loose or broken. Make sure that both sides of any steps have handrails. ?Any raised decks and porches should have guardrails on the edges. ?Have any leaves, snow, or ice cleared regularly. ?Use sand or salt on walking paths during winter. ?Clean up any spills in your garage right away. This includes oil or grease spills. ?What can I do in the bathroom? ?Use night lights. ?Install grab bars by the toilet and in the tub and shower. Do not use towel bars as grab bars. ?Use non-skid mats or decals in the tub or shower. ?If you need to sit down in the shower, use a  plastic, non-slip stool. ?Keep the floor dry. Clean up any water that spills on the floor as soon as it happens. ?Remove soap buildup in the tub or shower regularly. ?Attach bath mats securely with double-sided non-slip rug tape. ?Do not have throw rugs and other things on the floor that can make you trip. ?What can I do in the bedroom? ?Use night lights. ?Make sure that you have a light by your bed that is easy to reach. ?Do not use any sheets or blankets that are too big for your bed. They should not hang down onto the floor. ?Have a firm chair that has side arms. You can use this for support while you get dressed. ?Do not have throw rugs and other things on the floor that can make you trip. ?What can I do in the kitchen? ?Clean up any spills right away. ?Avoid walking on wet floors. ?Keep items that you use a lot in easy-to-reach places. ?If you need to reach something above you, use a strong step stool that has a grab bar. ?Keep electrical cords out of the way. ?Do not use floor polish or wax that makes floors slippery. If you must use wax, use non-skid floor wax. ?Do not have throw rugs and other things on the floor that can make you trip. ?What can I do with my stairs? ?Do not leave any items on the stairs. ?Make sure that there are handrails on both sides of the stairs and  use them. Fix handrails that are broken or loose. Make sure that handrails are as long as the stairways. ?Check any carpeting to make sure that it is firmly attached to the stairs. Fix any carpet that is loose or worn. ?Avoid having throw rugs at the top or bottom of the stairs. If you do have throw rugs, attach them to the floor with carpet tape. ?Make sure that you have a light switch at the top of the stairs and the bottom of the stairs. If you do not have them, ask someone to add them for you. ?What else can I do to help prevent falls? ?Wear shoes that: ?Do not have high heels. ?Have rubber bottoms. ?Are comfortable and fit you  well. ?Are closed at the toe. Do not wear sandals. ?If you use a stepladder: ?Make sure that it is fully opened. Do not climb a closed stepladder. ?Make sure that both sides of the stepladder are locked into place. ?Ask

## 2022-02-02 NOTE — Progress Notes (Signed)
? ?Subjective:  ? Danielle Le is a 66 y.o. female who presents for Medicare Annual (Subsequent) preventive examination. ? ?Virtual Visit via Telephone Note ? ?I connected with  Danielle Le on 02/02/22 at  3:30 PM EDT by telephone and verified that I am speaking with the correct person using two identifiers. ? ?Location: ?Patient: home ?Provider: [facility] ?Persons participating in the virtual visit: patient/Nurse Health Advisor ?  ?I discussed the limitations, risks, security and privacy concerns of performing an evaluation and management service by telephone and the availability of in person appointments. The patient expressed understanding and agreed to proceed. ? ?Interactive audio and video telecommunications were attempted between this nurse and patient, however failed, due to patient having technical difficulties OR patient did not have access to video capability.  We continued and completed visit with audio only. ? ?Some vital signs may be absent or patient reported.  ? ?Clemetine Marker, LPN ? ? ?Review of Systems    ? ?Cardiac Risk Factors include: advanced age (>38mn, >>64women);dyslipidemia;obesity (BMI >30kg/m2) ? ?   ?Objective:  ?  ?Today's Vitals  ? 02/02/22 1537  ?PainSc: 9   ? ?There is no height or weight on file to calculate BMI. ? ? ?  02/02/2022  ?  3:43 PM 01/29/2021  ?  3:45 PM 01/18/2020  ?  9:09 AM 07/25/2019  ?  8:46 AM 07/04/2019  ?  7:53 AM 01/04/2019  ?  8:27 AM  ?Advanced Directives  ?Does Patient Have a Medical Advance Directive? No No No No No No  ?Would patient like information on creating a medical advance directive? Yes (MAU/Ambulatory/Procedural Areas - Information given) Yes (MAU/Ambulatory/Procedural Areas - Information given) Yes (MAU/Ambulatory/Procedural Areas - Information given) No - Patient declined Yes (MAU/Ambulatory/Procedural Areas - Information given) No - Patient declined  ? ? ?Current Medications (verified) ?Outpatient Encounter Medications as of 02/02/2022  ?Medication Sig  ?  Cholecalciferol (VITAMIN D) 50 MCG (2000 UT) CAPS Take 1 capsule by mouth daily.  ? ibandronate (BONIVA) 150 MG tablet Take 1 tablet (150 mg total) by mouth every 30 (thirty) days. Take in the morning with a full glass of water, on an empty stomach, and do not take anything else by mouth or lie down for the next 30 min.  ? meloxicam (MOBIC) 15 MG tablet Take 15 mg by mouth daily.  ? omeprazole (PRILOSEC) 10 MG capsule Take 10 mg by mouth daily.  ? rosuvastatin (CRESTOR) 10 MG tablet Take 1 tablet (10 mg total) by mouth daily.  ? zinc gluconate 50 MG tablet Take 50 mg by mouth daily.  ? aspirin EC 81 MG tablet Take 81 mg by mouth daily. (Patient not taking: Reported on 02/02/2022)  ? ?No facility-administered encounter medications on file as of 02/02/2022.  ? ? ?Allergies (verified) ?Patient has no known allergies.  ? ?History: ?Past Medical History:  ?Diagnosis Date  ? Anemia   ? Cataract   ? ?Past Surgical History:  ?Procedure Laterality Date  ? ABDOMINAL HYSTERECTOMY    ? CATARACT EXTRACTION W/PHACO Left 07/04/2019  ? Procedure: CATARACT EXTRACTION PHACO AND INTRAOCULAR LENS PLACEMENT ([place] left  00:43.2  12.4%  5.40;  Surgeon: BLeandrew Koyanagi MD;  [facility]  Service: Ophthalmology;  Laterality: Left;  ? CATARACT EXTRACTION W/PHACO Right 07/25/2019  ? Procedure: CATARACT EXTRACTION PHACO AND INTRAOCULAR LENS PLACEMENT (I[facility]) RIGHT  00:41.9  12.4%  5.18;  Surgeon: BLeandrew Koyanagi MD;  [facility]  Service: Ophthalmology;  Laterality:  Right;  ? CESAREAN SECTION    ? COLONOSCOPY WITH PROPOFOL N/A 01/04/2019  ? Procedure: COLONOSCOPY WITH PROPOFOL;  Surgeon: Lin Landsman, MD;  Location[facility] ENDOSCOPY;  Service: Gastroenterology;  Laterality: N/A;  ? ESOPHAGOGASTRODUODENOSCOPY (EGD) WITH PROPOFOL N/A 01/04/2019  ? Procedure: ESOPHAGOGASTRODUODENOSCOPY (EGD) WITH PROPOFOL;  Surgeon: Lin Landsman, MD;  Location: [facility] ENDOSCOPY;  Service: Gastroenterology;   Laterality: N/A;  ? ?Family History  ?Problem Relation Age of Onset  ? Hypertension Mother   ? Hyperlipidemia Mother   ? Dementia Father   ? Hypertension Father   ? AVM Brother   ? Anxiety disorder Son   ? Seizures Son   ? Dementia Paternal Grandfather   ? Hypertension Brother   ? Hypercalcemia Neg Hx   ? ?Social History  ? ?Socioeconomic History  ? Marital status: Divorced  ?  Spouse name: Not on file  ? Number of children: 1  ? Years of education: 31  ? Highest education level: Associate degree: occupational, Hotel manager, or vocational program  ?Occupational History  ? Occupation: stay home mom  ?Tobacco Use  ? Smoking status: Never  ? Smokeless tobacco: Never  ?Vaping Use  ? Vaping Use: Never used  ?Substance and Sexual Activity  ? Alcohol use: Not Currently  ? Drug use: Not Currently  ? Sexual activity: Not Currently  ?Other Topics Concern  ? Not on file  ?Social History Narrative  ? Pt has 68 year old special needs son that lives at home who is non ambulatory/non verbal and she is primary caregiver.   ? ?Social Determinants of Health  ? ?Financial Resource Strain: Low Risk   ? Difficulty of Paying Living Expenses: Not very hard  ?Food Insecurity: No Food Insecurity  ? Worried About Charity fundraiser in the Last Year: Never true  ? Ran Out of Food in the Last Year: Never true  ?Transportation Needs: No Transportation Needs  ? Lack of Transportation (Medical): No  ? Lack of Transportation (Non-Medical): No  ?Physical Activity: Inactive  ? Days of Exercise per Week: 0 days  ? Minutes of Exercise per Session: 0 min  ?Stress: No Stress Concern Present  ? Feeling of Stress : Not at all  ?Social Connections: Socially Isolated  ? Frequency of Communication with Friends and Family: More than three times a week  ? Frequency of Social Gatherings with Friends and Family: Twice a week  ? Attends Religious Services: Never  ? Active Member of Clubs or Organizations: No  ? Attends Archivist Meetings: Never  ?  Marital Status: Divorced  ? ? ?Tobacco Counseling ?Counseling given: Not Answered ? ? ?Clinical Intake: ? ?Pre-visit preparation completed: Yes ? ?Pain : 0-10 ?Pain Score: 9  ?Pain Type: Chronic pain ?Pain Location: Knee ?Pain Orientation: Left ?Pain Descriptors / Indicators: Aching, Sore ?Pain Onset: More than a month ago ?Pain Frequency: Intermittent ? ?  ? ?Nutritional Status: BMI > 30  Obese ?Nutritional Risks: None ?Diabetes: No ? ?How often do you need to have someone help you when you read instructions, pamphlets, or other written materials from your doctor or pharmacy?: 1 - Never ? ? ? ?Interpreter Needed?: No ? ?Information entered by :: Clemetine Marker LPN ? ? ?Activities of Daily Living ? ?  02/02/2022  ?  3:44 PM 05/22/2021  ?  2:36 PM  ?In your present state of health, do you have any difficulty performing the following activities:  ?Hearing? 0 0  ?Vision? 0 0  ?Difficulty  concentrating or making decisions? 0 0  ?Walking or climbing stairs? 1 0  ?Dressing or bathing? 0 0  ?Doing errands, shopping? 0 0  ?Preparing Food and eating ? N   ?Using the Toilet? N   ?In the past six months, have you accidently leaked urine? N   ?Do you have problems with loss of bowel control? N   ?Managing your Medications? N   ?Managing your Finances? N   ?Housekeeping or managing your Housekeeping? N   ? ? ?Patient Care Team: ?Delsa Grana, PA-C as PCP - General (Family Medicine) ?Neldon Labella, RN as Case Manager ? ?Indicate any recent Medical Services you may have received from other than [facility] providers in the past year (date may be approximate). ? ?   ?Assessment:  ? This is a routine wellness examination for Anaiyah. ? ?Hearing/Vision screen ?Hearing Screening - Comments:: Pt denies hearing difficulty ?Vision Screening - Comments:: Annual vision screenings done at [facility]  ? ?Dietary issues and exercise activities discussed: ?Current Exercise Habits: The patient does not participate in regular exercise at present,  Exercise limited by: orthopedic condition(s) ? ? Goals Addressed   ?None ?  ? ?Depression Screen ? ?  02/02/2022  ?  3:42 PM 07/29/2021  ?  3:59 PM 05/22/2021  ?  2:36 PM 01/29/2021  ?  3:44 PM 09/08/2020  ?  1:16 PM 05/08/2020  ?  1

## 2022-02-06 ENCOUNTER — Encounter: Payer: Self-pay | Admitting: Endocrinology

## 2022-02-09 DIAGNOSIS — M25562 Pain in left knee: Secondary | ICD-10-CM | POA: Diagnosis not present

## 2022-02-09 DIAGNOSIS — M1712 Unilateral primary osteoarthritis, left knee: Secondary | ICD-10-CM | POA: Diagnosis not present

## 2022-02-15 ENCOUNTER — Other Ambulatory Visit: Payer: Self-pay | Admitting: Endocrinology

## 2022-02-17 DIAGNOSIS — M25562 Pain in left knee: Secondary | ICD-10-CM | POA: Diagnosis not present

## 2022-02-23 DIAGNOSIS — S83207A Unspecified tear of unspecified meniscus, current injury, left knee, initial encounter: Secondary | ICD-10-CM | POA: Diagnosis not present

## 2022-02-23 DIAGNOSIS — M1712 Unilateral primary osteoarthritis, left knee: Secondary | ICD-10-CM | POA: Diagnosis not present

## 2022-02-26 DIAGNOSIS — M1712 Unilateral primary osteoarthritis, left knee: Secondary | ICD-10-CM | POA: Diagnosis not present

## 2022-03-02 DIAGNOSIS — M25562 Pain in left knee: Secondary | ICD-10-CM | POA: Diagnosis not present

## 2022-03-13 DIAGNOSIS — M1712 Unilateral primary osteoarthritis, left knee: Secondary | ICD-10-CM | POA: Diagnosis not present

## 2022-03-18 DIAGNOSIS — M25562 Pain in left knee: Secondary | ICD-10-CM | POA: Diagnosis not present

## 2022-03-18 DIAGNOSIS — G8918 Other acute postprocedural pain: Secondary | ICD-10-CM | POA: Diagnosis not present

## 2022-03-18 DIAGNOSIS — K219 Gastro-esophageal reflux disease without esophagitis: Secondary | ICD-10-CM | POA: Diagnosis not present

## 2022-03-18 DIAGNOSIS — E785 Hyperlipidemia, unspecified: Secondary | ICD-10-CM | POA: Diagnosis not present

## 2022-03-18 DIAGNOSIS — E213 Hyperparathyroidism, unspecified: Secondary | ICD-10-CM | POA: Diagnosis not present

## 2022-03-18 DIAGNOSIS — M1712 Unilateral primary osteoarthritis, left knee: Secondary | ICD-10-CM | POA: Diagnosis not present

## 2022-03-18 HISTORY — PX: JOINT REPLACEMENT: SHX530

## 2022-03-19 DIAGNOSIS — E213 Hyperparathyroidism, unspecified: Secondary | ICD-10-CM | POA: Diagnosis not present

## 2022-03-19 DIAGNOSIS — M1712 Unilateral primary osteoarthritis, left knee: Secondary | ICD-10-CM | POA: Diagnosis not present

## 2022-03-19 DIAGNOSIS — K219 Gastro-esophageal reflux disease without esophagitis: Secondary | ICD-10-CM | POA: Diagnosis not present

## 2022-03-19 DIAGNOSIS — E785 Hyperlipidemia, unspecified: Secondary | ICD-10-CM | POA: Diagnosis not present

## 2022-03-25 DIAGNOSIS — M25562 Pain in left knee: Secondary | ICD-10-CM | POA: Diagnosis not present

## 2022-04-02 DIAGNOSIS — M25562 Pain in left knee: Secondary | ICD-10-CM | POA: Diagnosis not present

## 2022-04-06 DIAGNOSIS — M25562 Pain in left knee: Secondary | ICD-10-CM | POA: Diagnosis not present

## 2022-04-16 DIAGNOSIS — M25562 Pain in left knee: Secondary | ICD-10-CM | POA: Diagnosis not present

## 2022-04-20 DIAGNOSIS — M25562 Pain in left knee: Secondary | ICD-10-CM | POA: Diagnosis not present

## 2022-04-22 DIAGNOSIS — M25562 Pain in left knee: Secondary | ICD-10-CM | POA: Diagnosis not present

## 2022-04-28 DIAGNOSIS — M25562 Pain in left knee: Secondary | ICD-10-CM | POA: Diagnosis not present

## 2022-04-28 DIAGNOSIS — M25662 Stiffness of left knee, not elsewhere classified: Secondary | ICD-10-CM | POA: Diagnosis not present

## 2022-04-30 DIAGNOSIS — M25662 Stiffness of left knee, not elsewhere classified: Secondary | ICD-10-CM | POA: Diagnosis not present

## 2022-04-30 DIAGNOSIS — M25562 Pain in left knee: Secondary | ICD-10-CM | POA: Diagnosis not present

## 2022-04-30 DIAGNOSIS — Z96652 Presence of left artificial knee joint: Secondary | ICD-10-CM | POA: Diagnosis not present

## 2022-05-05 DIAGNOSIS — M25662 Stiffness of left knee, not elsewhere classified: Secondary | ICD-10-CM | POA: Diagnosis not present

## 2022-05-05 DIAGNOSIS — M25562 Pain in left knee: Secondary | ICD-10-CM | POA: Diagnosis not present

## 2022-05-11 DIAGNOSIS — M25662 Stiffness of left knee, not elsewhere classified: Secondary | ICD-10-CM | POA: Diagnosis not present

## 2022-05-11 DIAGNOSIS — M25562 Pain in left knee: Secondary | ICD-10-CM | POA: Diagnosis not present

## 2022-05-14 DIAGNOSIS — M25562 Pain in left knee: Secondary | ICD-10-CM | POA: Diagnosis not present

## 2022-05-14 DIAGNOSIS — M25662 Stiffness of left knee, not elsewhere classified: Secondary | ICD-10-CM | POA: Diagnosis not present

## 2022-05-18 DIAGNOSIS — M25662 Stiffness of left knee, not elsewhere classified: Secondary | ICD-10-CM | POA: Diagnosis not present

## 2022-05-18 DIAGNOSIS — M25562 Pain in left knee: Secondary | ICD-10-CM | POA: Diagnosis not present

## 2022-05-21 DIAGNOSIS — M25562 Pain in left knee: Secondary | ICD-10-CM | POA: Diagnosis not present

## 2022-05-21 DIAGNOSIS — M25662 Stiffness of left knee, not elsewhere classified: Secondary | ICD-10-CM | POA: Diagnosis not present

## 2022-05-25 ENCOUNTER — Ambulatory Visit: Payer: Medicare HMO | Admitting: Family Medicine

## 2022-05-26 ENCOUNTER — Ambulatory Visit: Payer: Medicare HMO | Admitting: Family Medicine

## 2022-05-28 DIAGNOSIS — M25662 Stiffness of left knee, not elsewhere classified: Secondary | ICD-10-CM | POA: Diagnosis not present

## 2022-05-28 DIAGNOSIS — M25562 Pain in left knee: Secondary | ICD-10-CM | POA: Diagnosis not present

## 2022-06-02 ENCOUNTER — Ambulatory Visit: Payer: Medicare HMO | Admitting: Family Medicine

## 2022-06-07 ENCOUNTER — Ambulatory Visit: Payer: Self-pay

## 2022-06-07 NOTE — Chronic Care Management (AMB) (Unsigned)
   06/07/2022  PAHOLA DIMMITT 01/14/1956 [id number]  Documentation encounter created to complete case transition. The care management team will continue to follow for care coordination.   [place] [facility] [phone]

## 2022-06-10 ENCOUNTER — Ambulatory Visit (INDEPENDENT_AMBULATORY_CARE_PROVIDER_SITE_OTHER): Payer: Medicare HMO | Admitting: Family Medicine

## 2022-06-10 ENCOUNTER — Encounter: Payer: Self-pay | Admitting: Family Medicine

## 2022-06-10 VITALS — BP 132/74 | HR 94 | Temp 98.2°F | Resp 16 | Ht 63.0 in | Wt 207.5 lb

## 2022-06-10 DIAGNOSIS — E785 Hyperlipidemia, unspecified: Secondary | ICD-10-CM | POA: Diagnosis not present

## 2022-06-10 DIAGNOSIS — E559 Vitamin D deficiency, unspecified: Secondary | ICD-10-CM | POA: Diagnosis not present

## 2022-06-10 DIAGNOSIS — Z6837 Body mass index (BMI) 37.0-37.9, adult: Secondary | ICD-10-CM | POA: Diagnosis not present

## 2022-06-10 DIAGNOSIS — K219 Gastro-esophageal reflux disease without esophagitis: Secondary | ICD-10-CM | POA: Diagnosis not present

## 2022-06-10 DIAGNOSIS — Z5181 Encounter for therapeutic drug level monitoring: Secondary | ICD-10-CM | POA: Diagnosis not present

## 2022-06-10 DIAGNOSIS — M810 Age-related osteoporosis without current pathological fracture: Secondary | ICD-10-CM | POA: Diagnosis not present

## 2022-06-10 DIAGNOSIS — E213 Hyperparathyroidism, unspecified: Secondary | ICD-10-CM | POA: Diagnosis not present

## 2022-06-10 MED ORDER — ROSUVASTATIN CALCIUM 10 MG PO TABS: 10.0000 mg | ORAL_TABLET | Freq: Every day | ORAL | 3 refills | Status: DC

## 2022-06-10 NOTE — Progress Notes (Signed)
Name: Danielle Le   MRN: [id number]    DOB: 06/06/1956   Date:06/10/2022       Progress Note  Chief Complaint  Patient presents with   Follow-up   Hyperlipidemia   Gastroesophageal Reflux     Subjective:   Danielle Le is a 66 y.o. female, presents to clinic for routine f/up  HLD on statin No SE or concerns in the past  GERD  on prilosec - sx well controlled  On boniva - tolerating, needs refill, is on vit d and calcium supplement  Hyperparathyroid - due for f/up on labs    Current Outpatient Medications:    aspirin EC 81 MG tablet, Take 81 mg by mouth daily., Disp: , Rfl:    Cholecalciferol (VITAMIN D) 50 MCG (2000 UT) CAPS, Take 1 capsule by mouth daily., Disp: , Rfl:    ibandronate (BONIVA) 150 MG tablet, TAKE 1 TABLET (150 MG TOTAL) BY MOUTH EVERY 30 (THIRTY) DAYS. TAKE IN THE MORNING WITH A FULL GLASS OF WATER, ON AN EMPTY STOMACH, AND DO NOT TAKE ANYTHING ELSE BY MOUTH OR LIE DOWN FOR THE NEXT 30 MIN., Disp: 3 tablet, Rfl: 3   meloxicam (MOBIC) 15 MG tablet, Take 15 mg by mouth daily., Disp: , Rfl:    omeprazole (PRILOSEC) 10 MG capsule, Take 10 mg by mouth daily., Disp: , Rfl:    rosuvastatin (CRESTOR) 10 MG tablet, Take 1 tablet (10 mg total) by mouth daily., Disp: 90 tablet, Rfl: 3   zinc gluconate 50 MG tablet, Take 50 mg by mouth daily., Disp: , Rfl:   Patient Active Problem List   Diagnosis Date Noted   Osteoporosis without current pathological fracture 05/22/2021   Class 2 severe obesity with serious comorbidity and body mass index (BMI) of 37.0 to 37.9 in adult [facility]) 05/22/2021   Hyperparathyroidism ([place]) 07/22/2020   Hypercalcemia 02/06/2020   Vitamin D deficiency 02/06/2020   Hypertriglyceridemia 02/06/2020   Cardiac murmur 01/05/2019   Iron deficiency anemia 01/05/2019   Hypercholesteremia 01/05/2019   Obesity (BMI 35.0-39.9 without comorbidity) 12/18/2018   Family history of high cholesterol 12/18/2018   Family history of dementia 12/18/2018     Past Surgical History:  Procedure Laterality Date   ABDOMINAL HYSTERECTOMY     CATARACT EXTRACTION W/PHACO Left 07/04/2019   Procedure: CATARACT EXTRACTION PHACO AND INTRAOCULAR LENS PLACEMENT ([place]) left  00:43.2  12.4%  5.40;  Surgeon: Leandrew Koyanagi, MD;  Location: [place];  Service: Ophthalmology;  Laterality: Left;   CATARACT EXTRACTION W/PHACO Right 07/25/2019   Procedure: CATARACT EXTRACTION PHACO AND INTRAOCULAR LENS PLACEMENT ([facility]) RIGHT  00:41.9  12.4%  5.18;  Surgeon: Leandrew Koyanagi, MD;  Location: [place];  Service: Ophthalmology;  Laterality: Right;   CESAREAN SECTION     COLONOSCOPY WITH PROPOFOL N/A 01/04/2019   Procedure: COLONOSCOPY WITH PROPOFOL;  Surgeon: Lin Landsman, MD;  Location[facility] ENDOSCOPY;  Service: Gastroenterology;  Laterality: N/A;   ESOPHAGOGASTRODUODENOSCOPY (EGD) WITH PROPOFOL N/A 01/04/2019   Procedure: ESOPHAGOGASTRODUODENOSCOPY (EGD) WITH PROPOFOL;  Surgeon: Lin Landsman, MD;  Location: [facility] ENDOSCOPY;  Service: Gastroenterology;  Laterality: N/A;    Family History  Problem Relation Age of Onset   Hypertension Mother    Hyperlipidemia Mother    Dementia Father    Hypertension Father    AVM Brother    Anxiety disorder Son    Seizures Son    Dementia Paternal Grandfather    Hypertension Brother    Hypercalcemia Neg Hx  Social History   Tobacco Use   Smoking status: Never   Smokeless tobacco: Never  Vaping Use   Vaping Use: Never used  Substance Use Topics   Alcohol use: Not Currently   Drug use: Not Currently     No Known Allergies  Health Maintenance  Topic Date Due   TETANUS/TDAP  Never done   Zoster Vaccines- Shingrix (1 of 2) Never done   Pneumonia Vaccine 61+ Years old (1 - PCV) Never done   COVID-19 Vaccine (4 - Moderna series) 11/03/2020   MAMMOGRAM  02/24/2022   INFLUENZA VACCINE  06/01/2022   DEXA SCAN  02/25/2023   COLONOSCOPY (Pts 45-49yr Insurance coverage will need  to be confirmed)  01/03/2026   Hepatitis C Screening  Completed   HPV VACCINES  Aged Out    Chart Review Today: I personally reviewed active problem list, medication list, allergies, family history, social history, health maintenance, notes from last encounter, lab results, imaging with the patient/caregiver today.   Review of Systems  Constitutional: Negative.   HENT: Negative.    Eyes: Negative.   Respiratory: Negative.    Cardiovascular: Negative.   Gastrointestinal: Negative.   Endocrine: Negative.   Genitourinary: Negative.   Musculoskeletal: Negative.   Skin: Negative.   Allergic/Immunologic: Negative.   Neurological: Negative.   Hematological: Negative.   Psychiatric/Behavioral: Negative.    All other systems reviewed and are negative.    Objective:   Vitals:   06/10/22 1352 06/10/22 1402  BP: (!) 140/72 132/74  Pulse: 94   Resp: 16   Temp: 98.2 F (36.8 C)   TempSrc: Oral   SpO2: 98%   Weight: 207 lb 8 oz (94.1 kg)   Height: '5\' 3"'$  (1.6 m)     Body mass index is 36.76 kg/m.  Physical Exam Vitals and nursing note reviewed.  Constitutional:      General: She is not in acute distress.    Appearance: Normal appearance. She is well-developed. She is obese. She is not ill-appearing, toxic-appearing or diaphoretic.     Interventions: Face mask in place.  HENT:     Head: Normocephalic and atraumatic.     Right Ear: External ear normal.     Left Ear: External ear normal.  Eyes:     General: Lids are normal. No scleral icterus.       Right eye: No discharge.        Left eye: No discharge.     Conjunctiva/sclera: Conjunctivae normal.  Neck:     Trachea: Phonation normal. No tracheal deviation.  Cardiovascular:     Rate and Rhythm: Normal rate and regular rhythm.     Pulses: Normal pulses.          Radial pulses are 2+ on the right side and 2+ on the left side.       Posterior tibial pulses are 2+ on the right side and 2+ on the left side.     Heart  sounds: Normal heart sounds. No murmur heard.    No friction rub. No gallop.  Pulmonary:     Effort: Pulmonary effort is normal. No respiratory distress.     Breath sounds: Normal breath sounds. No stridor. No wheezing, rhonchi or rales.  Chest:     Chest wall: No tenderness.  Abdominal:     Palpations: Abdomen is soft.  Musculoskeletal:     Right lower leg: No edema.     Left lower leg: No edema.  Skin:  General: Skin is warm and dry.     Coloration: Skin is not jaundiced or pale.  Neurological:     Mental Status: She is alert.     Motor: No abnormal muscle tone.     Gait: Gait normal.  Psychiatric:        Attention and Perception: Attention normal.        Mood and Affect: Mood and affect normal.        Speech: Speech normal.        Behavior: Behavior normal.         Assessment & Plan:   Problem List Items Addressed This Visit       Digestive   Gastroesophageal reflux disease    Sx well controlled on PPI, encouraged weaning off when able, reviewed long term PPI SE Can use pepcid, avoid diet triggers, lifestyle changes         Endocrine   Hyperparathyroidism ([place])    Recheck labs - f/up with endo as needed      Relevant Orders   DG Bone Density   VITAMIN D 25 Hydroxy (Vit-D Deficiency, Fractures) (Completed)   Parathyroid hormone, intact (no Ca) (Completed)     Musculoskeletal and Integument   Osteoporosis without current pathological fracture    Dr. Loanne Drilling retired, due for dexa - can verify his order, reorder On boniva and vit d supplements      Relevant Orders   DG Bone Density   VITAMIN D 25 Hydroxy (Vit-D Deficiency, Fractures) (Completed)   Parathyroid hormone, intact (no Ca) (Completed)     Other   Hyperlipidemia - Primary   Relevant Medications   rosuvastatin (CRESTOR) 10 MG tablet   Other Relevant Orders   COMPLETE METABOLIC PANEL WITH GFR (Completed)   Vitamin D deficiency    On OTC supplement, with associated osteoporosis - labs to  monitor      Relevant Orders   DG Bone Density   VITAMIN D 25 Hydroxy (Vit-D Deficiency, Fractures) (Completed)   Parathyroid hormone, intact (no Ca) (Completed)   Class 2 severe obesity with serious comorbidity and body mass index (BMI) of 37.0 to 37.9 in adult (HCC)    Wt Readings from Last 5 Encounters:  06/10/22 207 lb 8 oz (94.1 kg)  01/20/22 215 lb 9.6 oz (97.8 kg)  07/23/21 210 lb 6.4 oz (95.4 kg)  05/22/21 210 lb 4.8 oz (95.4 kg)  01/29/21 217 lb 14.4 oz (98.8 kg)   BMI Readings from Last 5 Encounters:  06/10/22 36.76 kg/m  01/20/22 38.19 kg/m  07/23/21 37.27 kg/m  05/22/21 37.25 kg/m  01/29/21 38.60 kg/m  Weight has decreased some, continue diet/lifestyle efforts Associated osteoporosis, HLD      Other Visit Diagnoses     Medication monitoring encounter       Relevant Orders   COMPLETE METABOLIC PANEL WITH GFR (Completed)   DG Bone Density        Return for march f/up ov for labs or CPE per pt preference .   Delsa Grana, PA-C 06/10/22 2:32 PM

## 2022-06-11 LAB — COMPLETE METABOLIC PANEL WITH GFR
AG Ratio: 2 (calc) (ref 1.0–2.5)
ALT: 19 U/L (ref 6–29)
AST: 27 U/L (ref 10–35)
Albumin: 4.4 g/dL (ref 3.6–5.1)
Alkaline phosphatase (APISO): 84 U/L (ref 37–153)
BUN: 12 mg/dL (ref 7–25)
CO2: 29 mmol/L (ref 20–32)
Calcium: 11.4 mg/dL — ABNORMAL HIGH (ref 8.6–10.4)
Chloride: 105 mmol/L (ref 98–110)
Creat: 0.62 mg/dL (ref 0.50–1.05)
Globulin: 2.2 g/dL (calc) (ref 1.9–3.7)
Glucose, Bld: 92 mg/dL (ref 65–99)
Potassium: 4.3 mmol/L (ref 3.5–5.3)
Sodium: 140 mmol/L (ref 135–146)
Total Bilirubin: 0.4 mg/dL (ref 0.2–1.2)
Total Protein: 6.6 g/dL (ref 6.1–8.1)
eGFR: 97 mL/min/{1.73_m2} (ref >=60)

## 2022-06-11 LAB — PARATHYROID HORMONE, INTACT (NO CA): PTH: 136 pg/mL — ABNORMAL HIGH (ref 16–77)

## 2022-06-11 LAB — VITAMIN D 25 HYDROXY (VIT D DEFICIENCY, FRACTURES): Vit D, 25-Hydroxy: 46 ng/mL (ref 30–100)

## 2022-06-17 ENCOUNTER — Encounter: Payer: Self-pay | Admitting: Family Medicine

## 2022-06-17 DIAGNOSIS — K219 Gastro-esophageal reflux disease without esophagitis: Secondary | ICD-10-CM | POA: Insufficient documentation

## 2022-06-17 NOTE — Assessment & Plan Note (Signed)
Wt Readings from Last 5 Encounters:  06/10/22 207 lb 8 oz (94.1 kg)  01/20/22 215 lb 9.6 oz (97.8 kg)  07/23/21 210 lb 6.4 oz (95.4 kg)  05/22/21 210 lb 4.8 oz (95.4 kg)  01/29/21 217 lb 14.4 oz (98.8 kg)   BMI Readings from Last 5 Encounters:  06/10/22 36.76 kg/m  01/20/22 38.19 kg/m  07/23/21 37.27 kg/m  05/22/21 37.25 kg/m  01/29/21 38.60 kg/m   Weight has decreased some, continue diet/lifestyle efforts Associated osteoporosis, HLD

## 2022-06-17 NOTE — Assessment & Plan Note (Signed)
Recheck labs - f/up with endo as needed

## 2022-06-17 NOTE — Assessment & Plan Note (Signed)
Sx well controlled on PPI, encouraged weaning off when able, reviewed long term PPI SE Can use pepcid, avoid diet triggers, lifestyle changes

## 2022-06-17 NOTE — Assessment & Plan Note (Signed)
Dr. Loanne Drilling retired, due for dexa - can verify his order, reorder On boniva and vit d supplements

## 2022-06-17 NOTE — Assessment & Plan Note (Signed)
Pt did start statin, doing well on crestor, no SE or concerns Last lipids reviewed and showed a good improvement, continue crestor - refills sent in Continue other heart healthy habits and lifestyle efforts

## 2022-06-17 NOTE — Assessment & Plan Note (Signed)
On OTC supplement, with associated osteoporosis - labs to monitor

## 2022-06-29 DIAGNOSIS — M25662 Stiffness of left knee, not elsewhere classified: Secondary | ICD-10-CM | POA: Diagnosis not present

## 2022-06-29 DIAGNOSIS — M25562 Pain in left knee: Secondary | ICD-10-CM | POA: Diagnosis not present

## 2022-07-14 ENCOUNTER — Ambulatory Visit
Admission: RE | Admit: 2022-07-14 | Discharge: 2022-07-14 | Disposition: A | Discharge status: Home / Self Care | Payer: Medicare HMO | Source: Ambulatory Visit | Attending: Family Medicine | Admitting: Family Medicine

## 2022-07-14 DIAGNOSIS — Z1231 Encounter for screening mammogram for malignant neoplasm of breast: Secondary | ICD-10-CM | POA: Insufficient documentation

## 2022-07-21 DIAGNOSIS — M25662 Stiffness of left knee, not elsewhere classified: Secondary | ICD-10-CM | POA: Diagnosis not present

## 2022-07-21 DIAGNOSIS — M25562 Pain in left knee: Secondary | ICD-10-CM | POA: Diagnosis not present

## 2022-09-03 ENCOUNTER — Ambulatory Visit: Payer: Medicare HMO | Admitting: Internal Medicine

## 2022-09-03 ENCOUNTER — Encounter: Payer: Self-pay | Admitting: Internal Medicine

## 2022-09-03 VITALS — BP 130/82 | HR 92 | Ht 63.0 in | Wt 206.0 lb

## 2022-09-03 DIAGNOSIS — E559 Vitamin D deficiency, unspecified: Secondary | ICD-10-CM | POA: Diagnosis not present

## 2022-09-03 DIAGNOSIS — E213 Hyperparathyroidism, unspecified: Secondary | ICD-10-CM

## 2022-09-03 DIAGNOSIS — M810 Age-related osteoporosis without current pathological fracture: Secondary | ICD-10-CM | POA: Diagnosis not present

## 2022-09-03 NOTE — Progress Notes (Addendum)
Patient ID: Danielle Le, female   DOB: [birth year], 66 y.o.   MRN: [id number]  HPI  Danielle Le is a 66 y.o.-year-old female, returning for follow-up for hypercalcemia/hyperparathyroidism.  She previously saw Dr. Loanne Drilling, last visit 7 months ago.  At last visit with Dr. Loanne Drilling, he suggested parathyroidectomy.  He referred patient to surgery.  She did not have this yet as she was not called to schedule the surgery appt.  She also has problems arranging care for her son who is not mobile and she also had knee surgery since last OV.  Reviewed and addended history: Pt was dx with hypercalcemia in ~2020.   I reviewed pt's pertinent labs: Lab Results  Component Value Date   PTH 136 (H) 06/10/2022   PTH 208 (H) 01/13/2022   PTH 98 (H) 07/23/2021   PTH 81 (H) 01/19/2021   PTH 129 (H) 07/22/2020   PTH 106 (H) 04/30/2020   CALCIUM 11.4 (H) 06/10/2022   CALCIUM 11.8 (H) 01/13/2022   CALCIUM 11.0 (H) 07/23/2021   CALCIUM 11.2 (H) 07/23/2021   CALCIUM 11.4 (H) 01/19/2021   CALCIUM 10.8 (H) 10/02/2020   CALCIUM 10.9 (H) 07/22/2020   CALCIUM 11.3 (H) 04/30/2020   CALCIUM 10.9 (H) 02/05/2020   CALCIUM 10.5 (H) 01/15/2020   Other pertinent investigation was reviewed -normal SPEP, vitamin A, phosphorus, PTH RP, but elevated calcitriol: Component     Latest Ref Rng 07/22/2020  Total Protein     6.1 - 8.1 g/dL 7.3   Albumin ELP     3.8 - 4.8 g/dL 4.3   Alpha 1     0.2 - 0.3 g/dL 0.3   Alpha 2     0.5 - 0.9 g/dL 0.7   Beta Globulin     0.4 - 0.6 g/dL 0.5   Beta 2     0.2 - 0.5 g/dL 0.4   Gamma Globulin     0.8 - 1.7 g/dL 1.1   SPE Interp. --   Vitamin D 1, 25 (OH) Total     18 - 72 pg/mL 74 (H)   Vitamin D3 1, 25 (OH)     pg/mL 54   Vitamin D2 1, 25 (OH)     pg/mL 20   Vitamin A (Retinoic Acid)     38 - 98 mcg/dL 43   PTH-Related Protein (PTH-RP)     11 - 20 pg/mL 13   Phosphorus     2.3 - 4.6 mg/dL 3.1     She has a history of osteoporosis: Date L1-L4 T score FN T score 33%  distal Radius Ultra distal radius  02/24/2021 ([place] [facility] ) N/a RFN: -2.3 LFN: -2.6 -2.2 -3.2   She was started on Boniva 01/2021 - tolerated well.  She has generalized aches and pains, but not developed after she started Boniva.  Of note, she also has a history of GERD and is on omeprazole.  No fractures or falls.   No h/o kidney stones.  No h/o CKD. Last BUN/Cr: Lab Results  Component Value Date   BUN 12 06/10/2022   BUN 14 01/13/2022   CREATININE 0.62 06/10/2022   CREATININE 0.68 01/13/2022   Pt is not on HCTZ.  She has a h/o vitamin D deficiency. Reviewed vit D levels: Lab Results  Component Value Date   VD25OH 46 06/10/2022   VD25OH 43 01/13/2022   VD25OH 50.94 07/23/2021   VD25OH 52.28 01/19/2021   VD25OH 24.92 (L) 07/22/2020  VD25OH 40 04/30/2020   VD25OH 18 (L) 02/05/2020  On vitamin D 5000 units daily  Pt is not on calcium.  Pt does not have a FH of hypercalcemia, pituitary tumors, thyroid cancer, or osteoporosis.   Pt. also has a history of cataract, anemia.   ROS: + see HPI She has vertigo. She stopped Meloxicam.  Past Medical History:  Diagnosis Date   Anemia    Cataract    Past Surgical History:  Procedure Laterality Date   ABDOMINAL HYSTERECTOMY     CATARACT EXTRACTION W/PHACO Left 07/04/2019   Procedure: CATARACT EXTRACTION PHACO AND INTRAOCULAR LENS PLACEMENT ([facility]) left  00:43.2  12.4%  5.40;  Surgeon: Leandrew Koyanagi, MD;  Location: [place];  Service: Ophthalmology;  Laterality: Left;   CATARACT EXTRACTION W/PHACO Right 07/25/2019   Procedure: CATARACT EXTRACTION PHACO AND INTRAOCULAR LENS PLACEMENT ([facility]) RIGHT  00:41.9  12.4%  5.18;  Surgeon: Leandrew Koyanagi, MD;  Location: [place];  Service: Ophthalmology;  Laterality: Right;   CESAREAN SECTION     COLONOSCOPY WITH PROPOFOL N/A 01/04/2019   Procedure: COLONOSCOPY WITH PROPOFOL;  Surgeon: Lin Landsman, MD;  Location: [facility]  ENDOSCOPY;  Service: Gastroenterology;  Laterality: N/A;   ESOPHAGOGASTRODUODENOSCOPY (EGD) WITH PROPOFOL N/A 01/04/2019   Procedure: ESOPHAGOGASTRODUODENOSCOPY (EGD) WITH PROPOFOL;  Surgeon: Lin Landsman, MD;  Location: [facility] ENDOSCOPY;  Service: Gastroenterology;  Laterality: N/A;   Social History   Socioeconomic History   Marital status: Divorced    Spouse name: Not on file   Number of children: 1   Years of education: 12   Highest education level: Associate degree: occupational, Hotel manager, or vocational program  Occupational History   Occupation: stay home mom  Tobacco Use   Smoking status: Never   Smokeless tobacco: Never  Vaping Use   Vaping Use: Never used  Substance and Sexual Activity   Alcohol use: Not Currently   Drug use: Not Currently   Sexual activity: Not Currently  Other Topics Concern   Not on file  Social History Narrative   Pt has 68 year old special needs son that lives at home who is non ambulatory/non verbal and she is primary caregiver.    Social Determinants of Health   Financial Resource Strain: Low Risk  (02/02/2022)   Overall Financial Resource Strain (CARDIA)    Difficulty of Paying Living Expenses: Not very hard  Food Insecurity: No Food Insecurity (02/02/2022)   Hunger Vital Sign    Worried About Running Out of Food in the Last Year: Never true    Ran Out of Food in the Last Year: Never true  Transportation Needs: No Transportation Needs (02/02/2022)   PRAPARE - Hydrologist (Medical): No    Lack of Transportation (Non-Medical): No  Physical Activity: Inactive (02/02/2022)   Exercise Vital Sign    Days of Exercise per Week: 0 days    Minutes of Exercise per Session: 0 min  Stress: No Stress Concern Present (02/02/2022)   [place]    Feeling of Stress : Not at all  Social Connections: Socially Isolated (02/02/2022)   Social Connection and Isolation  Panel [NHANES]    Frequency of Communication with Friends and Family: More than three times a week    Frequency of Social Gatherings with Friends and Family: Twice a week    Attends Religious Services: Never    Marine scientist or Organizations: No    Attends  Club or Organization Meetings: Never    Marital Status: Divorced  Human resources officer Violence: Not At Risk (02/02/2022)   Humiliation, Afraid, Rape, and Kick questionnaire    Fear of Current or Ex-Partner: No    Emotionally Abused: No    Physically Abused: No    Sexually Abused: No   Current Outpatient Medications on File Prior to Visit  Medication Sig Dispense Refill   aspirin EC 81 MG tablet Take 81 mg by mouth daily.     Cholecalciferol (VITAMIN D) 50 MCG (2000 UT) CAPS Take 1 capsule by mouth daily.     ibandronate (BONIVA) 150 MG tablet TAKE 1 TABLET (150 MG TOTAL) BY MOUTH EVERY 30 (THIRTY) DAYS. TAKE IN THE MORNING WITH A FULL GLASS OF WATER, ON AN EMPTY STOMACH, AND DO NOT TAKE ANYTHING ELSE BY MOUTH OR LIE DOWN FOR THE NEXT 30 MIN. 3 tablet 3   meloxicam (MOBIC) 15 MG tablet Take 15 mg by mouth daily.     omeprazole (PRILOSEC) 10 MG capsule Take 10 mg by mouth daily.     rosuvastatin (CRESTOR) 10 MG tablet Take 1 tablet (10 mg total) by mouth daily. 90 tablet 3   zinc gluconate 50 MG tablet Take 50 mg by mouth daily.     No current facility-administered medications on file prior to visit.   No Known Allergies Family History  Problem Relation Age of Onset   Hypertension Mother    Hyperlipidemia Mother    Dementia Father    Hypertension Father    AVM Brother    Anxiety disorder Son    Seizures Son    Dementia Paternal Grandfather    Hypertension Brother    Hypercalcemia Neg Hx    PE: BP 130/82 (BP Location: Left Arm, Patient Position: Sitting, Cuff Size: Normal)   Pulse 92   Ht '5\' 3"'$  (1.6 m)   Wt 206 lb (93.4 kg)   SpO2 98%   BMI 36.49 kg/m  Wt Readings from Last 3 Encounters:  09/03/22 206 lb (93.4 kg)   06/10/22 207 lb 8 oz (94.1 kg)  01/20/22 215 lb 9.6 oz (97.8 kg)   Constitutional: overweight, in NAD Eyes:  EOMI, no exophthalmos ENT: no neck masses, no cervical lymphadenopathy Cardiovascular: RRR, No MRG Respiratory: CTA B Musculoskeletal: no deformities Skin:no rashes Neurological: no tremor with outstretched hands  Assessment: 1. Primary hyperparathyroidism  2.  Vitamin D deficiency  3.  Osteoporosis  Plan: Patient has had several instances of elevated calcium, with the highest level being at 11.8. A corresponding intact PTH level was also high, at 208.  Latest calcium was 11.4,but the albumin-corrected calcium was 11.1. - + apparent complications from hypercalcemia: no h/o nephrolithiasis, but she does have osteoporosis. No fractures. No abdominal pain, depression, bone pain. - I discussed with the patient about the physiology of calcium and parathyroid hormone, and possible side effects from increased PTH, including kidney stones, osteoporosis, abdominal pain, etc.  - We discussed that we need to check whether her hyperparathyroidism is primary (Familial hypercalcemic hypocalciuria or parathyroid adenoma) or secondary (to conditions like: vitamin D deficiency, calcium malabsorption, hypercalciuria, renal insufficiency, etc.). -Usually during investigation for hyperparathyroidism, we need to make sure that her vitamin D level is normal to further investigate the parathyroid status. In the setting of a low vitamin D, the parathyroid hormone can be elevated, which is not a pathologic finding. However, if the PTH is elevated in the setting of a normal vitamin D, we will further need  to investigate her for primary or secondary hyperparathyroidism.  She does have a history of vitamin D deficiency, however, at the time of her highest PTH/calcium levels, her vitamin D level was normal.  We will repeat this now. -She previously had investigation for hypercalcemia to include a normal vitamin  A, PTH RP, and SPEP.  As expected in the setting of primary hyperparathyroidism, the calcitriol level was high. -For now, I will suggest to check:  PTH ([facility] assay)  Ionized calcium Magnesium vitamin D- 25 HO  24h urinary calcium/creatinine ratio - given instructions for urine collection - We discussed possible consequences of hyperparathyroidism: ~1/3 pts will develop complications over 15 years (OP, nephrolithiasis).  - If the tests indicate a parathyroid adenoma, she agrees with a referral to surgery.  - Criteria for parathyroid surgery are:  Increased calcium by more than 1 mg/dL above the upper limit of normal  Kidney ds.  Osteoporosis (or vertebral fracture) Age <78 years old Newer criteria (2013): High UCa >400 mg/d and increased stone risk by biochemical stone risk analysis Presence of nephrolithiasis or nephrocalcinosis Pt's preference!  - reviewed her previous DXA scan and I explained that the 33% distal radius reflects cortical bone quality, which is mostly affected by hyperparathyroidism.  This score was low for her. - I will advise her about vitamin D supplement dose when the results of the vitamin D level are back. - I will see the patient back in 4 months  2.  Vitamin D deficiency -She is on 5000 units vitamin D daily -Latest vitamin D level was normal and we will repeat this today  3.  Osteoporosis -Reviewed previous bone density scan from last year.  She is due for another bone density scan next year. -She continues on ibandronate, which she tolerates well, despite a history of GERD -Ibadronate was not shown to improve fracture risk at the hips, so after the next bone density, we may need to change this to alendronate. -I do expect her bone density to improve after her surgery  - Total time spent for the visit: 40 min, in precharting, reviewing Dr. Cordelia Pen last note, obtaining medical information from the chart and from the pt, reviewing her  previous labs,  evaluations, and treatments, reviewing her symptoms, counseling her about her endocrine problems (please see the discussed topics above), and developing a plan to further investigate and treat them; she had a number of questions which I addressed.  Component     Latest Ref Rng 09/03/2022  Vitamin D, 25-Hydroxy     30.0 - 100.0 ng/mL 54.9   PTH, Intact     15 - 65 pg/mL 152 (H)   Calcium Ionized     4.7 - 5.5 mg/dL 5.6 (H)   Magnesium     1.6 - 2.3 mg/dL 2.5 (H)    Magnesium level is slightly high.  I will check with her if she is taking a supplement. PTH remains elevated.  Ionized calcium also high.  Vitamin D level is normal We will proceed with a 24-hour urine collection for calcium.  Component     Latest Ref Rng 09/06/2022  Creatinine, 24H Ur     0.50 - 2.15 g/24 h 1.22   Calcium, 24H Urine     mg/24 h 142   Urinary calcium is not low. We added a BMP to the blood already in the lab to be able to calculate the fractional excretion of calcium (this is scanned under the media tab in Epic):  BUN/creatinine 11/0.63, calcium 10.8 (8.7-10.3) FECa 0.0067, which is lower than 0.01.  This is unusual, since I do not suspect [place] for her (magnesium level was also elevated, which can be seen in [facility]). I would like to repeat her Urinary calcium in ~4 weeks and will advise her to make sure she is not drinking water excessively during the collection.  Component     Latest Ref Rng 09/29/2022  Creatinine, 24H Ur     0.50 - 2.15 g/24 h 1.12   Calcium, 24H Urine     mg/24 h 652 (H)   Her urine calcium level is now elevated.  This is more consistent with her suspected hyperparathyroidism.  Unfortunately, a basic metabolic panel was not collected at the same time with a urine specimen.  Using the previous parameters, FECa is now 0.034.  We will go ahead and refer her to surgery.  Philemon Kingdom, MD PhD [facility] Endocrinology

## 2022-09-03 NOTE — Patient Instructions (Signed)
Please stop at the lab.  Patient information (Up-to-Date): Collection of a 24-hour urine specimen  - You should collect every drop of urine during each 24-hour period. It does not matter how much or little urine is passed each time, as long as every drop is collected. - Begin the urine collection in the morning after you wake up, after you have emptied your bladder for the first time. - Urinate (empty the bladder) for the first time and flush it down the toilet. Note the exact time (eg, 6:15 AM). You will begin the urine collection at this time. - Collect every drop of urine during the day and night in an empty collection bottle. Store the bottle at room temperature or in the refrigerator. - If you need to have a bowel movement, any urine passed with the bowel movement should be collected. Try not to include feces with the urine collection. If feces does get mixed in, do not try to remove the feces from the urine collection bottle. - Finish by collecting the first urine passed the next morning, adding it to the collection bottle. This should be within ten minutes before or after the time of the first morning void on the first day (which was flushed). In this example, you would try to void between 6:05 and 6:25 on the second day. - If you need to urinate one hour before the final collection time, drink a full glass of water so that you can void again at the appropriate time. If you have to urinate 20 minutes before, try to hold the urine until the proper time. - Please note the exact time of the final collection, even if it is not the same time as when collection began on day 1. - The bottle(s) may be kept at room temperature for a day or two, but should be kept cool or refrigerated for longer periods of time.  Please return in 4 months.

## 2022-09-05 LAB — CALCIUM, IONIZED: Calcium, Ion: 5.6 mg/dL — ABNORMAL HIGH (ref 4.7–5.5)

## 2022-09-06 ENCOUNTER — Other Ambulatory Visit (INDEPENDENT_AMBULATORY_CARE_PROVIDER_SITE_OTHER): Payer: Medicare HMO

## 2022-09-06 DIAGNOSIS — E213 Hyperparathyroidism, unspecified: Secondary | ICD-10-CM | POA: Diagnosis not present

## 2022-09-06 LAB — PARATHYROID HORMONE, INTACT (NO CA): PTH: 152 pg/mL — ABNORMAL HIGH (ref 15–65)

## 2022-09-06 LAB — VITAMIN D 25 HYDROXY (VIT D DEFICIENCY, FRACTURES): Vit D, 25-Hydroxy: 54.9 ng/mL (ref 30.0–100.0)

## 2022-09-06 LAB — MAGNESIUM: Magnesium: 2.5 mg/dL — ABNORMAL HIGH (ref 1.6–2.3)

## 2022-09-06 NOTE — Progress Notes (Unsigned)
Start date: 09-04-22 8am End date: 09-05-22 830am  Total volume= 1,350

## 2022-09-07 ENCOUNTER — Other Ambulatory Visit: Payer: Self-pay

## 2022-09-07 LAB — CALCIUM, URINE, 24 HOUR: Calcium, 24H Urine: 142 mg/24 h

## 2022-09-07 LAB — EXTRA URINE SPECIMEN

## 2022-09-07 LAB — CREATININE, URINE, 24 HOUR: Creatinine, 24H Ur: 1.22 g/(24.h) (ref 0.50–2.15)

## 2022-09-09 ENCOUNTER — Encounter: Payer: Self-pay | Admitting: Internal Medicine

## 2022-09-09 ENCOUNTER — Other Ambulatory Visit: Payer: Self-pay | Admitting: Internal Medicine

## 2022-09-10 ENCOUNTER — Encounter: Payer: Self-pay | Admitting: Internal Medicine

## 2022-09-13 ENCOUNTER — Other Ambulatory Visit: Payer: Self-pay

## 2022-09-13 LAB — BASIC METABOLIC PANEL
BUN/Creatinine Ratio: 17 (ref 12–28)
BUN: 11 mg/dL (ref 8–27)
CO2: 23 mmol/L (ref 20–29)
Calcium: 10.8 mg/dL — ABNORMAL HIGH (ref 8.7–10.3)
Chloride: 102 mmol/L (ref 96–106)
Creatinine, Ser: 0.63 mg/dL (ref 0.57–1.00)
Glucose: 93 mg/dL (ref 70–99)
Potassium: 4.7 mmol/L (ref 3.5–5.2)
Sodium: 138 mmol/L (ref 134–144)
eGFR: 97 mL/min/{1.73_m2} (ref >59)

## 2022-09-13 LAB — SPECIMEN STATUS REPORT

## 2022-09-29 ENCOUNTER — Other Ambulatory Visit: Payer: Medicare HMO

## 2022-09-29 DIAGNOSIS — E213 Hyperparathyroidism, unspecified: Secondary | ICD-10-CM

## 2022-09-29 NOTE — Progress Notes (Unsigned)
Total volume 1,400. Started 09-28-22 at 10am. Ebded 11-29 at 10am.

## 2022-09-30 ENCOUNTER — Encounter: Payer: Self-pay | Admitting: Internal Medicine

## 2022-09-30 LAB — CALCIUM, URINE, 24 HOUR: Calcium, 24H Urine: 652 mg/24 h — ABNORMAL HIGH

## 2022-09-30 LAB — CREATININE, URINE, 24 HOUR: Creatinine, 24H Ur: 1.12 g/(24.h) (ref 0.50–2.15)

## 2022-09-30 NOTE — Addendum Note (Signed)
Addended by: Philemon Kingdom on: 09/30/2022 12:09 PM   Modules accepted: Orders

## 2022-11-09 DIAGNOSIS — E210 Primary hyperparathyroidism: Secondary | ICD-10-CM | POA: Diagnosis not present

## 2022-11-09 DIAGNOSIS — E213 Hyperparathyroidism, unspecified: Secondary | ICD-10-CM | POA: Diagnosis not present

## 2022-11-16 ENCOUNTER — Other Ambulatory Visit: Payer: Self-pay | Admitting: Surgery

## 2022-11-16 DIAGNOSIS — E210 Primary hyperparathyroidism: Secondary | ICD-10-CM

## 2022-11-17 ENCOUNTER — Other Ambulatory Visit: Payer: Self-pay

## 2022-11-22 ENCOUNTER — Ambulatory Visit
Admission: RE | Admit: 2022-11-22 | Discharge: 2022-11-22 | Disposition: A | Discharge status: Home / Self Care | Payer: Medicare HMO | Source: Ambulatory Visit | Attending: Surgery | Admitting: Surgery

## 2022-11-22 ENCOUNTER — Encounter
Admission: RE | Admit: 2022-11-22 | Discharge: 2022-11-22 | Disposition: A | Discharge status: Home / Self Care | Payer: Medicare HMO | Source: Ambulatory Visit | Attending: Surgery | Admitting: Surgery

## 2022-11-22 DIAGNOSIS — E041 Nontoxic single thyroid nodule: Secondary | ICD-10-CM | POA: Diagnosis not present

## 2022-11-22 DIAGNOSIS — E210 Primary hyperparathyroidism: Secondary | ICD-10-CM | POA: Diagnosis not present

## 2022-11-22 MED ORDER — TECHNETIUM TC 99M SESTAMIBI GENERIC - CARDIOLITE
24.9200 | Freq: Once | INTRAVENOUS | Status: AC
  Administered 2022-11-22: 24.92 via INTRAVENOUS

## 2022-11-26 ENCOUNTER — Ambulatory Visit: Payer: Self-pay | Admitting: Surgery

## 2022-11-26 NOTE — Progress Notes (Signed)
Good news!  Both the USN and the sestamibi scan localize an adenoma to the right inferior position, making you a good candidate for minimally invasive outpatient surgery as we discussed in the office.  I will enter orders for surgery and send to my schedulers to contact you.  Armandina Gemma, MD [facility] [place] practice Office: [phone]

## 2022-12-02 DIAGNOSIS — H26492 Other secondary cataract, left eye: Secondary | ICD-10-CM | POA: Diagnosis not present

## 2022-12-02 DIAGNOSIS — Z0100 Encounter for examination of eyes and vision without abnormal findings: Secondary | ICD-10-CM | POA: Diagnosis not present

## 2022-12-02 DIAGNOSIS — H35342 Macular cyst, hole, or pseudohole, left eye: Secondary | ICD-10-CM | POA: Diagnosis not present

## 2022-12-02 DIAGNOSIS — Z961 Presence of intraocular lens: Secondary | ICD-10-CM | POA: Diagnosis not present

## 2022-12-02 DIAGNOSIS — H35371 Puckering of macula, right eye: Secondary | ICD-10-CM | POA: Diagnosis not present

## 2022-12-05 ENCOUNTER — Encounter: Payer: Self-pay | Admitting: Surgery

## 2022-12-05 NOTE — H&P (Signed)
REFERRING PHYSICIAN: Philemon Kingdom, MD  PROVIDER: Delrick Dehart Charlotta Newton, MD   Chief Complaint: New Consultation (NEW PATIENT - HYPERPARATHYROIDISM)  History of Present Illness:  Patient is referred to Dr. Philemon Kingdom for surgical evaluation and management of newly diagnosed primary hyperparathyroidism. Patient had been previously evaluated by Dr. Renato Shin. Patient has had longstanding hypercalcemia. Laboratory studies have shown calcium levels ranging as high as 11.8. Her most recent calcium was 10.8. Intact PTH level has been as high as 208 with her most recent level being 152. Patient underwent a 24-hour urine collection for calcium which was markedly elevated at 652. 25-hydroxy vitamin D level was normal at 54.9. Patient has not had any imaging studies. Patient is symptomatic. She has known osteoporosis based on a bone density scan. She complains of fatigue and bone and joint discomfort. She denies nephrolithiasis. She has had no prior head or neck surgery. There is a history of thyroid disease in multiple family members which appears to have been benign. There is no family history of parathyroid disease. Patient does care for an adult age son who requires total care. Patient presents today for further recommendations for evaluation and surgical management.  Review of Systems: A complete review of systems was obtained from the patient. I have reviewed this information and discussed as appropriate with the patient. See HPI as well for other ROS.  Review of Systems Constitutional: Positive for malaise/fatigue. HENT: Negative. Eyes: Negative. Respiratory: Negative. Cardiovascular: Negative. Gastrointestinal: Negative. Genitourinary: Negative. Musculoskeletal: Positive for joint pain. Osteoporosis Skin: Negative. Neurological: Negative. Endo/Heme/Allergies: Negative. Psychiatric/Behavioral: Negative.   Medical History: History reviewed. No pertinent past medical  history.  Patient Active Problem List Diagnosis Primary hyperparathyroidism (CMS-HCC)  History reviewed. No pertinent surgical history.  No Known Allergies  Current Outpatient Medications on File Prior to Visit Medication Sig Dispense Refill ibandronate (BONIVA) 150 mg tablet methocarbamoL (ROBAXIN) 500 MG tablet TAKE 1 TABLET ('500MG'$ ) BY MOUTH 3 TIMES A DAY FOR SPASM rosuvastatin (CRESTOR) 10 MG tablet aspirin 81 MG EC tablet Take 81 mg by mouth once daily cholecalciferol (VITAMIN D3) 2,000 unit capsule Take 1 capsule by mouth once daily  No current facility-administered medications on file prior to visit.  History reviewed. No pertinent family history.  Social History  Tobacco Use Smoking Status Never Smokeless Tobacco Never   Social History  Socioeconomic History Marital status: Divorced Tobacco Use Smoking status: Never Smokeless tobacco: Never Substance and Sexual Activity Alcohol use: Not Currently Drug use: Never  Objective:  Vitals: BP: (!) 192/91 Pulse: 100 Temp: 36.6 C (97.9 F) SpO2: (!) 90% Weight: 91.6 kg (202 lb) Height: 160 cm ('5\' 3"'$ )  Body mass index is 35.78 kg/m.  Physical Exam  GENERAL APPEARANCE Comfortable, no acute issues Development: normal Gross deformities: none  SKIN Rash, lesions, ulcers: none Induration, erythema: none Nodules: none palpable  EYES Conjunctiva and lids: normal Pupils: equal and reactive  EARS, NOSE, MOUTH, THROAT External ears: no lesion or deformity External nose: no lesion or deformity Hearing: grossly normal  NECK Symmetric: yes Trachea: midline Thyroid: no palpable nodules in the thyroid bed  CHEST Respiratory effort: normal Retraction or accessory muscle use: no Breath sounds: normal bilaterally Rales, rhonchi, wheeze: none  CARDIOVASCULAR Auscultation: regular rhythm, normal rate Murmurs: none Pulses: radial pulse 2+ palpable Lower extremity edema: none  ABDOMEN Not  assessed  GENITOURINARY/RECTAL Not assessed  MUSCULOSKELETAL Station and gait: normal Digits and nails: no clubbing or cyanosis Muscle strength: grossly normal all extremities Range  of motion: grossly normal all extremities Deformity: none  LYMPHATIC Cervical: none palpable Supraclavicular: none palpable  PSYCHIATRIC Oriented to person, place, and time: yes Mood and affect: normal for situation Judgment and insight: appropriate for situation   Assessment and Plan:  Primary hyperparathyroidism (CMS-HCC)  Patient is referred by her endocrinologist, Dr. Philemon Kingdom, for surgical evaluation and management of primary hyperparathyroidism.  Patient provided with a copy of "Parathyroid Surgery: Treatment for Your Parathyroid Gland Problem", published by [employer], 12 pages. Book reviewed and explained to patient during visit today.  Today we reviewed her clinical history. We reviewed her recent laboratory studies. Patient does have biochemical evidence of primary hyperparathyroidism. She does have complications. I have recommended proceeding with imaging studies to include an ultrasound and a nuclear medicine parathyroid scan with sestamibi. If these localize an adenoma, then I believe she will be a good candidate for minimally invasive surgery. If the studies failed to localize the adenoma, then we will proceed with a 4D CT scan of the neck. We discussed parathyroid surgery. We discussed the potential for a second gland adenoma. We discussed the size and location of the surgical incision. We discussed doing this as an outpatient procedure. We discussed restrictions on her activities after the procedure. The patient understands and agrees to proceed with imaging. We will contact her with the results when they are available.  Patient will undergo the above studies. We will contact her when the results are available and make further plans for management at that time.  Armandina Gemma,  MD [facility] A [place] practice Office: [phone]

## 2022-12-06 ENCOUNTER — Telehealth: Payer: Self-pay | Admitting: Family Medicine

## 2022-12-06 DIAGNOSIS — Z0100 Encounter for examination of eyes and vision without abnormal findings: Secondary | ICD-10-CM | POA: Diagnosis not present

## 2022-12-06 NOTE — Telephone Encounter (Signed)
LVM informing patient that I had to re-schedule her appt with the NHA on 02/08/23 to 02/04/23 due to no NHA in the office.

## 2022-12-08 NOTE — Progress Notes (Signed)
COVID Vaccine received:  '[]'$  No '[x]'$  Yes Date of any COVID positive Test in last 90 days:  PCP - Delsa Grana PA-C Cardiologist -  Endocrinology- Benjiman Core, MD  Chest x-ray -  EKG -   Stress Test -  ECHO -  Cardiac Cath -   PCR screen: '[]'$  Ordered & Completed                      '[]'$   No Order but Needs PROFEND                      '[]'$   N/A for this surgery  Surgery Plan:  '[]'$  Ambulatory                            '[]'$  Outpatient in bed                            '[]'$  Admit  Anesthesia:    '[]'$  General  '[]'$  Spinal                           '[]'$   Choice '[]'$   MAC  Bowel Prep - '[]'$  No  '[]'$   Yes _____________  Pacemaker / ICD device '[]'$  No '[]'$  Yes        Device order form faxed '[]'$  No    '[]'$   Yes      Faxed to:  Spinal Cord Stimulator:'[]'$  No '[]'$  Yes      (Remind patient to bring remote DOS) Other Implants:   History of Sleep Apnea? '[]'$  No '[]'$  Yes   CPAP used?- '[]'$  No '[]'$  Yes    Does the patient monitor blood sugar? '[]'$  No '[]'$  Yes  '[]'$  N/A  Patient has: '[]'$  Pre-DM   '[]'$  DM1  '[]'$   DM2 Does patient have a [employer] or Dexacom? '[]'$  No '[]'$  Yes   Fasting Blood Sugar Ranges-  Checks Blood Sugar _____ times a day  Last dose of GLP1 agonist-  GLP1 instructions:  Last dose of SGLT-2 inhibitors-  SGLT-2 instructions:  Other Diabetic medications/ instructions:   Blood Thinner / Instructions: Aspirin Instructions:  ERAS Protocol Ordered: '[]'$  No  '[]'$  Yes PRE-SURGERY '[]'$  ENSURE  '[]'$  G2  '[]'$  No Drink Ordered  Patient is to be NPO after:   Comments:   Activity level: Patient can / can not climb a flight of stairs without difficulty; '[]'$  No CP  '[]'$  No SOB, but would have ______   Patient can / can not perform ADLs without assistance.   Anesthesia review:   Patient denies shortness of breath, fever, cough and chest pain at PAT appointment.  Patient verbalized understanding and agreement to the Pre-Surgical Instructions that were given to them at this PAT appointment. Patient was also educated of the  need to review these PAT instructions again prior to his/her surgery.I reviewed the appropriate phone numbers to call if they have any and questions or concerns.

## 2022-12-09 ENCOUNTER — Other Ambulatory Visit: Payer: Self-pay

## 2022-12-09 ENCOUNTER — Encounter (HOSPITAL_COMMUNITY)
Admission: RE | Admit: 2022-12-09 | Discharge: 2022-12-09 | Disposition: A | Discharge status: Home / Self Care | Payer: Medicare HMO | Source: Ambulatory Visit | Attending: Surgery | Admitting: Surgery

## 2022-12-09 ENCOUNTER — Encounter (HOSPITAL_COMMUNITY): Payer: Self-pay

## 2022-12-09 VITALS — BP 156/72 | HR 60 | Temp 98.7°F | Resp 22 | Ht 63.0 in | Wt 199.0 lb

## 2022-12-09 DIAGNOSIS — Z01818 Encounter for other preprocedural examination: Secondary | ICD-10-CM | POA: Diagnosis not present

## 2022-12-09 DIAGNOSIS — R011 Cardiac murmur, unspecified: Secondary | ICD-10-CM | POA: Diagnosis not present

## 2022-12-09 DIAGNOSIS — I10 Essential (primary) hypertension: Secondary | ICD-10-CM | POA: Diagnosis not present

## 2022-12-09 HISTORY — DX: Unspecified osteoarthritis, unspecified site: M19.90

## 2022-12-09 HISTORY — DX: Cardiac murmur, unspecified: R01.1

## 2022-12-09 HISTORY — DX: Essential (primary) hypertension: I10

## 2022-12-09 HISTORY — DX: Gastro-esophageal reflux disease without esophagitis: K21.9

## 2022-12-09 LAB — BASIC METABOLIC PANEL
Anion gap: 9 (ref 5–15)
BUN: 8 mg/dL (ref 8–23)
CO2: 26 mmol/L (ref 22–32)
Calcium: 9.9 mg/dL (ref 8.9–10.3)
Chloride: 105 mmol/L (ref 98–111)
Creatinine, Ser: 0.6 mg/dL (ref 0.44–1.00)
GFR, Estimated: >60 mL/min (ref >60)
Glucose, Bld: 92 mg/dL (ref 70–99)
Potassium: 4 mmol/L (ref 3.5–5.1)
Sodium: 140 mmol/L (ref 135–145)

## 2022-12-09 LAB — CBC
HCT: 36.8 % (ref 36.0–46.0)
Hemoglobin: 11.4 g/dL — ABNORMAL LOW (ref 12.0–15.0)
MCH: 24.6 pg — ABNORMAL LOW (ref 26.0–34.0)
MCHC: 31 g/dL (ref 30.0–36.0)
MCV: 79.5 fL — ABNORMAL LOW (ref 80.0–100.0)
Platelets: 270 10*3/uL (ref 150–400)
RBC: 4.63 MIL/uL (ref 3.87–5.11)
RDW: 15.3 % (ref 11.5–15.5)
WBC: 5.6 10*3/uL (ref 4.0–10.5)
nRBC: 0 % (ref 0.0–0.2)

## 2022-12-09 NOTE — Patient Instructions (Addendum)
SURGICAL WAITING ROOM VISITATION Patients having surgery or a procedure may have no more than 2 support people in the waiting area - these visitors may rotate in the visitor waiting room.   Due to an increase in RSV and influenza rates and associated hospitalizations, children ages 59 and under may not visit patients in [place]. If the patient needs to stay at the hospital during part of their recovery, the visitor guidelines for inpatient rooms apply.  PRE-OP VISITATION  Pre-op nurse will coordinate an appropriate time for 1 support person to accompany the patient in pre-op.  This support person may not rotate.  This visitor will be contacted when the time is appropriate for the visitor to come back in the pre-op area.  Please refer to the [facility] website for the visitor guidelines for Inpatients (after your surgery is over and you are in a regular room).  You are not required to quarantine at this time prior to your surgery. However, you must do this: Hand Hygiene often Do NOT share personal items Notify your provider if you are in close contact with someone who has COVID or you develop fever 100.4 or greater, new onset of sneezing, cough, sore throat, shortness of breath or body aches.  If you test positive for Covid or have been in contact with anyone that has tested positive in the last 10 days please notify you surgeon.    Your procedure is scheduled on:  Friday December 10, 2022  Report to [facility] Main Entrance: [place] entrance where the [employer] is available.   Report to admitting at: 07:45    AM  +++++Call this number if you have any questions or problems the morning of surgery [phone]  Do not eat food after Midnight the night prior to your surgery/procedure.  After Midnight you may have the following liquids until  07:00  AM DAY OF SURGERY  Clear Liquid Diet Water Black Coffee (sugar ok, NO MILK/CREAM OR CREAMERS)  Tea (sugar ok, NO  MILK/CREAM OR CREAMERS) regular and decaf                             Plain Jell-O  with no fruit (NO RED)                                           Fruit ices (not with fruit pulp, NO RED)                                     Popsicles (NO RED)                                                                  Juice: apple, WHITE grape, WHITE cranberry Sports drinks like Gatorade or Powerade (NO RED)                FOLLOW  ANY ADDITIONAL PRE OP INSTRUCTIONS YOU RECEIVED FROM YOUR SURGEON'S OFFICE!!!   Oral Hygiene is also important to reduce your risk of infection.  Remember - BRUSH YOUR TEETH THE MORNING OF SURGERY WITH YOUR REGULAR TOOTHPASTE  Take ONLY these medicines the morning of surgery with A SIP OF WATER: None                You may not have any metal on your body including hair pins, jewelry, and body piercing  Do not wear make-up, lotions, powders, perfumes or deodorant  Do not wear nail polish including gel and S&S, artificial / acrylic nails, or any other type of covering on natural nails including finger and toenails. If you have artificial nails, gel coating, etc., that needs to be removed by a nail salon, Please have this removed prior to surgery. Not doing so may mean that your surgery could be cancelled or delayed if the Surgeon or anesthesia staff feels like they are unable to monitor you safely.   Do not shave 48 hours prior to surgery to avoid nicks in your skin which may contribute to postoperative infections.   Patients discharged on the day of surgery will not be allowed to drive home.  Someone NEEDS to stay with you for the first 24 hours after anesthesia.  Do not bring your home medications to the hospital. The Pharmacy will dispense medications listed on your medication list to you during your admission in the Hospital.   Please read over the following fact sheets you were given: IF YOU HAVE QUESTIONS ABOUT YOUR PRE-OP INSTRUCTIONS, PLEASE CALL  [phone]  ([place])   [place] - Preparing for Surgery Before surgery, you can play an important role.  Because skin is not sterile, your skin needs to be as free of germs as possible.  You can reduce the number of germs on your skin by washing with CHG (chlorahexidine gluconate) soap before surgery.  CHG is an antiseptic cleaner which kills germs and bonds with the skin to continue killing germs even after washing. Please DO NOT use if you have an allergy to CHG or antibacterial soaps.  If your skin becomes reddened/irritated stop using the CHG and inform your nurse when you arrive at Short Stay. Do not shave (including legs and underarms) for at least 48 hours prior to the first CHG shower.  You may shave your face/neck.  Please follow these instructions carefully:  1.  Shower with CHG Soap the night before surgery and the  morning of surgery.  2.  If you choose to wash your hair, wash your hair first as usual with your normal  shampoo.  3.  After you shampoo, rinse your hair and body thoroughly to remove the shampoo.                             4.  Use CHG as you would any other liquid soap.  You can apply chg directly to the skin and wash.  Gently with a scrungie or clean washcloth.  5.  Apply the CHG Soap to your body ONLY FROM THE NECK DOWN.   Do not use on face/ open                           Wound or open sores. Avoid contact with eyes, ears mouth and genitals (private parts).                       Wash face,  Genitals (private parts) with your normal soap.  6.  Wash thoroughly, paying special attention to the area where your  surgery  will be performed.  7.  Thoroughly rinse your body with warm water from the neck down.  8.  DO NOT shower/wash with your normal soap after using and rinsing off the CHG Soap.            9.  Pat yourself dry with a clean towel.            10.  Wear clean pajamas.            11.  Place clean sheets on your bed the night of your first shower and  do not  sleep with pets.  ON THE DAY OF SURGERY : Do not apply any lotions/deodorants the morning of surgery.  Please wear clean clothes to the hospital/surgery center.    FAILURE TO FOLLOW THESE INSTRUCTIONS MAY RESULT IN THE CANCELLATION OF YOUR SURGERY  PATIENT SIGNATURE_________________________________  NURSE SIGNATURE__________________________________  ________________________________________________________________________

## 2022-12-10 ENCOUNTER — Ambulatory Visit (HOSPITAL_COMMUNITY): Payer: Medicare HMO | Admitting: Anesthesiology

## 2022-12-10 ENCOUNTER — Encounter (HOSPITAL_COMMUNITY)
Admission: RE | Disposition: A | Discharge status: Home / Self Care | Payer: Self-pay | Source: Home / Self Care | Attending: Surgery

## 2022-12-10 ENCOUNTER — Encounter (HOSPITAL_COMMUNITY): Payer: Self-pay | Admitting: Surgery

## 2022-12-10 ENCOUNTER — Ambulatory Visit (HOSPITAL_COMMUNITY)
Admission: RE | Admit: 2022-12-10 | Discharge: 2022-12-10 | Disposition: A | Discharge status: Home / Self Care | Payer: Medicare HMO | Attending: Surgery | Admitting: Surgery

## 2022-12-10 ENCOUNTER — Ambulatory Visit (HOSPITAL_BASED_OUTPATIENT_CLINIC_OR_DEPARTMENT_OTHER): Payer: Medicare HMO | Admitting: Anesthesiology

## 2022-12-10 ENCOUNTER — Other Ambulatory Visit: Payer: Self-pay

## 2022-12-10 DIAGNOSIS — Z8349 Family history of other endocrine, nutritional and metabolic diseases: Secondary | ICD-10-CM | POA: Diagnosis not present

## 2022-12-10 DIAGNOSIS — D509 Iron deficiency anemia, unspecified: Secondary | ICD-10-CM

## 2022-12-10 DIAGNOSIS — E669 Obesity, unspecified: Secondary | ICD-10-CM | POA: Diagnosis not present

## 2022-12-10 DIAGNOSIS — E210 Primary hyperparathyroidism: Secondary | ICD-10-CM | POA: Diagnosis not present

## 2022-12-10 DIAGNOSIS — I10 Essential (primary) hypertension: Secondary | ICD-10-CM | POA: Diagnosis not present

## 2022-12-10 DIAGNOSIS — K219 Gastro-esophageal reflux disease without esophagitis: Secondary | ICD-10-CM | POA: Insufficient documentation

## 2022-12-10 DIAGNOSIS — Z6835 Body mass index (BMI) 35.0-35.9, adult: Secondary | ICD-10-CM | POA: Diagnosis not present

## 2022-12-10 DIAGNOSIS — M810 Age-related osteoporosis without current pathological fracture: Secondary | ICD-10-CM | POA: Diagnosis not present

## 2022-12-10 DIAGNOSIS — D351 Benign neoplasm of parathyroid gland: Secondary | ICD-10-CM | POA: Diagnosis not present

## 2022-12-10 DIAGNOSIS — M1990 Unspecified osteoarthritis, unspecified site: Secondary | ICD-10-CM | POA: Diagnosis not present

## 2022-12-10 DIAGNOSIS — R011 Cardiac murmur, unspecified: Secondary | ICD-10-CM

## 2022-12-10 DIAGNOSIS — E785 Hyperlipidemia, unspecified: Secondary | ICD-10-CM | POA: Diagnosis not present

## 2022-12-10 HISTORY — PX: PARATHYROIDECTOMY: SHX19

## 2022-12-10 SURGERY — PARATHYROIDECTOMY
Anesthesia: General | Site: Neck | Laterality: Right

## 2022-12-10 MED ORDER — 0.9 % SODIUM CHLORIDE (POUR BTL) OPTIME
TOPICAL | Status: DC | PRN
  Administered 2022-12-10: 1000 mL

## 2022-12-10 MED ORDER — ROCURONIUM BROMIDE 10 MG/ML (PF) SYRINGE
PREFILLED_SYRINGE | INTRAVENOUS | Status: DC | PRN
  Administered 2022-12-10: 50 mg via INTRAVENOUS

## 2022-12-10 MED ORDER — SUGAMMADEX SODIUM 200 MG/2ML IV SOLN
INTRAVENOUS | Status: DC | PRN
  Administered 2022-12-10: 200 mg via INTRAVENOUS

## 2022-12-10 MED ORDER — BUPIVACAINE HCL 0.25 % IJ SOLN
INTRAMUSCULAR | Status: DC | PRN
  Administered 2022-12-10: 7 mL

## 2022-12-10 MED ORDER — OXYCODONE HCL 5 MG/5ML PO SOLN: 5.0000 mg | Freq: Once | ORAL | Status: DC | PRN

## 2022-12-10 MED ORDER — FENTANYL CITRATE PF 50 MCG/ML IJ SOSY: 25.0000 ug | PREFILLED_SYRINGE | INTRAMUSCULAR | Status: DC | PRN

## 2022-12-10 MED ORDER — ONDANSETRON HCL 4 MG/2ML IJ SOLN
INTRAMUSCULAR | Status: AC
  Filled 2022-12-10: qty 2

## 2022-12-10 MED ORDER — LIDOCAINE 2% (20 MG/ML) 5 ML SYRINGE
INTRAMUSCULAR | Status: DC | PRN
  Administered 2022-12-10: 60 mg via INTRAVENOUS

## 2022-12-10 MED ORDER — PROPOFOL 10 MG/ML IV BOLUS
INTRAVENOUS | Status: DC | PRN
  Administered 2022-12-10: 200 mg via INTRAVENOUS

## 2022-12-10 MED ORDER — FENTANYL CITRATE (PF) 100 MCG/2ML IJ SOLN
INTRAMUSCULAR | Status: AC
  Filled 2022-12-10: qty 2

## 2022-12-10 MED ORDER — BUPIVACAINE HCL 0.25 % IJ SOLN
INTRAMUSCULAR | Status: AC
  Filled 2022-12-10: qty 1

## 2022-12-10 MED ORDER — FENTANYL CITRATE (PF) 100 MCG/2ML IJ SOLN
INTRAMUSCULAR | Status: DC | PRN
  Administered 2022-12-10 (×4): 50 ug via INTRAVENOUS

## 2022-12-10 MED ORDER — ONDANSETRON HCL 4 MG/2ML IJ SOLN
INTRAMUSCULAR | Status: DC | PRN
  Administered 2022-12-10: 4 mg via INTRAVENOUS

## 2022-12-10 MED ORDER — LACTATED RINGERS IV SOLN: INTRAVENOUS | Status: DC

## 2022-12-10 MED ORDER — CHLORHEXIDINE GLUCONATE 0.12 % MT SOLN
15.0000 mL | Freq: Once | OROMUCOSAL | Status: AC
  Administered 2022-12-10: 15 mL via OROMUCOSAL

## 2022-12-10 MED ORDER — OXYCODONE HCL 5 MG PO TABS: 5.0000 mg | ORAL_TABLET | Freq: Once | ORAL | Status: DC | PRN

## 2022-12-10 MED ORDER — CEFAZOLIN SODIUM-DEXTROSE 2-4 GM/100ML-% IV SOLN
2.0000 g | INTRAVENOUS | Status: AC
  Administered 2022-12-10: 2 g via INTRAVENOUS
  Filled 2022-12-10: qty 100

## 2022-12-10 MED ORDER — TRAMADOL HCL 50 MG PO TABS: 50.0000 mg | ORAL_TABLET | Freq: Four times a day (QID) | ORAL | 0 refills | Status: DC | PRN

## 2022-12-10 MED ORDER — HEMOSTATIC AGENTS (NO CHARGE) OPTIME
TOPICAL | Status: DC | PRN
  Administered 2022-12-10: 1 via TOPICAL

## 2022-12-10 MED ORDER — PROPOFOL 10 MG/ML IV BOLUS
INTRAVENOUS | Status: AC
  Filled 2022-12-10: qty 20

## 2022-12-10 MED ORDER — MIDAZOLAM HCL 2 MG/2ML IJ SOLN
INTRAMUSCULAR | Status: DC | PRN
  Administered 2022-12-10: 2 mg via INTRAVENOUS

## 2022-12-10 MED ORDER — DEXAMETHASONE SODIUM PHOSPHATE 10 MG/ML IJ SOLN
INTRAMUSCULAR | Status: DC | PRN
  Administered 2022-12-10: 4 mg via INTRAVENOUS

## 2022-12-10 MED ORDER — ACETAMINOPHEN 500 MG PO TABS
1000.0000 mg | ORAL_TABLET | Freq: Once | ORAL | Status: AC
  Administered 2022-12-10: 1000 mg via ORAL
  Filled 2022-12-10: qty 2

## 2022-12-10 MED ORDER — CHLORHEXIDINE GLUCONATE CLOTH 2 % EX PADS: 6.0000 | MEDICATED_PAD | Freq: Once | CUTANEOUS | Status: DC

## 2022-12-10 MED ORDER — MIDAZOLAM HCL 2 MG/2ML IJ SOLN
INTRAMUSCULAR | Status: AC
  Filled 2022-12-10: qty 2

## 2022-12-10 MED ORDER — AMISULPRIDE (ANTIEMETIC) 5 MG/2ML IV SOLN: 10.0000 mg | Freq: Once | INTRAVENOUS | Status: DC | PRN

## 2022-12-10 MED ORDER — ORAL CARE MOUTH RINSE: 15.0000 mL | Freq: Once | OROMUCOSAL | Status: AC

## 2022-12-10 MED ORDER — DEXAMETHASONE SODIUM PHOSPHATE 10 MG/ML IJ SOLN
INTRAMUSCULAR | Status: AC
  Filled 2022-12-10: qty 1

## 2022-12-10 MED ORDER — LIDOCAINE HCL (PF) 2 % IJ SOLN
INTRAMUSCULAR | Status: AC
  Filled 2022-12-10: qty 5

## 2022-12-10 MED ORDER — ROCURONIUM BROMIDE 10 MG/ML (PF) SYRINGE
PREFILLED_SYRINGE | INTRAVENOUS | Status: AC
  Filled 2022-12-10: qty 10

## 2022-12-10 SURGICAL SUPPLY — 35 items

## 2022-12-10 NOTE — Transfer of Care (Signed)
Immediate Anesthesia Transfer of Care Note  Patient: Danielle Le  Procedure(s) Performed: RIGHT INFERIOR PARATHYROIDECTOMY (Right: Neck)  Patient Location: PACU  Anesthesia Type:General  Level of Consciousness: awake, alert , and oriented  Airway & Oxygen Therapy: Patient Spontanous Breathing and Patient connected to face mask oxygen  Post-op Assessment: Report given to RN, Post -op Vital signs reviewed and stable, and Patient moving all extremities X 4  Post vital signs: Reviewed and stable  Last Vitals:  Vitals Value Taken Time  BP 131/73   Temp    Pulse 57 12/10/22 1025  Resp 12   SpO2 99 % 12/10/22 1025  Vitals shown include unvalidated device data.  Last Pain:  Vitals:   12/10/22 0831  TempSrc:   PainSc: 0-No pain         Complications: No notable events documented.

## 2022-12-10 NOTE — Interval H&P Note (Signed)
History and Physical Interval Note:  12/10/2022 9:04 AM  Danielle Le  has presented today for surgery, with the diagnosis of HYPERPARATHYROIDISM.  The various methods of treatment have been discussed with the patient and family. After consideration of risks, benefits and other options for treatment, the patient has consented to    Procedure(s): RIGHT INFERIOR PARATHYROIDECTOMY (Right) as a surgical intervention.    The patient's history has been reviewed, patient examined, no change in status, stable for surgery.  I have reviewed the patient's chart and labs.  Questions were answered to the patient's satisfaction.    Armandina Gemma, [facility] [facility] [place] practice Office: [place]

## 2022-12-10 NOTE — Discharge Instructions (Addendum)

## 2022-12-10 NOTE — OR Nursing (Signed)
ASSESSMENT PERFORMED BY Idolina Primer RN

## 2022-12-10 NOTE — Anesthesia Postprocedure Evaluation (Signed)
Anesthesia Post Note  Patient: Danielle Le  Procedure(s) Performed: RIGHT INFERIOR PARATHYROIDECTOMY (Right: Neck)     Patient location during evaluation: PACU Anesthesia Type: General Level of consciousness: awake Pain management: pain level controlled Vital Signs Assessment: post-procedure vital signs reviewed and stable Respiratory status: spontaneous breathing, nonlabored ventilation and respiratory function stable Cardiovascular status: blood pressure returned to baseline and stable Postop Assessment: no apparent nausea or vomiting Anesthetic complications: no   No notable events documented.  Last Vitals:  Vitals:   12/10/22 1057 12/10/22 1110  BP: (!) 166/87   Pulse: (!) 53   Resp: 14 14  Temp:    SpO2: 95%     Last Pain:  Vitals:   12/10/22 1110  TempSrc:   PainSc: 0-No pain                 Nilda Simmer

## 2022-12-10 NOTE — Op Note (Signed)
OPERATIVE REPORT - PARATHYROIDECTOMY  Preoperative diagnosis: Primary hyperparathyroidism  Postop diagnosis: Same  Procedure: Right inferior minimally invasive parathyroidectomy  Surgeon:  Armandina Gemma, MD  Anesthesia: General endotracheal  Estimated blood loss: Minimal  Preparation: ChloraPrep  Indications: Patient is referred to Dr. Philemon Kingdom for surgical evaluation and management of newly diagnosed primary hyperparathyroidism. Patient had been previously evaluated by Dr. Renato Shin. Patient has had longstanding hypercalcemia. Laboratory studies have shown calcium levels ranging as high as 11.8. Her most recent calcium was 10.8. Intact PTH level has been as high as 208 with her most recent level being 152. Patient underwent a 24-hour urine collection for calcium which was markedly elevated at 652. 25-hydroxy vitamin D level was normal at 54.9. Ultrasound and sestamibi scan localize a right inferior adenoma.  Patient now comes to surgery for parathyroidectomy.  Procedure: The patient was prepared in the pre-operative holding area. The patient was brought to the operating room and placed in a supine position on the operating room table. Following administration of general anesthesia, the patient was positioned and then prepped and draped in the usual strict aseptic fashion. After ascertaining that an adequate level of anesthesia been achieved, a neck incision was made with a #15 blade. Dissection was carried through subcutaneous tissues and platysma. Hemostasis was obtained with the electrocautery. Skin flaps were developed circumferentially and a Weitlander retractor was placed for exposure.  Strap muscles were incised in the midline. Strap muscles were reflected laterally exposing the thyroid lobe. With gentle blunt dissection the thyroid lobe was mobilized.  Dissection was carried posteriorly and an enlarged parathyroid gland was identified. It was gently mobilized. Vascular structures  were divided between small ligaclips. Care was taken to avoid the recurrent laryngeal nerve. The parathyroid gland was completely excised. It was submitted to pathology where frozen section confirmed hypercellular parathyroid tissue consistent with adenoma.  Neck was irrigated with warm saline and good hemostasis was noted. Fibrillar was placed in the operative field. Strap muscles were approximated in the midline with interrupted 3-0 Vicryl sutures. Platysma was closed with interrupted 3-0 Vicryl sutures. Marcaine was infiltrated circumferentially. Skin was closed with a running 4-0 Monocryl subcuticular suture. Wound was washed and dried and Dermabond was applied. Patient was awakened from anesthesia and brought to the recovery room. The patient tolerated the procedure well.   Armandina Gemma, [place] [facility] Office: [phone]

## 2022-12-10 NOTE — Anesthesia Procedure Notes (Signed)
Procedure Name: Intubation Date/Time: 12/10/2022 9:24 AM  Performed by: Niel Hummer, CRNAPre-anesthesia Checklist: Patient identified, Emergency Drugs available, Suction available and Patient being monitored Patient Re-evaluated:Patient Re-evaluated prior to induction Oxygen Delivery Method: Circle system utilized Preoxygenation: Pre-oxygenation with 100% oxygen Induction Type: IV induction Ventilation: Mask ventilation without difficulty and Oral airway inserted - appropriate to patient size Laryngoscope Size: Mac and 4 Grade View: Grade II Tube type: Oral Tube size: 7.0 mm Number of attempts: 1 Airway Equipment and Method: Bougie stylet Placement Confirmation: ETT inserted through vocal cords under direct vision, positive ETCO2 and breath sounds checked- equal and bilateral Secured at: 22 cm Tube secured with: Tape Dental Injury: Teeth and Oropharynx as per pre-operative assessment  Comments: Grade 2a view, unable to get bend in tube, used bougie stylet successfully.

## 2022-12-10 NOTE — Anesthesia Preprocedure Evaluation (Addendum)
Anesthesia Evaluation  Patient identified by MRN, date of birth, ID band Patient awake    Reviewed: Allergy & Precautions, NPO status , Patient's Chart, lab work & pertinent test results  History of Anesthesia Complications Negative for: history of anesthetic complications  Airway Mallampati: III  TM Distance: >3 FB Neck ROM: Full    Dental  (+) Dental Advisory Given   Pulmonary neg pulmonary ROS   Pulmonary exam normal breath sounds clear to auscultation       Cardiovascular Exercise Tolerance: Good hypertension, (-) angina (-) Past MI, (-) Cardiac Stents and (-) CABG (-) dysrhythmias + Valvular Problems/Murmurs (States she was told this several years ago but no one else has mentioned the murmur, no echo. Denies SOB, chest pain, syncope.)  Rhythm:Regular Rate:Normal + Systolic murmurs (3/6) HLD   Neuro/Psych negative neurological ROS     GI/Hepatic Neg liver ROS,GERD  Medicated,,  Endo/Other  neg diabetes  hyperparathyroidism  Renal/GU negative Renal ROS     Musculoskeletal  (+) Arthritis ,  Osteoporosis    Abdominal  (+) + obese  Peds  Hematology  (+) Blood dyscrasia (iron deficiency), anemia   Anesthesia Other Findings   Reproductive/Obstetrics                              Anesthesia Physical Anesthesia Plan  ASA: 3  Anesthesia Plan: General   Post-op Pain Management:    Induction: Intravenous  PONV Risk Score and Plan: 3 and Ondansetron, Dexamethasone and Treatment may vary due to age or medical condition  Airway Management Planned: Oral ETT  Additional Equipment:   Intra-op Plan:   Post-operative Plan: Extubation in OR  Informed Consent: I have reviewed the patients History and Physical, chart, labs and discussed the procedure including the risks, benefits and alternatives for the proposed anesthesia with the patient or authorized representative who has indicated  his/her understanding and acceptance.     Dental advisory given  Plan Discussed with: CRNA and Anesthesiologist  Anesthesia Plan Comments: (Risks of general anesthesia discussed including, but not limited to, sore throat, hoarse voice, chipped/damaged teeth, injury to vocal cords, nausea and vomiting, allergic reactions, lung infection, heart attack, stroke, and death. All questions answered. )         Anesthesia Quick Evaluation

## 2022-12-11 ENCOUNTER — Encounter (HOSPITAL_COMMUNITY): Payer: Self-pay | Admitting: Surgery

## 2022-12-13 LAB — SURGICAL PATHOLOGY

## 2023-01-04 ENCOUNTER — Ambulatory Visit: Payer: Medicare HMO | Admitting: Internal Medicine

## 2023-01-06 DIAGNOSIS — E892 Postprocedural hypoparathyroidism: Secondary | ICD-10-CM | POA: Diagnosis not present

## 2023-01-06 DIAGNOSIS — E210 Primary hyperparathyroidism: Secondary | ICD-10-CM | POA: Diagnosis not present

## 2023-01-24 ENCOUNTER — Encounter: Payer: Self-pay | Admitting: Internal Medicine

## 2023-01-24 ENCOUNTER — Ambulatory Visit: Payer: Medicare HMO | Admitting: Internal Medicine

## 2023-01-24 VITALS — BP 148/70 | HR 87

## 2023-01-24 DIAGNOSIS — M810 Age-related osteoporosis without current pathological fracture: Secondary | ICD-10-CM

## 2023-01-24 DIAGNOSIS — E559 Vitamin D deficiency, unspecified: Secondary | ICD-10-CM

## 2023-01-24 DIAGNOSIS — E213 Hyperparathyroidism, unspecified: Secondary | ICD-10-CM | POA: Diagnosis not present

## 2023-01-24 NOTE — Patient Instructions (Addendum)
Please come back for labs in 6 months and for a visit in 1 year.  Hold Boniva for 2 months and see if the muscle aches persist. If they resolve, let me know. In that case, we need a sooner bone density and a new plan.

## 2023-01-24 NOTE — Progress Notes (Signed)
Patient ID: Danielle Le, female   DOB: 1956-06-11, 67 y.o.   MRN: [id number]  HPI  Danielle Le is a 67 y.o.-year-old female, returning for follow-up for hypercalcemia/hyperparathyroidism.  She previously saw Dr. Loanne Drilling, last visit with me 4 months ago.  Interim history: She had parathyroid surgery 1.5 months ago.  She has better energy after the surgery and overall feels better. She has generalized muscle aches for approximately  2 weeks after taking Boniva on the first of every month.  She is wondering whether these are related to [facility] or statins.  Reviewed and addended history: Patient was diagnosed with hypercalcemia approximately in 2020.    She previously saw Dr. Loanne Drilling, who diagnosed primary hyperparathyroidism and suggested parathyroidectomy and referred patient to surgery.  She did not have this at the time of our first visit (09/2022) as she was not called to schedule the surgery appt.  She also had problems arranging care for her son who was not mobile.  At last visit, we confirmed primary hyperparathyroidism and referred her to Dr. Harlow Asa.  Further investigation:  Thyroid ultrasound (11/22/2022): 1 right inferior nodule measuring 0.8 x 0.6 x 0.6 cm, possibly parathyroid adenoma.  Technetium sestamibi scan (11/23/2022): Right inferior increase activity consistent with adenoma  Right inferior parathyroidectomy by Dr. Harlow Asa 12/10/2022: 0.781 g enlarged hypercellular parathyroid tissue consistent with adenoma  I reviewed pt's pertinent labs: 01/06/2023: PTH 60, calcium 10.1 (8.7-10.3) Lab Results  Component Value Date   PTH 152 (H) 09/03/2022   PTH 136 (H) 06/10/2022   PTH 208 (H) 01/13/2022   PTH 98 (H) 07/23/2021   PTH 81 (H) 01/19/2021   PTH 129 (H) 07/22/2020   PTH 106 (H) 04/30/2020   CALCIUM 9.9 12/09/2022   CALCIUM 10.8 (H) 09/03/2022   CALCIUM 11.4 (H) 06/10/2022   CALCIUM 11.8 (H) 01/13/2022   CALCIUM 11.0 (H) 07/23/2021   CALCIUM 11.2 (H) 07/23/2021   CALCIUM  11.4 (H) 01/19/2021   CALCIUM 10.8 (H) 10/02/2020   CALCIUM 10.9 (H) 07/22/2020   CALCIUM 11.3 (H) 04/30/2020  09/20/2022: Ionized calcium 5.6 (4.7-5.5)  Other pertinent investigation was reviewed -normal SPEP, vitamin A, phosphorus, PTH RP, but elevated calcitriol: Component     Latest Ref Rng 07/22/2020  Total Protein     6.1 - 8.1 g/dL 7.3   Albumin ELP     3.8 - 4.8 g/dL 4.3   Alpha 1     0.2 - 0.3 g/dL 0.3   Alpha 2     0.5 - 0.9 g/dL 0.7   Beta Globulin     0.4 - 0.6 g/dL 0.5   Beta 2     0.2 - 0.5 g/dL 0.4   Gamma Globulin     0.8 - 1.7 g/dL 1.1   SPE Interp. --   Vitamin D 1, 25 (OH) Total     18 - 72 pg/mL 74 (H)   Vitamin D3 1, 25 (OH)     pg/mL 54   Vitamin D2 1, 25 (OH)     pg/mL 20   Vitamin A (Retinoic Acid)     38 - 98 mcg/dL 43   PTH-Related Protein (PTH-RP)     11 - 20 pg/mL 13   Phosphorus     2.3 - 4.6 mg/dL 3.1     Component     Latest Ref Rng 09/03/2022  Vitamin D, 25-Hydroxy     30.0 - 100.0 ng/mL 54.9   PTH, Intact     15 -  65 pg/mL 152 (H)   Calcium Ionized     4.7 - 5.5 mg/dL 5.6 (H)   Magnesium     1.6 - 2.3 mg/dL 2.5 (H)    Component     Latest Ref Rng 09/06/2022  Creatinine, 24H Ur     0.50 - 2.15 g/24 h 1.22   Calcium, 24H Urine     mg/24 h 142   FECa 0.0067, which is lower than 0.01.   Component     Latest Ref Rng 09/29/2022  Creatinine, 24H Ur     0.50 - 2.15 g/24 h 1.12   Calcium, 24H Urine     mg/24 h 652 (H)   FECa is now 0.034.  I referred her to surgery.  She has a history of osteoporosis: Date L1-L4 T score FN T score 33% distal Radius Ultra distal radius  02/24/2021 (Opp at [facility] ) N/a RFN: -2.3 LFN: -2.6 -2.2 -3.2   She was started on Boniva 01/2021 - tolerated well.  She has generalized aches and pains.  Of note, she also has a history of GERD and is on omeprazole.  No fractures or falls.   No h/o kidney stones.  No h/o CKD. Last BUN/Cr: Lab Results  Component Value Date    BUN 8 12/09/2022   BUN 11 09/03/2022   CREATININE 0.60 12/09/2022   CREATININE 0.63 09/03/2022   Pt is not on HCTZ.  She has a h/o vitamin D deficiency. Reviewed vit D levels: Lab Results  Component Value Date   VD25OH 54.9 09/03/2022   VD25OH 46 06/10/2022   VD25OH 43 01/13/2022   VD25OH 50.94 07/23/2021   VD25OH 52.28 01/19/2021   VD25OH 24.92 (L) 07/22/2020   VD25OH 40 04/30/2020   VD25OH 18 (L) 02/05/2020  On vitamin D 5000 units daily.  Pt is not on calcium.  Pt does not have a FH of hypercalcemia, pituitary tumors, thyroid cancer, or osteoporosis.   Pt. also has a history of cataract, anemia.   ROS: + see HPI  Past Medical History:  Diagnosis Date   Anemia    Arthritis    Cataract    GERD (gastroesophageal reflux disease)    Heart murmur    Hypertension    Past Surgical History:  Procedure Laterality Date   ABDOMINAL HYSTERECTOMY     CATARACT EXTRACTION W/PHACO Left 07/04/2019   Procedure: CATARACT EXTRACTION PHACO AND INTRAOCULAR LENS PLACEMENT ([place]) left  00:43.2  12.4%  5.40;  Surgeon: Leandrew Koyanagi, MD;  Location: [place];  Service: Ophthalmology;  Laterality: Left;   CATARACT EXTRACTION W/PHACO Right 07/25/2019   Procedure: CATARACT EXTRACTION PHACO AND INTRAOCULAR LENS PLACEMENT ([facility]) RIGHT  00:41.9  12.4%  5.18;  Surgeon: Leandrew Koyanagi, MD;  Location: [place];  Service: Ophthalmology;  Laterality: Right;   CESAREAN SECTION     COLONOSCOPY WITH PROPOFOL N/A 01/04/2019   Procedure: COLONOSCOPY WITH PROPOFOL;  Surgeon: Lin Landsman, MD;  Location: [facility] ENDOSCOPY;  Service: Gastroenterology;  Laterality: N/A;   ESOPHAGOGASTRODUODENOSCOPY (EGD) WITH PROPOFOL N/A 01/04/2019   Procedure: ESOPHAGOGASTRODUODENOSCOPY (EGD) WITH PROPOFOL;  Surgeon: Lin Landsman, MD;  Location: [facility] ENDOSCOPY;  Service: Gastroenterology;  Laterality: N/A;   JOINT REPLACEMENT Left 03/18/2022   total knee replacement by  Dr.  Harlow Mares in [place] Right 12/10/2022   Procedure: RIGHT INFERIOR PARATHYROIDECTOMY;  Surgeon: Armandina Gemma, MD;  Location: [facility];  Service: General;  Laterality: Right;   Social History   Socioeconomic  History   Marital status: Divorced    Spouse name: Not on file   Number of children: 1   Years of education: 106   Highest education level: Associate degree: occupational, Hotel manager, or vocational program  Occupational History   Occupation: stay home mom  Tobacco Use   Smoking status: Never   Smokeless tobacco: Never  Vaping Use   Vaping Use: Never used  Substance and Sexual Activity   Alcohol use: Not Currently   Drug use: Not Currently   Sexual activity: Not Currently  Other Topics Concern   Not on file  Social History Narrative   Pt has 69 year old special needs son that lives at home who is non ambulatory/non verbal and she is primary caregiver.    Social Determinants of Health   Financial Resource Strain: Low Risk  (02/02/2022)   Overall Financial Resource Strain (CARDIA)    Difficulty of Paying Living Expenses: Not very hard  Food Insecurity: No Food Insecurity (02/02/2022)   Hunger Vital Sign    Worried About Running Out of Food in the Last Year: Never true    Ran Out of Food in the Last Year: Never true  Transportation Needs: No Transportation Needs (02/02/2022)   PRAPARE - Hydrologist (Medical): No    Lack of Transportation (Non-Medical): No  Physical Activity: Inactive (02/02/2022)   Exercise Vital Sign    Days of Exercise per Week: 0 days    Minutes of Exercise per Session: 0 min  Stress: No Stress Concern Present (02/02/2022)   [place]    Feeling of Stress : Not at all  Social Connections: Socially Isolated (02/02/2022)   Social Connection and Isolation Panel [NHANES]    Frequency of Communication with Friends and Family: More than three times a week     Frequency of Social Gatherings with Friends and Family: Twice a week    Attends Religious Services: Never    Marine scientist or Organizations: No    Attends Archivist Meetings: Never    Marital Status: Divorced  Human resources officer Violence: Not At Risk (02/02/2022)   Humiliation, Afraid, Rape, and Kick questionnaire    Fear of Current or Ex-Partner: No    Emotionally Abused: No    Physically Abused: No    Sexually Abused: No   Current Outpatient Medications on File Prior to Visit  Medication Sig Dispense Refill   acetaminophen (TYLENOL) 500 MG tablet Take 1,000 mg by mouth every 8 (eight) hours as needed for moderate pain or mild pain.     aspirin EC 81 MG tablet Take 81 mg by mouth at bedtime.     Cholecalciferol (VITAMIN D) 125 MCG (5000 UT) CAPS Take 5,000 Units by mouth at bedtime.     Glycerin-Hypromellose-PEG 400 (ARTIFICIAL TEARS) 0.2-0.2-1 % SOLN Place 1 drop into both eyes daily as needed (Dry eyes).     ibandronate (BONIVA) 150 MG tablet TAKE 1 TABLET (150 MG TOTAL) BY MOUTH EVERY 30 (THIRTY) DAYS. TAKE IN THE MORNING WITH A FULL GLASS OF WATER, ON AN EMPTY STOMACH, AND DO NOT TAKE ANYTHING ELSE BY MOUTH OR LIE DOWN FOR THE NEXT 30 MIN. 3 tablet 3   OMEPRAZOLE PO Take 1 capsule by mouth at bedtime.     rosuvastatin (CRESTOR) 10 MG tablet Take 1 tablet (10 mg total) by mouth daily. 90 tablet 3   traMADol (ULTRAM) 50 MG tablet Take 1  tablet (50 mg total) by mouth every 6 (six) hours as needed for moderate pain. 15 tablet 0   zinc gluconate 50 MG tablet Take 50 mg by mouth daily.     No current facility-administered medications on file prior to visit.   No Known Allergies Family History  Problem Relation Age of Onset   Hypertension Mother    Hyperlipidemia Mother    Dementia Father    Hypertension Father    AVM Brother    Anxiety disorder Son    Seizures Son    Dementia Paternal Grandfather    Hypertension Brother    Hypercalcemia Neg Hx    PE: BP (!)  148/70   Pulse 87   SpO2 97%  Wt Readings from Last 3 Encounters:  12/10/22 199 lb (90.3 kg)  12/09/22 199 lb (90.3 kg)  09/03/22 206 lb (93.4 kg)   Constitutional: overweight, in NAD Eyes:  EOMI, no exophthalmos ENT: no neck masses, no cervical lymphadenopathy Cardiovascular: RRR, No MRG Respiratory: CTA B Musculoskeletal: no deformities Skin:no rashes, parathyroidectomy scar healing, without erythema, induration, sensitivity, significant swelling. Neurological: no tremor with outstretched hands  Assessment: 1. Primary hyperparathyroidism  2.  Vitamin D deficiency  3.  Osteoporosis  Plan: Patient has had several instances of elevated calcium, with the highest level being at 11.8. A corresponding intact PTH level was also high, at 208. - She previously had investigation for hypercalcemia to include a normal vitamin A, PTH RP, and SPEP.  As expected in the setting of primary hyperparathyroidism, the calcitriol level was high. - You are we proceeded with further investigation at last visit: A 24-hour urine calcium level yielded a low fractional injection of calcium, however, we repeated this and the new fraction was actually normal.  Ionized calcium was elevated, as was her PTH (152) She met 1 criterion  for parathyroid surgery (osteoporosis). Reviewed previous DXA scan report and I explained that the 33% distal radius reflects cortical bone quality, which is most likely affected by hyperparathyroidism.  This score was low for her. - I referred her to surgery and she had further imaging and then right inferior parathyroidectomy 12/10/2022 by Dr. Harlow Asa.  This showed a 0.781 g enlarged hypercellular parathyroid tissue consistent with parathyroid adenoma - Postop labs showed a normal calcium and PTH at the beginning of this month.  At today's visit, we will not repeat her calcium, PTH, and vitamin D level, but will do so in 6 months - I will see the patient back in 1 year but with labs at 6  mo  2.  Vitamin D deficiency -She can is on 5000 vitamin D daily -Latest level was normal 09/2022 -Will recheck the vitamin D level in 6 mo  3.  Osteoporosis -Reviewed previous bone density from 2022.  She is due for another bone density scan later this year -She continues on Boniva, which she tolerates well, despite a history of GERD.  However, she has muscle aches for approximately 2 weeks after she takes Boniva on the first day of every month.  We discussed that the only way we can figure out whether this is from [place] or not this was stopped Boniva for approximately 2 months.  I advised her to let me know. -Ibadronate was not shown to improve fracture risk at the hips, so after the next bone density, we may need to change this to alendronate or change to an injectable and osteoporotic medication -I expect this should improve after her surgery -I  will be in touch with her after the results of the new bone density return  Orders Placed This Encounter  Procedures   Vitamin D, 25-hydroxy   Parathyroid hormone, intact (no Ca)   Calcium, ionized   Philemon Kingdom, MD PhD [facility] Endocrinology

## 2023-02-11 ENCOUNTER — Ambulatory Visit (INDEPENDENT_AMBULATORY_CARE_PROVIDER_SITE_OTHER): Payer: Medicare HMO

## 2023-02-11 VITALS — Ht 63.0 in | Wt 199.0 lb

## 2023-02-11 DIAGNOSIS — Z0000 Encounter for general adult medical examination without abnormal findings: Secondary | ICD-10-CM | POA: Diagnosis not present

## 2023-02-11 NOTE — Progress Notes (Signed)
I connected with  Lyndal Rainbow on 02/11/23 by a audio enabled telemedicine application and verified that I am speaking with the correct person using two identifiers.  Patient Location: Home  Provider Location: Office/Clinic  I discussed the limitations of evaluation and management by telemedicine. The patient expressed understanding and agreed to proceed.  Subjective:   Danielle Le is a 67 y.o. female who presents for Medicare Annual (Subsequent) preventive examination.  Review of Systems    Cardiac Risk Factors include: advanced age (>1men, >24 women);dyslipidemia;obesity (BMI >30kg/m2)    Objective:    Today's Vitals   02/11/23 1301  Weight: 199 lb (90.3 kg)  Height: 5\' 3"  (1.6 m)   Body mass index is 35.25 kg/m.     02/11/2023    1:11 PM 12/09/2022    2:28 PM 02/02/2022    3:43 PM 01/29/2021    3:45 PM 01/18/2020    9:09 AM 07/25/2019    8:46 AM 07/04/2019    7:53 AM  Advanced Directives  Does Patient Have a Medical Advance Directive? No No No No No No No  Would patient like information on creating a medical advance directive?  No - Patient declined Yes (MAU/Ambulatory/Procedural Areas - Information given) Yes (MAU/Ambulatory/Procedural Areas - Information given) Yes (MAU/Ambulatory/Procedural Areas - Information given) No - Patient declined Yes (MAU/Ambulatory/Procedural Areas - Information given)    Current Medications (verified) Outpatient Encounter Medications as of 02/11/2023  Medication Sig   acetaminophen (TYLENOL) 500 MG tablet Take 1,000 mg by mouth every 8 (eight) hours as needed for moderate pain or mild pain.   aspirin EC 81 MG tablet Take 81 mg by mouth at bedtime.   Cholecalciferol (VITAMIN D) 125 MCG (5000 UT) CAPS Take 5,000 Units by mouth at bedtime.   Glycerin-Hypromellose-PEG 400 (ARTIFICIAL TEARS) 0.2-0.2-1 % SOLN Place 1 drop into both eyes daily as needed (Dry eyes).   OMEPRAZOLE PO Take 1 capsule by mouth at bedtime.   rosuvastatin (CRESTOR) 10 MG  tablet Take 1 tablet (10 mg total) by mouth daily.   traMADol (ULTRAM) 50 MG tablet Take 1 tablet (50 mg total) by mouth every 6 (six) hours as needed for moderate pain.   zinc gluconate 50 MG tablet Take 50 mg by mouth daily.   ibandronate (BONIVA) 150 MG tablet TAKE 1 TABLET (150 MG TOTAL) BY MOUTH EVERY 30 (THIRTY) DAYS. TAKE IN THE MORNING WITH A FULL GLASS OF WATER, ON AN EMPTY STOMACH, AND DO NOT TAKE ANYTHING ELSE BY MOUTH OR LIE DOWN FOR THE NEXT 30 MIN. (Patient not taking: Reported on 02/11/2023)   No facility-administered encounter medications on file as of 02/11/2023.    Allergies (verified) Patient has no known allergies.   History: Past Medical History:  Diagnosis Date   Anemia    Arthritis    Cataract    GERD (gastroesophageal reflux disease)    Heart murmur    Hypertension    Past Surgical History:  Procedure Laterality Date   ABDOMINAL HYSTERECTOMY     CATARACT EXTRACTION W/PHACO Left 07/04/2019   Procedure: CATARACT EXTRACTION PHACO AND INTRAOCULAR LENS PLACEMENT (IOC) left  00:43.2  12.4%  5.40;  Surgeon: Lockie Mola, MD;  Location: [facility];  Service: Ophthalmology;  Laterality: Left;   CATARACT EXTRACTION W/PHACO Right 07/25/2019   Procedure: CATARACT EXTRACTION PHACO AND INTRAOCULAR LENS PLACEMENT (IOC) RIGHT  00:41.9  12.4%  5.18;  Surgeon: Lockie Mola, MD;  Location: [facility];  Service: Ophthalmology;  Laterality: Right;  CESAREAN SECTION     COLONOSCOPY WITH PROPOFOL N/A 01/04/2019   Procedure: COLONOSCOPY WITH PROPOFOL;  Surgeon: Toney Reil, MD;  Location: [facility];  Service: Gastroenterology;  Laterality: N/A;   ESOPHAGOGASTRODUODENOSCOPY (EGD) WITH PROPOFOL N/A 01/04/2019   Procedure: ESOPHAGOGASTRODUODENOSCOPY (EGD) WITH PROPOFOL;  Surgeon: Toney Reil, MD;  Location: [facility];  Service: Gastroenterology;  Laterality: N/A;   JOINT REPLACEMENT Left 03/18/2022   total knee replacement by   Dr. Odis Luster in [place]   PARATHYROIDECTOMY Right 12/10/2022   Procedure: RIGHT INFERIOR PARATHYROIDECTOMY;  Surgeon: Darnell Level, MD;  Location: [facility];  Service: General;  Laterality: Right;   Family History  Problem Relation Age of Onset   Hypertension Mother    Hyperlipidemia Mother    Dementia Father    Hypertension Father    AVM Brother    Anxiety disorder Son    Seizures Son    Dementia Paternal Grandfather    Hypertension Brother    Hypercalcemia Neg Hx    Social History   Socioeconomic History   Marital status: Divorced    Spouse name: Not on file   Number of children: 1   Years of education: 12   Highest education level: Associate degree: occupational, Scientist, product/process development, or vocational program  Occupational History   Occupation: stay home mom  Tobacco Use   Smoking status: Never   Smokeless tobacco: Never  Vaping Use   Vaping Use: Never used  Substance and Sexual Activity   Alcohol use: Not Currently   Drug use: Not Currently   Sexual activity: Not Currently  Other Topics Concern   Not on file  Social History Narrative   Pt has 69 year old special needs son that lives at home who is non ambulatory/non verbal and she is primary caregiver.    Social Determinants of Health   Financial Resource Strain: Low Risk  (02/11/2023)   Overall Financial Resource Strain (CARDIA)    Difficulty of Paying Living Expenses: Not very hard  Food Insecurity: No Food Insecurity (02/11/2023)   Hunger Vital Sign    Worried About Running Out of Food in the Last Year: Never true    Ran Out of Food in the Last Year: Never true  Transportation Needs: No Transportation Needs (02/11/2023)   PRAPARE - Administrator, Civil Service (Medical): No    Lack of Transportation (Non-Medical): No  Physical Activity: Insufficiently Active (02/11/2023)   Exercise Vital Sign    Days of Exercise per Week: 3 days    Minutes of Exercise per Session: 30 min  Stress: No Stress Concern Present  (02/11/2023)   [employer] of Occupational Health - Occupational Stress Questionnaire    Feeling of Stress : Not at all  Social Connections: Socially Isolated (02/11/2023)   Social Connection and Isolation Panel [NHANES]    Frequency of Communication with Friends and Family: More than three times a week    Frequency of Social Gatherings with Friends and Family: Twice a week    Attends Religious Services: Never    Database administrator or Organizations: No    Attends Engineer, structural: Never    Marital Status: Divorced    Tobacco Counseling Counseling given: Not Answered  Clinical Intake:  Pre-visit preparation completed: Yes  Pain : No/denies pain   BMI - recorded: 35.25 Nutritional Status: BMI > 30  Obese Nutritional Risks: None Diabetes: No  How often do you need to have someone help you when you read instructions,  pamphlets, or other written materials from your doctor or pharmacy?: 1 - Never  Diabetic?no Interpreter Needed?: No Comments: son lives with her Information entered by :: B.Tamiah Dysart,LPN   Activities of Daily Living    02/11/2023    1:12 PM 12/09/2022    2:31 PM  In your present state of health, do you have any difficulty performing the following activities:  Hearing? 0   Vision? 0   Difficulty concentrating or making decisions? 1   Walking or climbing stairs? 1   Dressing or bathing? 0   Doing errands, shopping? 0 0  Preparing Food and eating ? N   Using the Toilet? N   In the past six months, have you accidently leaked urine? N   Do you have problems with loss of bowel control? N   Managing your Medications? N   Managing your Finances? N   Housekeeping or managing your Housekeeping? N     Patient Care Team: [name], PA-C as PCP - General (Family Medicine) Romero Belling, MD (Inactive) as Consulting Physician (Endocrinology)  Indicate any recent Medical Services you may have received from other than [facility] providers in the past  year (date may be approximate).     Assessment:   This is a routine wellness examination for Danielle Le.  Hearing/Vision screen Hearing Screening - Comments:: Adequate hearing Vision Screening - Comments:: Adequate eye w/glasses [facility]  Dietary issues and exercise activities discussed: Current Exercise Habits: Home exercise routine, Type of exercise: walking, Time (Minutes): 30, Frequency (Times/Week): 3, Weekly Exercise (Minutes/Week): 90, Intensity: Mild, Exercise limited by: orthopedic condition(s)   Goals Addressed             This Visit's Progress    Increase physical activity   On track    Recommend increasing physical activity to at least 3 times per week        Depression Screen    02/11/2023    1:10 PM 06/10/2022    1:49 PM 02/02/2022    3:42 PM 07/29/2021    3:59 PM 05/22/2021    2:36 PM 01/29/2021    3:44 PM 09/08/2020    1:16 PM  PHQ 2/9 Scores  PHQ - 2 Score 0 0 0 0 0 0 0  PHQ- 9 Score  0   0      Fall Risk    02/11/2023    1:04 PM 06/10/2022    1:49 PM 02/02/2022    3:44 PM 07/29/2021    3:36 PM 05/22/2021    2:30 PM  Fall Risk   Falls in the past year? 0 0 0 0 0  Number falls in past yr: 0 0 0 0 0  Injury with Fall? 0 0 0  0  Risk for fall due to : No Fall Risks No Fall Risks No Fall Risks    Follow up Education provided;Falls prevention discussed Falls prevention discussed;Education provided Falls prevention discussed Falls prevention discussed     FALL RISK PREVENTION PERTAINING TO THE HOME:  Any stairs in or around the home? Yes  If so, are there any without handrails? Yes  Home free of loose throw rugs in walkways, pet beds, electrical cords, etc? Yes  Adequate lighting in your home to reduce risk of falls? Yes   ASSISTIVE DEVICES UTILIZED TO PREVENT FALLS:  Life alert? No  Use of a cane, walker or w/c? No  Grab bars in the bathroom? no Shower chair or bench in shower? Yes  Elevated toilet  seat or a handicapped toilet? Yes    Cognitive Function:        02/11/2023    1:15 PM 01/18/2020    9:14 AM  6CIT Screen  What Year? 0 points 0 points  What month? 0 points 0 points  What time? 0 points 0 points  Count back from 20 0 points 0 points  Months in reverse 0 points 0 points  Repeat phrase 0 points 0 points  Total Score 0 points 0 points    Immunizations Immunization History  Administered Date(s) Administered   Moderna Sars-Covid-2 Vaccination 01/11/2020, 02/06/2020, 09/08/2020    TDAP status: Due, Education has been provided regarding the importance of this vaccine. Advised may receive this vaccine at local pharmacy or Health Dept. Aware to provide a copy of the vaccination record if obtained from local pharmacy or Health Dept. Verbalized acceptance and understanding.  Flu Vaccine status: Declined, Education has been provided regarding the importance of this vaccine but patient still declined. Advised may receive this vaccine at local pharmacy or Health Dept. Aware to provide a copy of the vaccination record if obtained from local pharmacy or Health Dept. Verbalized acceptance and understanding.  Pneumococcal vaccine status: Declined,  Education has been provided regarding the importance of this vaccine but patient still declined. Advised may receive this vaccine at local pharmacy or Health Dept. Aware to provide a copy of the vaccination record if obtained from local pharmacy or Health Dept. Verbalized acceptance and understanding.   Covid-19 vaccine status: Completed vaccines  Qualifies for Shingles Vaccine? Yes   Zostavax completed No   Shingrix Completed?: No.    Education has been provided regarding the importance of this vaccine. Patient has been advised to call insurance company to determine out of pocket expense if they have not yet received this vaccine. Advised may also receive vaccine at local pharmacy or Health Dept. Verbalized acceptance and understanding.  Screening Tests Health  Maintenance  Topic Date Due   DTaP/Tdap/Td (1 - Tdap) Never done   Zoster Vaccines- Shingrix (1 of 2) Never done   COVID-19 Vaccine (4 - 2023-24 season) 07/02/2022   Pneumonia Vaccine 24+ Years old (1 of 1 - PCV) 06/18/2023 (Originally 11/02/2018)   DEXA SCAN  02/25/2023   INFLUENZA VACCINE  06/02/2023   MAMMOGRAM  07/15/2023   Medicare Annual Wellness (AWV)  02/11/2024   COLONOSCOPY (Pts 45-9yrs Insurance coverage will need to be confirmed)  01/03/2026   Hepatitis C Screening  Completed   HPV VACCINES  Aged Out    Health Maintenance  Health Maintenance Due  Topic Date Due   DTaP/Tdap/Td (1 - Tdap) Never done   Zoster Vaccines- Shingrix (1 of 2) Never done   COVID-19 Vaccine (4 - 2023-24 season) 07/02/2022    Colorectal cancer screening: Type of screening: Colonoscopy. Completed yes. Repeat every 7 years  Mammogram status: Completed yes. Repeat every year  Bone Density status: Completed yes. Results reflect: Bone density results: OSTEOPOROSIS. Repeat every 5 years.  Lung Cancer Screening: (Low Dose CT Chest recommended if Age 46-80 years, 30 pack-year currently smoking OR have quit w/in 15years.) does not qualify.   Lung Cancer Screening Referral: no  Additional Screening:  Hepatitis C Screening: does not qualify; Completed yes  Vision Screening: Recommended annual ophthalmology exams for early detection of glaucoma and other disorders of the eye. Is the patient up to date with their annual eye exam?  Yes  Who is the provider or what is the name of the office  in which the patient attends annual eye exams? Dr Inez Pilgrim If pt is not established with a provider, would they like to be referred to a provider to establish care? No .   Dental Screening: Recommended annual dental exams for proper oral hygiene  Community Resource Referral / Chronic Care Management: CRR required this visit?  No   CCM required this visit?  No    Plan:     I have personally reviewed and noted  the following in the patient's chart:   Medical and social history Use of alcohol, tobacco or illicit drugs  Current medications and supplements including opioid prescriptions. Patient is not currently taking opioid prescriptions. Functional ability and status Nutritional status Physical activity Advanced directives List of other physicians Hospitalizations, surgeries, and ER visits in previous 12 months Vitals Screenings to include cognitive, depression, and falls Referrals and appointments  In addition, I have reviewed and discussed with patient certain preventive protocols, quality metrics, and best practice recommendations. A written personalized care plan for preventive services as well as general preventive health recommendations were provided to patient.   Sue Lush, LPN   11/19/1476   Nurse Notes: pt relays she has stopped taking Boniva (as she was experiencing terrible aching while on it). She says pain/aching has subsided since stopping. Pt states she is doing well since parathyroidectomy procedure in February.  *appt made w/PCP for annual per pt request.

## 2023-02-11 NOTE — Patient Instructions (Signed)
Danielle Le , Thank you for taking time to come for your Medicare Wellness Visit. I appreciate your ongoing commitment to your health goals. Please review the following plan we discussed and let me know if I can assist you in the future.   These are the goals we discussed:  Goals      Increase physical activity     Recommend increasing physical activity to at least 3 times per week         This is a list of the screening recommended for you and due dates:  Health Maintenance  Topic Date Due   DTaP/Tdap/Td vaccine (1 - Tdap) Never done   Zoster (Shingles) Vaccine (1 of 2) Never done   COVID-19 Vaccine (4 - 2023-24 season) 07/02/2022   Pneumonia Vaccine (1 of 1 - PCV) 06/18/2023*   DEXA scan (bone density measurement)  02/25/2023   Flu Shot  06/02/2023   Mammogram  07/15/2023   Medicare Annual Wellness Visit  02/11/2024   Colon Cancer Screening  01/03/2026   Hepatitis C Screening: USPSTF Recommendation to screen - Ages 18-79 yo.  Completed   HPV Vaccine  Aged Out  *Topic was postponed. The date shown is not the original due date.    Advanced directives: non  Conditions/risks identified: low falls risk  Next appointment: Follow up in one year for your annual wellness visit 02/16/2024 @1pm  telephone   Preventive Care 65 Years and Older, Female Preventive care refers to lifestyle choices and visits with your health care provider that can promote health and wellness. What does preventive care include? A yearly physical exam. This is also called an annual well check. Dental exams once or twice a year. Routine eye exams. Ask your health care provider how often you should have your eyes checked. Personal lifestyle choices, including: Daily care of your teeth and gums. Regular physical activity. Eating a healthy diet. Avoiding tobacco and drug use. Limiting alcohol use. Practicing safe sex. Taking low-dose aspirin every day. Taking vitamin and mineral supplements as recommended  by your health care provider. What happens during an annual well check? The services and screenings done by your health care provider during your annual well check will depend on your age, overall health, lifestyle risk factors, and family history of disease. Counseling  Your health care provider may ask you questions about your: Alcohol use. Tobacco use. Drug use. Emotional well-being. Home and relationship well-being. Sexual activity. Eating habits. History of falls. Memory and ability to understand (cognition). Work and work Astronomer. Reproductive health. Screening  You may have the following tests or measurements: Height, weight, and BMI. Blood pressure. Lipid and cholesterol levels. These may be checked every 5 years, or more frequently if you are over 20 years old. Skin check. Lung cancer screening. You may have this screening every year starting at age 7 if you have a 30-pack-year history of smoking and currently smoke or have quit within the past 15 years. Fecal occult blood test (FOBT) of the stool. You may have this test every year starting at age 46. Flexible sigmoidoscopy or colonoscopy. You may have a sigmoidoscopy every 5 years or a colonoscopy every 10 years starting at age 46. Hepatitis C blood test. Hepatitis B blood test. Sexually transmitted disease (STD) testing. Diabetes screening. This is done by checking your blood sugar (glucose) after you have not eaten for a while (fasting). You may have this done every 1-3 years. Bone density scan. This is done to screen for osteoporosis. You  may have this done starting at age 67. Mammogram. This may be done every 1-2 years. Talk to your health care provider about how often you should have regular mammograms. Talk with your health care provider about your test results, treatment options, and if necessary, the need for more tests. Vaccines  Your health care provider may recommend certain vaccines, such as: Influenza  vaccine. This is recommended every year. Tetanus, diphtheria, and acellular pertussis (Tdap, Td) vaccine. You may need a Td booster every 10 years. Zoster vaccine. You may need this after age 28. Pneumococcal 13-valent conjugate (PCV13) vaccine. One dose is recommended after age 86. Pneumococcal polysaccharide (PPSV23) vaccine. One dose is recommended after age 60. Talk to your health care provider about which screenings and vaccines you need and how often you need them. This information is not intended to replace advice given to you by your health care provider. Make sure you discuss any questions you have with your health care provider. Document Released: 11/14/2015 Document Revised: 07/07/2016 Document Reviewed: 08/19/2015 Elsevier Interactive Patient Education  2017 [place] Prevention in the Home Falls can cause injuries. They can happen to people of all ages. There are many things you can do to make your home safe and to help prevent falls. What can I do on the outside of my home? Regularly fix the edges of walkways and driveways and fix any cracks. Remove anything that might make you trip as you walk through a door, such as a raised step or threshold. Trim any bushes or trees on the path to your home. Use bright outdoor lighting. Clear any walking paths of anything that might make someone trip, such as rocks or tools. Regularly check to see if handrails are loose or broken. Make sure that both sides of any steps have handrails. Any raised decks and porches should have guardrails on the edges. Have any leaves, snow, or ice cleared regularly. Use sand or salt on walking paths during winter. Clean up any spills in your garage right away. This includes oil or grease spills. What can I do in the bathroom? Use night lights. Install grab bars by the toilet and in the tub and shower. Do not use towel bars as grab bars. Use non-skid mats or decals in the tub or shower. If you  need to sit down in the shower, use a plastic, non-slip stool. Keep the floor dry. Clean up any water that spills on the floor as soon as it happens. Remove soap buildup in the tub or shower regularly. Attach bath mats securely with double-sided non-slip rug tape. Do not have throw rugs and other things on the floor that can make you trip. What can I do in the bedroom? Use night lights. Make sure that you have a light by your bed that is easy to reach. Do not use any sheets or blankets that are too big for your bed. They should not hang down onto the floor. Have a firm chair that has side arms. You can use this for support while you get dressed. Do not have throw rugs and other things on the floor that can make you trip. What can I do in the kitchen? Clean up any spills right away. Avoid walking on wet floors. Keep items that you use a lot in easy-to-reach places. If you need to reach something above you, use a strong step stool that has a grab bar. Keep electrical cords out of the way. Do not use floor  polish or wax that makes floors slippery. If you must use wax, use non-skid floor wax. Do not have throw rugs and other things on the floor that can make you trip. What can I do with my stairs? Do not leave any items on the stairs. Make sure that there are handrails on both sides of the stairs and use them. Fix handrails that are broken or loose. Make sure that handrails are as long as the stairways. Check any carpeting to make sure that it is firmly attached to the stairs. Fix any carpet that is loose or worn. Avoid having throw rugs at the top or bottom of the stairs. If you do have throw rugs, attach them to the floor with carpet tape. Make sure that you have a light switch at the top of the stairs and the bottom of the stairs. If you do not have them, ask someone to add them for you. What else can I do to help prevent falls? Wear shoes that: Do not have high heels. Have rubber  bottoms. Are comfortable and fit you well. Are closed at the toe. Do not wear sandals. If you use a stepladder: Make sure that it is fully opened. Do not climb a closed stepladder. Make sure that both sides of the stepladder are locked into place. Ask someone to hold it for you, if possible. Clearly mark and make sure that you can see: Any grab bars or handrails. First and last steps. Where the edge of each step is. Use tools that help you move around (mobility aids) if they are needed. These include: Canes. Walkers. Scooters. Crutches. Turn on the lights when you go into a dark area. Replace any light bulbs as soon as they burn out. Set up your furniture so you have a clear path. Avoid moving your furniture around. If any of your floors are uneven, fix them. If there are any pets around you, be aware of where they are. Review your medicines with your doctor. Some medicines can make you feel dizzy. This can increase your chance of falling. Ask your doctor what other things that you can do to help prevent falls. This information is not intended to replace advice given to you by your health care provider. Make sure you discuss any questions you have with your health care provider. Document Released: 08/14/2009 Document Revised: 03/25/2016 Document Reviewed: 11/22/2014 Elsevier Interactive Patient Education  2017 [employer].

## 2023-07-01 ENCOUNTER — Encounter: Payer: Medicare HMO | Admitting: Family Medicine

## 2023-07-08 NOTE — Patient Instructions (Incomplete)
Preventive Care 42 Years and Older, Female Preventive care refers to lifestyle choices and visits with your health care provider that can promote health and wellness. Preventive care visits are also called wellness exams. What can I expect for my preventive care visit? Counseling Your health care provider may ask you questions about your: Medical history, including: Past medical problems. Family medical history. Pregnancy and menstrual history. History of falls. Current health, including: Memory and ability to understand (cognition). Emotional well-being. Home life and relationship well-being. Sexual activity and sexual health. Lifestyle, including: Alcohol, nicotine or tobacco, and drug use. Access to firearms. Diet, exercise, and sleep habits. Work and work Astronomer. Sunscreen use. Safety issues such as seatbelt and bike helmet use. Physical exam Your health care provider will check your: Height and weight. These may be used to calculate your BMI (body mass index). BMI is a measurement that tells if you are at a healthy weight. Waist circumference. This measures the distance around your waistline. This measurement also tells if you are at a healthy weight and may help predict your risk of certain diseases, such as type 2 diabetes and high blood pressure. Heart rate and blood pressure. Body temperature. Skin for abnormal spots. What immunizations do I need?  Vaccines are usually given at various ages, according to a schedule. Your health care provider will recommend vaccines for you based on your age, medical history, and lifestyle or other factors, such as travel or where you work. What tests do I need? Screening Your health care provider may recommend screening tests for certain conditions. This may include: Lipid and cholesterol levels. Hepatitis C test. Hepatitis B test. HIV (human immunodeficiency virus) test. STI (sexually transmitted infection) testing, if you are at  risk. Lung cancer screening. Colorectal cancer screening. Diabetes screening. This is done by checking your blood sugar (glucose) after you have not eaten for a while (fasting). Mammogram. Talk with your health care provider about how often you should have regular mammograms. BRCA-related cancer screening. This may be done if you have a family history of breast, ovarian, tubal, or peritoneal cancers. Bone density scan. This is done to screen for osteoporosis. Talk with your health care provider about your test results, treatment options, and if necessary, the need for more tests. Follow these instructions at home: Eating and drinking  Eat a diet that includes fresh fruits and vegetables, whole grains, lean protein, and low-fat dairy products. Limit your intake of foods with high amounts of sugar, saturated fats, and salt. Take vitamin and mineral supplements as recommended by your health care provider. Do not drink alcohol if your health care provider tells you not to drink. If you drink alcohol: Limit how much you have to 0-1 drink a day. Know how much alcohol is in your drink. In the [place] one drink equals one 12 oz bottle of beer (355 mL), one 5 oz glass of wine (148 mL), or one 1 oz glass of hard liquor (44 mL). Lifestyle Brush your teeth every morning and night with fluoride toothpaste. Floss one time each day. Exercise for at least 30 minutes 5 or more days each week. Do not use any products that contain nicotine or tobacco. These products include cigarettes, chewing tobacco, and vaping devices, such as e-cigarettes. If you need help quitting, ask your health care provider. Do not use drugs. If you are sexually active, practice safe sex. Use a condom or other form of protection in order to prevent STIs. Take aspirin only as told by  your health care provider. Make sure that you understand how much to take and what form to take. Work with your health care provider to find out whether it  is safe and beneficial for you to take aspirin daily. Ask your health care provider if you need to take a cholesterol-lowering medicine (statin). Find healthy ways to manage stress, such as: Meditation, yoga, or listening to music. Journaling. Talking to a trusted person. Spending time with friends and family. Minimize exposure to UV radiation to reduce your risk of skin cancer. Safety Always wear your seat belt while driving or riding in a vehicle. Do not drive: If you have been drinking alcohol. Do not ride with someone who has been drinking. When you are tired or distracted. While texting. If you have been using any mind-altering substances or drugs. Wear a helmet and other protective equipment during sports activities. If you have firearms in your house, make sure you follow all gun safety procedures. What's next? Visit your health care provider once a year for an annual wellness visit. Ask your health care provider how often you should have your eyes and teeth checked. Stay up to date on all vaccines. This information is not intended to replace advice given to you by your health care provider. Make sure you discuss any questions you have with your health care provider. Document Revised: 04/15/2021 Document Reviewed: 04/15/2021 [employer] Patient Education  2024 [employer].

## 2023-07-11 ENCOUNTER — Ambulatory Visit (INDEPENDENT_AMBULATORY_CARE_PROVIDER_SITE_OTHER): Payer: Medicare HMO | Admitting: Family Medicine

## 2023-07-11 ENCOUNTER — Encounter: Payer: Self-pay | Admitting: Family Medicine

## 2023-07-11 VITALS — BP 138/82 | HR 86 | Temp 98.1°F | Resp 16 | Ht 61.5 in | Wt 217.0 lb

## 2023-07-11 DIAGNOSIS — E559 Vitamin D deficiency, unspecified: Secondary | ICD-10-CM | POA: Diagnosis not present

## 2023-07-11 DIAGNOSIS — Z780 Asymptomatic menopausal state: Secondary | ICD-10-CM | POA: Diagnosis not present

## 2023-07-11 DIAGNOSIS — Z1231 Encounter for screening mammogram for malignant neoplasm of breast: Secondary | ICD-10-CM | POA: Diagnosis not present

## 2023-07-11 DIAGNOSIS — D649 Anemia, unspecified: Secondary | ICD-10-CM

## 2023-07-11 DIAGNOSIS — Z0000 Encounter for general adult medical examination without abnormal findings: Secondary | ICD-10-CM

## 2023-07-11 DIAGNOSIS — Z6841 Body Mass Index (BMI) 40.0 and over, adult: Secondary | ICD-10-CM

## 2023-07-11 NOTE — Progress Notes (Unsigned)
Patient: Danielle Le, Female    DOB: Mar 21, 1956, 67 y.o.   MRN: [id number] Danielle Berry, PA-C Visit Date: 07/11/2023  Today's Provider: [name], PA-C   Chief Complaint  Patient presents with   Annual Exam   Subjective:  Pt last did OV August 2023 She has had surgery/parathyroidectomy and is seeing endo   Annual physical exam:  Danielle Le is a 67 y.o. female who presents today for complete physical exam:  Exercise/Activity:   exercising  Diet/nutrition:    Sleep:    SDOH Screenings   Food Insecurity: No Food Insecurity (02/11/2023)  Housing: Low Risk  (02/11/2023)  Transportation Needs: No Transportation Needs (02/11/2023)  Utilities: Not At Risk (02/11/2023)  Alcohol Screen: Low Risk  (02/11/2023)  Depression (PHQ2-9): Low Risk  (07/11/2023)  Financial Resource Strain: Low Risk  (02/11/2023)  Physical Activity: Insufficiently Active (02/11/2023)  Social Connections: Socially Isolated (02/11/2023)  Stress: No Stress Concern Present (02/11/2023)  Tobacco Use: Low Risk  (07/11/2023)  Health Literacy: Adequate Health Literacy (07/11/2023)     Pt wished  *** discuss acute complaints  *** do routine f/up on chronic conditions today in addition to CPE. Advised pt of separate visit billing/coding  USPSTF grade A and B recommendations - reviewed and addressed today  Depression:  Phq 9 completed today by patient, was reviewed by me with patient in the room PHQ score is ***, pt feels ***    07/11/2023    3:09 PM 02/11/2023    1:10 PM 06/10/2022    1:49 PM 02/02/2022    3:42 PM  PHQ 2/9 Scores  PHQ - 2 Score 0 0 0 0  PHQ- 9 Score 0  0       07/11/2023    3:09 PM 02/11/2023    1:10 PM 06/10/2022    1:49 PM 02/02/2022    3:42 PM 07/29/2021    3:59 PM  Depression screen PHQ 2/9  Decreased Interest 0 0 0 0 0  Down, Depressed, Hopeless 0 0 0 0 0  PHQ - 2 Score 0 0 0 0 0  Altered sleeping 0  0    Tired, decreased energy 0  0    Change in appetite 0  0    Feeling bad or failure  about yourself  0  0    Trouble concentrating 0  0    Moving slowly or fidgety/restless 0  0    Suicidal thoughts 0  0    PHQ-9 Score 0  0    Difficult doing work/chores Not difficult at all  Not difficult at all      Alcohol screening: Flowsheet Row Clinical Support from 02/11/2023 in [facility]  AUDIT-C Score 0       Immunizations and Health Maintenance: Health Maintenance  Topic Date Due   DTaP/Tdap/Td (1 - Tdap) Never done   DEXA SCAN  02/25/2023   INFLUENZA VACCINE  Never done   COVID-19 Vaccine (4 - 2023-24 season) 07/27/2023 (Originally 07/03/2023)   Zoster Vaccines- Shingrix (1 of 2) 10/10/2023 (Originally 11/03/2003)   Pneumonia Vaccine 62+ Years old (1 of 1 - PCV) 07/10/2024 (Originally 11/02/2018)   MAMMOGRAM  07/15/2023   Medicare Annual Wellness (AWV)  02/11/2024   Colonoscopy  01/03/2026   Hepatitis C Screening  Completed   HPV VACCINES  Aged Out     Hep C Screening: done  STD testing and prevention (HIV/chl/gon/syphilis):  see above, no additional testing desired by pt  today  Intimate partner violence:***  Sexual History/Pain during Intercourse: Divorced  Menstrual History/LMP/Abnormal Bleeding: *** No LMP recorded. Patient has had a hysterectomy.  Incontinence Symptoms: some mild incon  Breast cancer: due Last Mammogram: *see HM list above BRCA gene screening: ***  Cervical cancer done/aged otu     Osteoporosis:   Discussion on osteoporosis per age, including high calcium and vitamin D supplementation, weight bearing exercises Pt is *** supplementing with daily calcium/Vit D.  Is on 5000 no calcium recent parathyroid surgery  *** Bone scan/dexa Roughly experienced menopause at age ***  Skin cancer:  Hx of skin CA -  NO*** Discussed atypical lesions   Colorectal cancer:   Colonoscopy is UTD due in 2027 Discussed concerning signs and sx of CRC, pt denies ***  Lung cancer:  n/a Low Dose CT Chest recommended if Age 81-80  years, 20 pack-year currently smoking OR have quit w/in 15years. Patient {DOES NOT does:27190::"does not"} qualify.    Social History   Tobacco Use   Smoking status: Never   Smokeless tobacco: Never  Vaping Use   Vaping status: Never Used  Substance Use Topics   Alcohol use: Not Currently   Drug use: Not Currently     Flowsheet Row Clinical Support from 02/11/2023 in [facility]  AUDIT-C Score 0       Family History  Problem Relation Age of Onset   Hypertension Mother    Hyperlipidemia Mother    Dementia Father    Hypertension Father    AVM Brother    Anxiety disorder Son    Seizures Son    Dementia Paternal Grandfather    Hypertension Brother    Hypercalcemia Neg Hx      Blood pressure/Hypertension: BP Readings from Last 3 Encounters:  07/11/23 138/82  01/24/23 (!) 148/70  12/10/22 (!) 166/87    Weight/Obesity: Wt Readings from Last 3 Encounters:  07/11/23 217 lb (98.4 kg)  02/11/23 199 lb (90.3 kg)  12/10/22 199 lb (90.3 kg)   BMI Readings from Last 3 Encounters:  07/11/23 40.34 kg/m  02/11/23 35.25 kg/m  12/10/22 35.25 kg/m     Lipids:  Lab Results  Component Value Date   CHOL 154 01/13/2022   CHOL 147 10/02/2020   CHOL 272 (H) 02/05/2020   Lab Results  Component Value Date   HDL 59 01/13/2022   HDL 52 10/02/2020   HDL 44 (L) 02/05/2020   Lab Results  Component Value Date   LDLCALC 76 01/13/2022   LDLCALC 72 10/02/2020   LDLCALC 174 (H) 02/05/2020   Lab Results  Component Value Date   TRIG 100 01/13/2022   TRIG 152 (H) 10/02/2020   TRIG 318 (H) 02/05/2020   Lab Results  Component Value Date   CHOLHDL 2.6 01/13/2022   CHOLHDL 2.8 10/02/2020   CHOLHDL 6.2 (H) 02/05/2020   No results found for: "LDLDIRECT" Based on the results of lipid panel his/her cardiovascular risk factor ( using [place] Cohort )  in the next 10 years is: The 10-year ASCVD risk score (Arnett DK, et al., 2019) is: 8.8%   Values used  to calculate the score:     Age: 74 years     Sex: Female     Is Non-Hispanic African American: No     Diabetic: No     Tobacco smoker: No     Systolic Blood Pressure: 138 mmHg     Is BP treated: No     HDL Cholesterol:  59 mg/dL     Total Cholesterol: 154 mg/dL  Glucose:  Glucose  Date Value Ref Range Status  09/03/2022 93 70 - 99 mg/dL Final   Glucose, Bld  Date Value Ref Range Status  12/09/2022 92 70 - 99 mg/dL Final    Comment:    Glucose reference range applies only to samples taken after fasting for at least 8 hours.  06/10/2022 92 65 - 99 mg/dL Final    Comment:    .            Fasting reference interval .   01/13/2022 95 65 - 99 mg/dL Final    Comment:    .            Fasting reference interval .     Advanced Care Planning:  A voluntary discussion about advance care planning including the explanation and discussion of advance directives.   Discussed health care proxy and Living will, and the patient was able to identify a health care proxy as spouse.   Patient {DOES_DOES XBJ:47829} have a living will at present time.   Social History       Social History   Socioeconomic History   Marital status: Divorced    Spouse name: Not on file   Number of children: 1   Years of education: 12   Highest education level: Associate degree: occupational, Scientist, product/process development, or vocational program  Occupational History   Occupation: stay home mom  Tobacco Use   Smoking status: Never   Smokeless tobacco: Never  Vaping Use   Vaping status: Never Used  Substance and Sexual Activity   Alcohol use: Not Currently   Drug use: Not Currently   Sexual activity: Not Currently  Other Topics Concern   Not on file  Social History Narrative   Pt has 69 year old special needs son that lives at home who is non ambulatory/non verbal and she is primary caregiver.    Social Determinants of Health   Financial Resource Strain: Low Risk  (02/11/2023)   Overall Financial Resource Strain  (CARDIA)    Difficulty of Paying Living Expenses: Not very hard  Food Insecurity: No Food Insecurity (02/11/2023)   Hunger Vital Sign    Worried About Running Out of Food in the Last Year: Never true    Ran Out of Food in the Last Year: Never true  Transportation Needs: No Transportation Needs (02/11/2023)   PRAPARE - Administrator, Civil Service (Medical): No    Lack of Transportation (Non-Medical): No  Physical Activity: Insufficiently Active (02/11/2023)   Exercise Vital Sign    Days of Exercise per Week: 3 days    Minutes of Exercise per Session: 30 min  Stress: No Stress Concern Present (02/11/2023)   [employer] of Occupational Health - Occupational Stress Questionnaire    Feeling of Stress : Not at all  Social Connections: Socially Isolated (02/11/2023)   Social Connection and Isolation Panel [NHANES]    Frequency of Communication with Friends and Family: More than three times a week    Frequency of Social Gatherings with Friends and Family: Twice a week    Attends Religious Services: Never    Database administrator or Organizations: No    Attends Banker Meetings: Never    Marital Status: Divorced    Family History        Family History  Problem Relation Age of Onset   Hypertension Mother    Hyperlipidemia Mother  Dementia Father    Hypertension Father    AVM Brother    Anxiety disorder Son    Seizures Son    Dementia Paternal Grandfather    Hypertension Brother    Hypercalcemia Neg Hx     Patient Active Problem List   Diagnosis Date Noted   Gastroesophageal reflux disease 06/17/2022   Osteoporosis without current pathological fracture 05/22/2021   Class 2 severe obesity with serious comorbidity and body mass index (BMI) of 37.0 to 37.9 in adult [facility]) 05/22/2021   Hyperparathyroidism, primary (HCC) 07/22/2020   Hypercalcemia 02/06/2020   Vitamin D deficiency 02/06/2020   Hypertriglyceridemia 02/06/2020   Cardiac murmur 01/05/2019    Iron deficiency anemia 01/05/2019   Hyperlipidemia 01/05/2019   Obesity (BMI 35.0-39.9 without comorbidity) 12/18/2018   Family history of high cholesterol 12/18/2018   Family history of dementia 12/18/2018    Past Surgical History:  Procedure Laterality Date   ABDOMINAL HYSTERECTOMY     CATARACT EXTRACTION W/PHACO Left 07/04/2019   Procedure: CATARACT EXTRACTION PHACO AND INTRAOCULAR LENS PLACEMENT (IOC) left  00:43.2  12.4%  5.40;  Surgeon: Lockie Mola, MD;  Location[facility];  Service: Ophthalmology;  Laterality: Left;   CATARACT EXTRACTION W/PHACO Right 07/25/2019   Procedure: CATARACT EXTRACTION PHACO AND INTRAOCULAR LENS PLACEMENT (IOC) RIGHT  00:41.9  12.4%  5.18;  Surgeon: Lockie Mola, MD;  Location: [facility];  Service: Ophthalmology;  Laterality: Right;   CESAREAN SECTION     COLONOSCOPY WITH PROPOFOL N/A 01/04/2019   Procedure: COLONOSCOPY WITH PROPOFOL;  Surgeon: Toney Reil, MD;  Location: [facility] END[facility];  Service: Gastroenterology;  Laterality: N/A;   ESOPHAGOGASTRODUODENOSCOPY (EGD) WITH PROPOFOL N/A 01/04/2019   Procedure: ESOPHAGOGASTRODUODENOSCOPY (EGD) WITH PROPOFOL;  Surgeon: Toney Reil, MD;  Location: [facility] END[facility];  Service: Gastroenterology;  Laterality: N/A;   JOINT REPLACEMENT Left 03/18/2022   total knee replacement by  Dr. Odis Luster in [place]   PARATHYROIDECTOMY Right 12/10/2022   Procedure: RIGHT INFERIOR PARATHYROIDECTOMY;  Surgeon: Darnell Level, MD;  Location: [facility];  Service: General;  Laterality: Right;     Current Outpatient Medications:    acetaminophen (TYLENOL) 500 MG tablet, Take 1,000 mg by mouth every 8 (eight) hours as needed for moderate pain or mild pain., Disp: , Rfl:    aspirin EC 81 MG tablet, Take 81 mg by mouth at bedtime., Disp: , Rfl:    Cholecalciferol (VITAMIN D) 125 MCG (5000 UT) CAPS, Take 5,000 Units by mouth at bedtime., Disp: , Rfl:    Glycerin-Hypromellose-PEG 400  (ARTIFICIAL TEARS) 0.2-0.2-1 % SOLN, Place 1 drop into both eyes daily as needed (Dry eyes)., Disp: , Rfl:    OMEPRAZOLE PO, Take 1 capsule by mouth at bedtime., Disp: , Rfl:    rosuvastatin (CRESTOR) 10 MG tablet, Take 1 tablet (10 mg total) by mouth daily., Disp: 90 tablet, Rfl: 3   traMADol (ULTRAM) 50 MG tablet, Take 1 tablet (50 mg total) by mouth every 6 (six) hours as needed for moderate pain., Disp: 15 tablet, Rfl: 0   zinc gluconate 50 MG tablet, Take 50 mg by mouth daily., Disp: , Rfl:    ibandronate (BONIVA) 150 MG tablet, TAKE 1 TABLET (150 MG TOTAL) BY MOUTH EVERY 30 (THIRTY) DAYS. TAKE IN THE MORNING WITH A FULL GLASS OF WATER, ON AN EMPTY STOMACH, AND DO NOT TAKE ANYTHING ELSE BY MOUTH OR LIE DOWN FOR THE NEXT 30 MIN. (Patient not taking: Reported on 07/11/2023), Disp: 3 tablet, Rfl: 3  No Known  Allergies  Patient Care Team: Danielle Berry, PA-C as PCP - General (Family Medicine) Romero Belling, MD (Inactive) as Consulting Physician (Endocrinology)   Chart Review: I personally reviewed active problem list, medication list, allergies, family history, social history, health maintenance, notes from last encounter, lab results, imaging with the patient/caregiver today.   Review of Systems  Constitutional: Negative.   HENT: Negative.    Eyes: Negative.   Respiratory: Negative.    Cardiovascular: Negative.   Gastrointestinal: Negative.   Endocrine: Negative.   Genitourinary: Negative.   Musculoskeletal: Negative.   Skin: Negative.   Allergic/Immunologic: Negative.   Neurological: Negative.   Hematological: Negative.   Psychiatric/Behavioral: Negative.    All other systems reviewed and are negative.         Objective:   Vitals:  Vitals:   07/11/23 1512  BP: 138/82  Pulse: 86  Resp: 16  Temp: 98.1 F (36.7 C)  TempSrc: Oral  SpO2: 95%  Weight: 217 lb (98.4 kg)  Height: 5' 1.5" (1.562 m)    Body mass index is 40.34 kg/m.  Physical Exam Vitals and nursing note  reviewed.  Constitutional:      General: She is not in acute distress.    Appearance: Normal appearance. She is well-developed. She is obese. She is not ill-appearing, toxic-appearing or diaphoretic.     Interventions: Face mask in place.  HENT:     Head: Normocephalic and atraumatic.     Right Ear: External ear normal.     Left Ear: External ear normal.  Eyes:     General: Lids are normal. No scleral icterus.       Right eye: No discharge.        Left eye: No discharge.     Conjunctiva/sclera: Conjunctivae normal.  Neck:     Trachea: Phonation normal. No tracheal deviation.  Cardiovascular:     Rate and Rhythm: Normal rate and regular rhythm.     Pulses: Normal pulses.          Radial pulses are 2+ on the right side and 2+ on the left side.       Posterior tibial pulses are 2+ on the right side and 2+ on the left side.     Heart sounds: Normal heart sounds. No murmur heard.    No friction rub. No gallop.  Pulmonary:     Effort: Pulmonary effort is normal. No respiratory distress.     Breath sounds: Normal breath sounds. No stridor. No wheezing, rhonchi or rales.  Chest:     Chest wall: No tenderness.  Abdominal:     General: Bowel sounds are normal. There is no distension.     Palpations: Abdomen is soft.  Musculoskeletal:     Right lower leg: Edema present.     Left lower leg: Edema present.  Skin:    General: Skin is warm and dry.     Coloration: Skin is not jaundiced or pale.     Findings: No rash.  Neurological:     Mental Status: She is alert.     Motor: No abnormal muscle tone.     Gait: Gait normal.  Psychiatric:        Mood and Affect: Mood normal.        Speech: Speech normal.        Behavior: Behavior normal.       Fall Risk:    07/11/2023    3:02 PM 02/11/2023    1:04 PM 06/10/2022  1:49 PM 02/02/2022    3:44 PM 07/29/2021    3:36 PM  Fall Risk   Falls in the past year? 0 0 0 0 0  Number falls in past yr: 0 0 0 0 0  Injury with Fall? 0 0 0 0    Risk for fall due to : No Fall Risks No Fall Risks No Fall Risks No Fall Risks   Follow up Falls prevention discussed;Education provided;Falls evaluation completed Education provided;Falls prevention discussed Falls prevention discussed;Education provided Falls prevention discussed Falls prevention discussed    Functional Status Survey: Is the patient deaf or have difficulty hearing?: No Does the patient have difficulty seeing, even when wearing glasses/contacts?: No Does the patient have difficulty concentrating, remembering, or making decisions?: No Does the patient have difficulty walking or climbing stairs?: No Does the patient have difficulty dressing or bathing?: No Does the patient have difficulty doing errands alone such as visiting a doctor's office or shopping?: No   Assessment & Plan:    CPE completed today  USPSTF grade A and B recommendations reviewed with patient; age-appropriate recommendations, preventive care, screening tests, etc discussed and encouraged; healthy living encouraged; see AVS for patient education given to patient  Discussed importance of 150 minutes of physical activity weekly, AHA exercise recommendations given to pt in AVS/handout  Discussed importance of healthy diet:  eating lean meats and proteins, avoiding trans fats and saturated fats, avoid simple sugars and excessive carbs in diet, eat 6 servings of fruit/vegetables daily and drink plenty of water and avoid sweet beverages.    Recommended pt to do annual eye exam and routine dental exams/cleanings  Depression, alcohol, fall screening completed as documented above and per flowsheets  Advance Care planning information and packet discussed and offered today, encouraged pt to discuss with family members/spouse/partner/friends and complete Advanced directive packet and bring copy to office   Reviewed Health Maintenance: Health Maintenance  Topic Date Due   DTaP/Tdap/Td (1 - Tdap) Never done    DEXA SCAN  02/25/2023   INFLUENZA VACCINE  Never done   COVID-19 Vaccine (4 - 2023-24 season) 07/27/2023 (Originally 07/03/2023)   Zoster Vaccines- Shingrix (1 of 2) 10/10/2023 (Originally 11/03/2003)   Pneumonia Vaccine 48+ Years old (1 of 1 - PCV) 07/10/2024 (Originally 11/02/2018)   MAMMOGRAM  07/15/2023   Medicare Annual Wellness (AWV)  02/11/2024   Colonoscopy  01/03/2026   Hepatitis C Screening  Completed   HPV VACCINES  Aged Out    Immunizations: Immunization History  Administered Date(s) Administered   [employer] Vaccination 01/11/2020, 02/06/2020, 09/08/2020   Vaccines:  HPV: up to at age 53 , ask insurance if age between 13-45  Shingrix: 48-64 yo and ask insurance if covered when patient above 65 yo Pneumonia: *** educated and discussed with patient. Flu: *** educated and discussed with patient. COVID:      ICD-10-CM   1. Annual physical exam  Z00.00 DG Bone Density    MM 3D SCREENING MAMMOGRAM BILATERAL BREAST    CBC with Differential/Platelet    COMPLETE METABOLIC PANEL WITH GFR    Lipid panel    Parathyroid hormone, intact (no Ca)    VITAMIN D 25 Hydroxy (Vit-D Deficiency, Fractures)    TSH    CANCELED: COMPLETE METABOLIC PANEL WITH GFR    CANCELED: CBC with Differential/Platelet    CANCELED: Lipid panel    CANCELED: VITAMIN D 25 Hydroxy (Vit-D Deficiency, Fractures)    CANCELED: Parathyroid hormone, intact (no Ca)    2.  Encounter for screening mammogram for malignant neoplasm of breast  Z12.31 MM 3D SCREENING MAMMOGRAM BILATERAL BREAST    3. Postmenopausal estrogen deficiency  Z78.0 DG Bone Density    4. Vitamin D deficiency  E55.9 COMPLETE METABOLIC PANEL WITH GFR    Parathyroid hormone, intact (no Ca)    VITAMIN D 25 Hydroxy (Vit-D Deficiency, Fractures)    CANCELED: COMPLETE METABOLIC PANEL WITH GFR    CANCELED: VITAMIN D 25 Hydroxy (Vit-D Deficiency, Fractures)    CANCELED: Parathyroid hormone, intact (no Ca)    5. Class 3 severe obesity with  body mass index (BMI) of 40.0 to 44.9 in adult, unspecified obesity type, unspecified whether serious comorbidity present (HCC)  E66.01 VITAMIN D 25 Hydroxy (Vit-D Deficiency, Fractures)   Z68.41 CANCELED: VITAMIN D 25 Hydroxy (Vit-D Deficiency, Fractures)          Danielle Berry, PA-C 07/11/23 3:17 PM  [facility]

## 2023-07-12 ENCOUNTER — Encounter: Payer: Self-pay | Admitting: Family Medicine

## 2023-07-12 ENCOUNTER — Other Ambulatory Visit: Payer: Self-pay | Admitting: Family Medicine

## 2023-07-12 DIAGNOSIS — E785 Hyperlipidemia, unspecified: Secondary | ICD-10-CM

## 2023-07-12 MED ORDER — ROSUVASTATIN CALCIUM 10 MG PO TABS: 10.0000 mg | ORAL_TABLET | Freq: Every day | ORAL | 3 refills | Status: DC

## 2023-07-13 LAB — COMPLETE METABOLIC PANEL WITH GFR
AG Ratio: 1.8 (calc) (ref 1.0–2.5)
ALT: 15 U/L (ref 6–29)
AST: 21 U/L (ref 10–35)
Albumin: 4.5 g/dL (ref 3.6–5.1)
Alkaline phosphatase (APISO): 80 U/L (ref 37–153)
BUN: 16 mg/dL (ref 7–25)
CO2: 28 mmol/L (ref 20–32)
Calcium: 10.3 mg/dL (ref 8.6–10.4)
Chloride: 103 mmol/L (ref 98–110)
Creat: 0.59 mg/dL (ref 0.50–1.05)
Globulin: 2.5 g/dL (ref 1.9–3.7)
Glucose, Bld: 93 mg/dL (ref 65–99)
Potassium: 4.3 mmol/L (ref 3.5–5.3)
Sodium: 139 mmol/L (ref 135–146)
Total Bilirubin: 0.5 mg/dL (ref 0.2–1.2)
Total Protein: 7 g/dL (ref 6.1–8.1)
eGFR: 97 mL/min/{1.73_m2} (ref >=60)

## 2023-07-13 LAB — CBC WITH DIFFERENTIAL/PLATELET
Absolute Monocytes: 410 {cells}/uL (ref 200–950)
Basophils Absolute: 32 {cells}/uL (ref 0–200)
Basophils Relative: 0.7 %
Eosinophils Absolute: 171 {cells}/uL (ref 15–500)
Eosinophils Relative: 3.8 %
HCT: 35.5 % (ref 35.0–45.0)
Hemoglobin: 10.9 g/dL — ABNORMAL LOW (ref 11.7–15.5)
Lymphs Abs: 1670 {cells}/uL (ref 850–3900)
MCH: 22.4 pg — ABNORMAL LOW (ref 27.0–33.0)
MCHC: 30.7 g/dL — ABNORMAL LOW (ref 32.0–36.0)
MCV: 72.9 fL — ABNORMAL LOW (ref 80.0–100.0)
MPV: 10.9 fL (ref 7.5–12.5)
Monocytes Relative: 9.1 %
Neutro Abs: 2219 {cells}/uL (ref 1500–7800)
Neutrophils Relative %: 49.3 %
Platelets: 290 10*3/uL (ref 140–400)
RBC: 4.87 10*6/uL (ref 3.80–5.10)
RDW: 15.1 % — ABNORMAL HIGH (ref 11.0–15.0)
Total Lymphocyte: 37.1 %
WBC: 4.5 10*3/uL (ref 3.8–10.8)

## 2023-07-13 LAB — VITAMIN B12: Vitamin B-12: 429 pg/mL (ref 200–1100)

## 2023-07-13 LAB — VITAMIN D 25 HYDROXY (VIT D DEFICIENCY, FRACTURES): Vit D, 25-Hydroxy: 53 ng/mL (ref 30–100)

## 2023-07-13 LAB — IRON,TIBC AND FERRITIN PANEL
%SAT: 7 % — ABNORMAL LOW (ref 16–45)
Ferritin: 5 ng/mL — ABNORMAL LOW (ref 16–288)
Iron: 34 ug/dL — ABNORMAL LOW (ref 45–160)
TIBC: 473 ug/dL — ABNORMAL HIGH (ref 250–450)

## 2023-07-13 LAB — LIPID PANEL
Cholesterol: 147 mg/dL (ref <200)
HDL: 52 mg/dL (ref >=50)
LDL Cholesterol (Calc): 72 mg/dL
Non-HDL Cholesterol (Calc): 95 mg/dL (ref <130)
Total CHOL/HDL Ratio: 2.8 (calc) (ref <5.0)
Triglycerides: 156 mg/dL — ABNORMAL HIGH (ref <150)

## 2023-07-13 LAB — TSH: TSH: 1.39 m[IU]/L (ref 0.40–4.50)

## 2023-07-13 LAB — PARATHYROID HORMONE, INTACT (NO CA): PTH: 80 pg/mL — ABNORMAL HIGH (ref 16–77)

## 2023-08-03 ENCOUNTER — Encounter: Payer: Self-pay | Admitting: Nurse Practitioner

## 2023-08-03 ENCOUNTER — Ambulatory Visit (INDEPENDENT_AMBULATORY_CARE_PROVIDER_SITE_OTHER): Payer: Medicare HMO | Admitting: Nurse Practitioner

## 2023-08-03 VITALS — BP 122/78 | HR 95 | Temp 98.0°F | Resp 18 | Ht 62.0 in | Wt 217.9 lb

## 2023-08-03 DIAGNOSIS — R5383 Other fatigue: Secondary | ICD-10-CM | POA: Diagnosis not present

## 2023-08-03 DIAGNOSIS — D508 Other iron deficiency anemias: Secondary | ICD-10-CM

## 2023-08-03 DIAGNOSIS — R600 Localized edema: Secondary | ICD-10-CM | POA: Diagnosis not present

## 2023-08-03 MED ORDER — IRON (FERROUS SULFATE) 325 (65 FE) MG PO TABS: 325.0000 mg | ORAL_TABLET | Freq: Every day | ORAL | 0 refills | Status: AC

## 2023-08-03 NOTE — Assessment & Plan Note (Signed)
Start iron supplement, bring in stool cards, follow up in 3 months for recheck

## 2023-08-03 NOTE — Progress Notes (Signed)
BP 122/78   Pulse 95   Temp 98 F (36.7 C)   Resp 18   Ht 5\' 2"  (1.575 m)   Wt 217 lb 14.4 oz (98.8 kg)   SpO2 98%   BMI 39.85 kg/m    Subjective:    Patient ID: Danielle Le, female    DOB: [birth year], 67 y.o.   MRN: [id number]  HPI: Danielle Le is a 67 y.o. female  Chief Complaint  Patient presents with   Fatigue   Fatigue/ lower extremity edema: patient was recently seen on 07/11/2023 and brought up these concerns at her physical. Labs were done which showed worsening iron deficiency anemia. She was previously on iron supplement. Patient was advised to do stool cards but she has not done them yet. Her last colon cancer screening was 01/04/2019.  Patient denies noticing any  blood in her stool.  Will start patient on iron supplement. She has also started b12 supplement. She also reports that she has had lower extremity edema off and on for a couple years. She says that it has really started to bother her.  edema noted on exam on 07/11/2023 per note.  She reports after her appointment on 07/11/2023 she has been using compression socks and that has really helped.  Will continue to use compression socks at this time.   Patient to come back in 3 months for follow up.  Will repeat labs at that time. Patient is going to do stool cards and bring them in.      Latest Ref Rng & Units 07/11/2023    3:59 PM 12/09/2022    2:42 PM 01/13/2022    2:51 PM  CBC  WBC 3.8 - 10.8 Thousand/uL 4.5  5.6  4.9   Hemoglobin 11.7 - 15.5 g/dL 24.4  01.0  27.2   Hematocrit 35.0 - 45.0 % 35.5  36.8  40.5   Platelets 140 - 400 Thousand/uL 290  270  241    Iron/TIBC/Ferritin/ %Sat    Component Value Date/Time   IRON 34 (L) 07/11/2023 1559   IRON 127 11/06/2019 1529   TIBC 473 (H) 07/11/2023 1559   TIBC 377 11/06/2019 1529   FERRITIN 5 (L) 07/11/2023 1559   FERRITIN 39 11/06/2019 1529   IRONPCTSAT 7 (L) 07/11/2023 1559    Relevant past medical, surgical, family and social history reviewed and updated as  indicated. Interim medical history since our last visit reviewed. Allergies and medications reviewed and updated.  Review of Systems  Ten systems reviewed and is negative except as mentioned in HPI       Objective:    BP 122/78   Pulse 95   Temp 98 F (36.7 C)   Resp 18   Ht 5\' 2"  (1.575 m)   Wt 217 lb 14.4 oz (98.8 kg)   SpO2 98%   BMI 39.85 kg/m   Wt Readings from Last 3 Encounters:  08/03/23 217 lb 14.4 oz (98.8 kg)  07/11/23 217 lb (98.4 kg)  02/11/23 199 lb (90.3 kg)    Physical Exam  Constitutional: Patient appears well-developed and well-nourished. Obese  No distress.  HEENT: head atraumatic, normocephalic, pupils equal and reactive to light, neck supple, throat within normal limits Cardiovascular: Normal rate, regular rhythm and normal heart sounds.  No murmur heard. No BLE edema. Pulmonary/Chest: Effort normal and breath sounds normal. No respiratory distress. Abdominal: Soft.  There is no tenderness. Psychiatric: Patient has a normal mood and affect. behavior is normal. Judgment  and thought content normal.  Results for orders placed or performed in visit on 07/11/23  CBC with Differential/Platelet  Result Value Ref Range   WBC 4.5 3.8 - 10.8 Thousand/uL   RBC 4.87 3.80 - 5.10 Million/uL   Hemoglobin 10.9 (L) 11.7 - 15.5 g/dL   HCT 60.4 54.0 - 98.1 %   MCV 72.9 (L) 80.0 - 100.0 fL   MCH 22.4 (L) 27.0 - 33.0 pg   MCHC 30.7 (L) 32.0 - 36.0 g/dL   RDW 19.1 (H) 47.8 - 29.5 %   Platelets 290 140 - 400 Thousand/uL   MPV 10.9 7.5 - 12.5 fL   Neutro Abs 2,219 1,500 - 7,800 cells/uL   Lymphs Abs 1,670 850 - 3,900 cells/uL   Absolute Monocytes 410 200 - 950 cells/uL   Eosinophils Absolute 171 15 - 500 cells/uL   Basophils Absolute 32 0 - 200 cells/uL   Neutrophils Relative % 49.3 %   Total Lymphocyte 37.1 %   Monocytes Relative 9.1 %   Eosinophils Relative 3.8 %   Basophils Relative 0.7 %  COMPLETE METABOLIC PANEL WITH GFR  Result Value Ref Range   Glucose,  Bld 93 65 - 99 mg/dL   BUN 16 7 - 25 mg/dL   Creat 6.21 3.08 - 6.57 mg/dL   eGFR 97 > OR = 60 QI/ONG/2.95M8   BUN/Creatinine Ratio SEE NOTE: 6 - 22 (calc)   Sodium 139 135 - 146 mmol/L   Potassium 4.3 3.5 - 5.3 mmol/L   Chloride 103 98 - 110 mmol/L   CO2 28 20 - 32 mmol/L   Calcium 10.3 8.6 - 10.4 mg/dL   Total Protein 7.0 6.1 - 8.1 g/dL   Albumin 4.5 3.6 - 5.1 g/dL   Globulin 2.5 1.9 - 3.7 g/dL (calc)   AG Ratio 1.8 1.0 - 2.5 (calc)   Total Bilirubin 0.5 0.2 - 1.2 mg/dL   Alkaline phosphatase (APISO) 80 37 - 153 U/L   AST 21 10 - 35 U/L   ALT 15 6 - 29 U/L  Lipid panel  Result Value Ref Range   Cholesterol 147 <200 mg/dL   HDL 52 > OR = 50 mg/dL   Triglycerides 413 (H) <150 mg/dL   LDL Cholesterol (Calc) 72 mg/dL (calc)   Total CHOL/HDL Ratio 2.8 <5.0 (calc)   Non-HDL Cholesterol (Calc) 95 <244 mg/dL (calc)  Parathyroid hormone, intact (no Ca)  Result Value Ref Range   PTH 80 (H) 16 - 77 pg/mL  VITAMIN D 25 Hydroxy (Vit-D Deficiency, Fractures)  Result Value Ref Range   Vit D, 25-Hydroxy 53 30 - 100 ng/mL  TSH  Result Value Ref Range   TSH 1.39 0.40 - 4.50 mIU/L  Iron, TIBC and Ferritin Panel  Result Value Ref Range   Iron 34 (L) 45 - 160 mcg/dL   TIBC 010 (H) 272 - 536 mcg/dL (calc)   %SAT 7 (L) 16 - 45 % (calc)   Ferritin 5 (L) 16 - 288 ng/mL  Vitamin B12  Result Value Ref Range   Vitamin B-12 429 200 - 1,100 pg/mL      Assessment & Plan:   Problem List Items Addressed This Visit       Other   Iron deficiency anemia    Start iron supplement, bring in stool cards, follow up in 3 months for recheck      Relevant Medications   Iron, Ferrous Sulfate, 325 (65 Fe) MG TABS   Other Visit Diagnoses  Other fatigue    -  Primary   likely due to iron deficinecy anemia,  turn in stool cards, start iron supplement, follow up in 3 months to repeat labs.   Relevant Medications   Iron, Ferrous Sulfate, 325 (65 Fe) MG TABS   Lower extremity edema       improving  with compression socks.        Follow up plan: No follow-ups on file.

## 2023-10-03 ENCOUNTER — Other Ambulatory Visit: Payer: Medicare HMO

## 2023-10-05 ENCOUNTER — Ambulatory Visit
Admission: RE | Admit: 2023-10-05 | Discharge: 2023-10-05 | Disposition: A | Discharge status: Home / Self Care | Payer: Medicare HMO | Source: Ambulatory Visit | Attending: Family Medicine | Admitting: Family Medicine

## 2023-10-05 DIAGNOSIS — Z1231 Encounter for screening mammogram for malignant neoplasm of breast: Secondary | ICD-10-CM | POA: Diagnosis not present

## 2023-10-05 DIAGNOSIS — Z0000 Encounter for general adult medical examination without abnormal findings: Secondary | ICD-10-CM

## 2023-10-05 DIAGNOSIS — Z780 Asymptomatic menopausal state: Secondary | ICD-10-CM | POA: Insufficient documentation

## 2023-10-05 DIAGNOSIS — M810 Age-related osteoporosis without current pathological fracture: Secondary | ICD-10-CM | POA: Diagnosis not present

## 2023-10-30 ENCOUNTER — Ambulatory Visit
Admission: EM | Admit: 2023-10-30 | Discharge: 2023-10-30 | Disposition: A | Discharge status: Home / Self Care | Payer: Medicare HMO | Attending: Emergency Medicine | Admitting: Emergency Medicine

## 2023-10-30 DIAGNOSIS — J069 Acute upper respiratory infection, unspecified: Secondary | ICD-10-CM

## 2023-10-30 DIAGNOSIS — H6692 Otitis media, unspecified, left ear: Secondary | ICD-10-CM | POA: Diagnosis not present

## 2023-10-30 MED ORDER — AMOXICILLIN-POT CLAVULANATE 875-125 MG PO TABS: 1.0000 | ORAL_TABLET | Freq: Two times a day (BID) | ORAL | 0 refills | Status: DC

## 2023-10-30 NOTE — ED Provider Notes (Signed)
[name]    CSN: [id number] Arrival date & time: 10/30/23  1427      History   Chief Complaint Chief Complaint  Patient presents with   Otalgia   Cough   Headache    HPI Danielle Le is a 67 y.o. female.  Patient presents with 4-day history of earache, sore throat, cough, headache.  Treating symptoms with Tylenol and Robitussin.  No fever or shortness of breath.  Her medical history includes hypertension.  The history is provided by the patient and medical records.    Past Medical History:  Diagnosis Date   Anemia    Arthritis    Cataract    GERD (gastroesophageal reflux disease)    Heart murmur    Hypertension     Patient Active Problem List   Diagnosis Date Noted   Gastroesophageal reflux disease 06/17/2022   Osteoporosis without current pathological fracture 05/22/2021   Class 2 severe obesity with serious comorbidity and body mass index (BMI) of 37.0 to 37.9 in adult [facility]) 05/22/2021   Hyperparathyroidism, primary (HCC) 07/22/2020   Hypercalcemia 02/06/2020   Vitamin D deficiency 02/06/2020   Hypertriglyceridemia 02/06/2020   Cardiac murmur 01/05/2019   Iron deficiency anemia 01/05/2019   Hyperlipidemia 01/05/2019   Obesity (BMI 35.0-39.9 without comorbidity) 12/18/2018   Family history of high cholesterol 12/18/2018   Family history of dementia 12/18/2018    Past Surgical History:  Procedure Laterality Date   ABDOMINAL HYSTERECTOMY     CATARACT EXTRACTION W/PHACO Left 07/04/2019   Procedure: CATARACT EXTRACTION PHACO AND INTRAOCULAR LENS PLACEMENT (IOC) left  00:43.2  12.4%  5.40;  Surgeon: Lockie Mola, MD;  Location: [facility];  Service: Ophthalmology;  Laterality: Left;   CATARACT EXTRACTION W/PHACO Right 07/25/2019   Procedure: CATARACT EXTRACTION PHACO AND INTRAOCULAR LENS PLACEMENT (IOC) RIGHT  00:41.9  12.4%  5.18;  Surgeon: Lockie Mola, MD;  Location: [facility];  Service: Ophthalmology;   Laterality: Right;   CESAREAN SECTION     COLONOSCOPY WITH PROPOFOL N/A 01/04/2019   Procedure: COLONOSCOPY WITH PROPOFOL;  Surgeon: Toney Reil, MD;  Location: [facility];  Service: Gastroenterology;  Laterality: N/A;   ESOPHAGOGASTRODUODENOSCOPY (EGD) WITH PROPOFOL N/A 01/04/2019   Procedure: ESOPHAGOGASTRODUODENOSCOPY (EGD) WITH PROPOFOL;  Surgeon: Toney Reil, MD;  Location: [facility];  Service: Gastroenterology;  Laterality: N/A;   JOINT REPLACEMENT Left 03/18/2022   total knee replacement by  Dr. Odis Luster in [place]   PARATHYROIDECTOMY Right 12/10/2022   Procedure: RIGHT INFERIOR PARATHYROIDECTOMY;  Surgeon: Darnell Level, MD;  Location: [facility];  Service: General;  Laterality: Right;    OB History   No obstetric history on file.      Home Medications    Prior to Admission medications   Medication Sig Start Date End Date Taking? Authorizing Provider  amoxicillin-clavulanate (AUGMENTIN) 875-125 MG tablet Take 1 tablet by mouth every 12 (twelve) hours. 10/30/23  Yes Mickie Bail, NP  acetaminophen (TYLENOL) 500 MG tablet Take 1,000 mg by mouth every 8 (eight) hours as needed for moderate pain or mild pain.    [provider]  aspirin EC 81 MG tablet Take 81 mg by mouth at bedtime.    [provider]  Cholecalciferol (VITAMIN D) 125 MCG (5000 UT) CAPS Take 5,000 Units by mouth at bedtime.    [provider]  Glycerin-Hypromellose-PEG 400 (ARTIFICIAL TEARS) 0.2-0.2-1 % SOLN Place 1 drop into both eyes daily as needed (Dry eyes).    [provider]  Iron, Ferrous Sulfate, 325 (65 Fe) MG TABS Take 325 mg by mouth daily. 08/03/23   Berniece Salines, FNP  OMEPRAZOLE PO Take 1 capsule by mouth at bedtime.    [provider]  rosuvastatin (CRESTOR) 10 MG tablet Take 1 tablet (10 mg total) by mouth daily. 07/12/23   [name], PA-C  traMADol (ULTRAM) 50 MG tablet Take 1 tablet (50 mg total) by mouth every 6 (six) hours as  needed for moderate pain. 12/10/22   Darnell Level, MD  zinc gluconate 50 MG tablet Take 50 mg by mouth daily.    [provider]    Family History Family History  Problem Relation Age of Onset   Hypertension Mother    Hyperlipidemia Mother    Dementia Father    Hypertension Father    AVM Brother    Anxiety disorder Son    Seizures Son    Dementia Paternal Grandfather    Hypertension Brother    Hypercalcemia Neg Hx     Social History Social History   Tobacco Use   Smoking status: Never   Smokeless tobacco: Never  Vaping Use   Vaping status: Never Used  Substance Use Topics   Alcohol use: Not Currently   Drug use: Not Currently     Allergies   Patient has no known allergies.   Review of Systems Review of Systems  Constitutional:  Negative for chills and fever.  HENT:  Positive for congestion and ear pain. Negative for ear discharge and sore throat.   Respiratory:  Positive for cough. Negative for shortness of breath.   Neurological:  Positive for headaches.     Physical Exam Triage Vital Signs ED Triage Vitals  Encounter Vitals Group     BP 10/30/23 1514 134/83     Systolic BP Percentile --      Diastolic BP Percentile --      Pulse Rate 10/30/23 1506 88     Resp 10/30/23 1506 18     Temp 10/30/23 1506 99.4 F (37.4 C)     Temp src --      SpO2 10/30/23 1506 95 %     Weight --      Height --      Head Circumference --      Peak Flow --      Pain Score 10/30/23 1511 4     Pain Loc --      Pain Education --      Exclude from Growth Chart --    No data found.  Updated Vital Signs BP 134/83   Pulse 88   Temp 99.4 F (37.4 C)   Resp 18   SpO2 95%   Visual Acuity Right Eye Distance:   Left Eye Distance:   Bilateral Distance:    Right Eye Near:   Left Eye Near:    Bilateral Near:     Physical Exam Constitutional:      General: She is not in acute distress. HENT:     Right Ear: Tympanic membrane normal.     Left Ear: Tympanic  membrane is erythematous and bulging.     Ears:      Nose: Congestion and rhinorrhea present.     Mouth/Throat:     Mouth: Mucous membranes are moist.     Pharynx: Oropharynx is clear.  Cardiovascular:     Rate and Rhythm: Normal rate and regular rhythm.     Heart sounds: Normal heart sounds.  Pulmonary:  Effort: Pulmonary effort is normal. No respiratory distress.     Breath sounds: Normal breath sounds.  Skin:    General: Skin is warm and dry.  Neurological:     Mental Status: She is alert.      UC Treatments / Results  Labs (all labs ordered are listed, but only abnormal results are displayed) Labs Reviewed - No data to display  EKG   Radiology No results found.  Procedures Procedures (including critical care time)  Medications Ordered in UC Medications - No data to display  Initial Impression / Assessment and Plan / UC Course  I have reviewed the triage vital signs and the nursing notes.  Pertinent labs & imaging results that were available during my care of the patient were reviewed by me and considered in my medical decision making (see chart for details).    Left otitis media, acute upper respiratory infection.  Patient's left TM is erythematous and has a small circular area of bulging at the right base of the TM.  It has not ruptured at this time.  Treating with Augmentin.  Tylenol or ibuprofen as needed for discomfort.  Plain Mucinex as needed for congestion.  Instructed patient to follow-up with her PCP tomorrow.  Education provided on otitis media and URI.  She agrees to plan of care.  Final Clinical Impressions(s) / UC Diagnoses   Final diagnoses:  Left otitis media, unspecified otitis media type  Acute upper respiratory infection     Discharge Instructions      Take the Augmentin as directed.  Follow up with your primary care provider.        ED Prescriptions     Medication Sig Dispense Auth. Provider   amoxicillin-clavulanate  (AUGMENTIN) 875-125 MG tablet Take 1 tablet by mouth every 12 (twelve) hours. 14 tablet Mickie Bail, NP      PDMP not reviewed this encounter.   Mickie Bail, NP 10/30/23 [phone]

## 2023-10-30 NOTE — Discharge Instructions (Addendum)
Take the Augmentin as directed.  Follow up with your primary care provider.    

## 2023-10-30 NOTE — ED Triage Notes (Addendum)
Patient to Urgent Care with complaints of sore throat/ cough/ bilateral ear pain/ headaches. Denies any known fevers.  Symptoms started 12/25. Denies any sick contacts.  Meds: Robitussin w/ honey, tylenol.

## 2023-10-31 ENCOUNTER — Telehealth: Payer: Self-pay | Admitting: Family Medicine

## 2023-10-31 NOTE — Telephone Encounter (Signed)
Copied from CRM [id number]. Topic: Referral - Request for Referral >> Oct 31, 2023 10:48 AM Patsy Lager T wrote: Did the patient discuss referral with their provider in the last year? No (If No - schedule appointment) (If Yes - send message)  Appointment offered? Yes  Type of order/referral and detailed reason for visit: ENT, left ear infection with possible eardrum burst  Preference of office, provider, location: unknown  If referral order, have you been seen by this specialty before? No (If Yes, this issue or another issue? When? Where?  Can we respond through MyChart? No

## 2023-11-01 ENCOUNTER — Encounter: Payer: Self-pay | Admitting: Physician Assistant

## 2023-11-01 ENCOUNTER — Ambulatory Visit (INDEPENDENT_AMBULATORY_CARE_PROVIDER_SITE_OTHER): Payer: Medicare HMO | Admitting: Physician Assistant

## 2023-11-01 VITALS — BP 132/76 | HR 91 | Resp 16 | Ht 62.0 in

## 2023-11-01 DIAGNOSIS — H66002 Acute suppurative otitis media without spontaneous rupture of ear drum, left ear: Secondary | ICD-10-CM | POA: Diagnosis not present

## 2023-11-01 DIAGNOSIS — R051 Acute cough: Secondary | ICD-10-CM

## 2023-11-01 MED ORDER — HYDROCOD POLI-CHLORPHE POLI ER 10-8 MG/5ML PO SUER: 5.0000 mL | Freq: Every evening | ORAL | 0 refills | Status: DC | PRN

## 2023-11-01 NOTE — Progress Notes (Signed)
 Acute Office Visit   Patient: Danielle Le   DOB: 1956/09/15   67 y.o. Female  MRN: [id number] Visit Date: 11/01/2023  Today's healthcare provider: Rocky BRAVO Lanise Mergen, PA-C  Introduced myself to the patient as a PA-C and provided education on APPs in clinical practice.    Chief Complaint  Patient presents with   Riley Sous to urgent care and got medication. Feeling better, but still having fullness. Would like ears checked.   Subjective    HPI HPI     Otalgia    Additional comments: Went to urgent care and got medication. Feeling better, but still having fullness. Would like ears checked.      Last edited by Dann Kirsch, CMA on 11/01/2023 11:10 AM.      Ear infection  Patient was seen at [facility] on 10/30/23 and provided Augmentin  for left otitis media  She reports prior to her ear symptoms she was having voice hoarseness and sore throat She states she has been taking Augmentin  as directed and her left ear is feeling a bit better now   She is also concerned for ongoing coughing - she reports it is worse at night and keeps her and others in home awake  She is using OTC cough syrup without much relief    Medications: Outpatient Medications Prior to Visit  Medication Sig   acetaminophen  (TYLENOL ) 500 MG tablet Take 1,000 mg by mouth every 8 (eight) hours as needed for moderate pain or mild pain.   amoxicillin -clavulanate (AUGMENTIN ) 875-125 MG tablet Take 1 tablet by mouth every 12 (twelve) hours.   aspirin  EC 81 MG tablet Take 81 mg by mouth at bedtime.   Cholecalciferol (VITAMIN D ) 125 MCG (5000 UT) CAPS Take 5,000 Units by mouth at bedtime.   Glycerin-Hypromellose-PEG 400 (ARTIFICIAL TEARS) 0.2-0.2-1 % SOLN Place 1 drop into both eyes daily as needed (Dry eyes).   Iron , Ferrous Sulfate , 325 (65 Fe) MG TABS Take 325 mg by mouth daily.   OMEPRAZOLE PO Take 1 capsule by mouth at bedtime.   rosuvastatin  (CRESTOR ) 10 MG tablet Take 1 tablet (10 mg total) by mouth  daily.   traMADol  (ULTRAM ) 50 MG tablet Take 1 tablet (50 mg total) by mouth every 6 (six) hours as needed for moderate pain.   zinc gluconate 50 MG tablet Take 50 mg by mouth daily.   No facility-administered medications prior to visit.    Review of Systems  HENT:  Positive for ear pain, sore throat and voice change.        Ear fullness    Respiratory:  Positive for cough.         Objective    BP 132/76   Pulse 91   Resp 16   Ht 5' 2 (1.575 m)   SpO2 98%   BMI 39.85 kg/m     Physical Exam Vitals reviewed.  Constitutional:      General: She is awake.     Appearance: Normal appearance. She is well-developed and well-groomed.  HENT:     Head: Normocephalic and atraumatic.     Right Ear: Hearing, tympanic membrane and ear canal normal.     Left Ear: Hearing and ear canal normal. Tympanic membrane is injected, erythematous and bulging.     Ears:     Comments: Mildly erythematous TM with some bulging- does not appear to be purulent or perforated at this time Neurological:  Mental Status: She is alert.  Psychiatric:        Behavior: Behavior is cooperative.       No results found for any visits on 11/01/23.  Assessment & Plan      No follow-ups on file.      Problem List Items Addressed This Visit   None Visit Diagnoses       Acute suppurative otitis media of left ear without spontaneous rupture of tympanic membrane, recurrence not specified    -  Primary Acute, ongoing concern Patient was previously seen in urgent care on 10/30/2023 for ear pain and was found to have left otitis media.  She was started on Augmentin  for this and reports significant relief in symptoms since starting the medication Physical exam reveals mildly erythematous and slightly bulging TM on the left side.  Suspect her infection is likely improving after antibiotic use.  Recommend she continues antibiotic and finish his full course Follow-up as needed     Acute cough     Acute, new  concern Patient reports that she has had ongoing cough since feeling ill.  She reports that over-the-counter medications are not improving symptoms and she is coughing into the night keeping herself and others awake Will provide 30 mL supply of Tussionex cough syrup with recommendation to take only at night as this can be sedating. PDMP reviewed, no evidence of worrisome controlled substance use patterns at this time  Follow-up as needed for progressing or persistent symptoms   Relevant Medications   chlorpheniramine-HYDROcodone  (TUSSIONEX) 10-8 MG/5ML        No follow-ups on file.   I, Maximiano Lott E Ramil Edgington, PA-C, have reviewed all documentation for this visit. The documentation on 11/01/23 for the exam, diagnosis, procedures, and orders are all accurate and complete.   Rocky [place], MHS, PA-C [facility]

## 2023-11-03 ENCOUNTER — Ambulatory Visit: Payer: Medicare HMO | Admitting: Nurse Practitioner

## 2023-11-18 DIAGNOSIS — L309 Dermatitis, unspecified: Secondary | ICD-10-CM | POA: Diagnosis not present

## 2023-12-05 DIAGNOSIS — H26491 Other secondary cataract, right eye: Secondary | ICD-10-CM | POA: Diagnosis not present

## 2023-12-05 DIAGNOSIS — H35342 Macular cyst, hole, or pseudohole, left eye: Secondary | ICD-10-CM | POA: Diagnosis not present

## 2023-12-05 DIAGNOSIS — H35371 Puckering of macula, right eye: Secondary | ICD-10-CM | POA: Diagnosis not present

## 2023-12-05 DIAGNOSIS — H35372 Puckering of macula, left eye: Secondary | ICD-10-CM | POA: Diagnosis not present

## 2024-01-26 DIAGNOSIS — H26491 Other secondary cataract, right eye: Secondary | ICD-10-CM | POA: Diagnosis not present

## 2024-02-16 ENCOUNTER — Ambulatory Visit (INDEPENDENT_AMBULATORY_CARE_PROVIDER_SITE_OTHER): Payer: Self-pay

## 2024-02-16 DIAGNOSIS — Z0000 Encounter for general adult medical examination without abnormal findings: Secondary | ICD-10-CM

## 2024-02-16 NOTE — Progress Notes (Signed)
 Subjective:   Danielle Le is a 68 y.o. who presents for a Medicare Wellness preventive visit.  Visit Complete: Virtual I connected with  Danielle Le on 02/16/24 by a audio enabled telemedicine application and verified that I am speaking with the correct person using two identifiers.  Patient Location: Home  Provider Location: Office/Clinic  I discussed the limitations of evaluation and management by telemedicine. The patient expressed understanding and agreed to proceed.  Vital Signs: Because this visit was a virtual/telehealth visit, some criteria may be missing or patient reported. Any vitals not documented were not able to be obtained and vitals that have been documented are patient reported.  VideoDeclined- This patient declined Librarian, academic. Therefore the visit was completed with audio only.  Persons Participating in Visit: Patient.  AWV Questionnaire: No: Patient Medicare AWV questionnaire was not completed prior to this visit.  Cardiac Risk Factors include: advanced age (>2men, >2 women);dyslipidemia;obesity (BMI >30kg/m2);sedentary lifestyle     Objective:    There were no vitals filed for this visit. There is no height or weight on file to calculate BMI.     02/16/2024    1:08 PM 10/30/2023    3:10 PM 02/11/2023    1:11 PM 12/09/2022    2:28 PM 02/02/2022    3:43 PM 01/29/2021    3:45 PM 01/18/2020    9:09 AM  Advanced Directives  Does Patient Have a Medical Advance Directive? No No No No No No No  Would patient like information on creating a medical advance directive? No - Patient declined   No - Patient declined Yes (MAU/Ambulatory/Procedural Areas - Information given) Yes (MAU/Ambulatory/Procedural Areas - Information given) Yes (MAU/Ambulatory/Procedural Areas - Information given)    Current Medications (verified) Outpatient Encounter Medications as of 02/16/2024  Medication Sig   acetaminophen (TYLENOL) 500 MG tablet Take  1,000 mg by mouth every 8 (eight) hours as needed for moderate pain or mild pain.   aspirin EC 81 MG tablet Take 81 mg by mouth at bedtime.   Cholecalciferol (VITAMIN D) 125 MCG (5000 UT) CAPS Take 5,000 Units by mouth at bedtime.   cyanocobalamin (VITAMIN B12) 1000 MCG tablet Take 5,000 mcg by mouth Le.   Glycerin-Hypromellose-PEG 400 (ARTIFICIAL TEARS) 0.2-0.2-1 % SOLN Place 1 drop into both eyes Le as needed (Dry eyes).   Iron, Ferrous Sulfate, 325 (65 Fe) MG TABS Take 325 mg by mouth Le.   zinc gluconate 50 MG tablet Take 50 mg by mouth Le.   chlorpheniramine-HYDROcodone (TUSSIONEX) 10-8 MG/5ML Take 5 mLs by mouth at bedtime as needed for cough. (Patient not taking: Reported on 02/16/2024)   OMEPRAZOLE PO Take 1 capsule by mouth at bedtime. (Patient not taking: Reported on 02/16/2024)   rosuvastatin (CRESTOR) 10 MG tablet Take 1 tablet (10 mg total) by mouth Le. (Patient not taking: Reported on 02/16/2024)   traMADol (ULTRAM) 50 MG tablet Take 1 tablet (50 mg total) by mouth every 6 (six) hours as needed for moderate pain. (Patient not taking: Reported on 02/16/2024)   [DISCONTINUED] amoxicillin-clavulanate (AUGMENTIN) 875-125 MG tablet Take 1 tablet by mouth every 12 (twelve) hours.   No facility-administered encounter medications on file as of 02/16/2024.    Allergies (verified) Amoxicillin   History: Past Medical History:  Diagnosis Date   Anemia    Arthritis    Cataract    GERD (gastroesophageal reflux disease)    Heart murmur    Hypertension    Past Surgical History:  Procedure Laterality Date   ABDOMINAL HYSTERECTOMY     CATARACT EXTRACTION W/PHACO Left 07/04/2019   Procedure: CATARACT EXTRACTION PHACO AND INTRAOCULAR LENS PLACEMENT (IOC) left  00:43.2  12.4%  5.40;  Surgeon: Danielle Kidney, MD;  Location: [facility];  Service: Ophthalmology;  Laterality: Left;   CATARACT EXTRACTION W/PHACO Right 07/25/2019   Procedure: CATARACT EXTRACTION PHACO  AND INTRAOCULAR LENS PLACEMENT (IOC) RIGHT  00:41.9  12.4%  5.18;  Surgeon: Danielle Kidney, MD;  Location: [facility];  Service: Ophthalmology;  Laterality: Right;   CESAREAN SECTION     COLONOSCOPY WITH PROPOFOL N/A 01/04/2019   Procedure: COLONOSCOPY WITH PROPOFOL;  Surgeon: Danielle Daily, MD;  Location: [facility];  Service: Gastroenterology;  Laterality: N/A;   ESOPHAGOGASTRODUODENOSCOPY (EGD) WITH PROPOFOL N/A 01/04/2019   Procedure: ESOPHAGOGASTRODUODENOSCOPY (EGD) WITH PROPOFOL;  Surgeon: Danielle Daily, MD;  Location: [facility];  Service: Gastroenterology;  Laterality: N/A;   JOINT REPLACEMENT Left 03/18/2022   total knee replacement by  Danielle Le in [place]   PARATHYROIDECTOMY Right 12/10/2022   Procedure: RIGHT INFERIOR PARATHYROIDECTOMY;  Surgeon: Danielle Billow, MD;  Location: [facility];  Service: General;  Laterality: Right;   Family History  Problem Relation Age of Onset   Hypertension Mother    Hyperlipidemia Mother    Dementia Father    Hypertension Father    AVM Brother    Anxiety disorder Son    Seizures Son    Dementia Paternal Grandfather    Hypertension Brother    Hypercalcemia Neg Hx    Social History   Socioeconomic History   Marital status: Divorced    Spouse name: Not on file   Number of children: 1   Years of education: 12   Highest education level: Associate degree: occupational, Scientist, product/process development, or vocational program  Occupational History   Occupation: stay home mom  Tobacco Use   Smoking status: Never   Smokeless tobacco: Never  Vaping Use   Vaping status: Never Used  Substance and Sexual Activity   Alcohol use: Not Currently   Drug use: Not Currently   Sexual activity: Not Currently  Other Topics Concern   Not on file  Social History Narrative   Pt has 70 year old special needs son that lives at home who is non ambulatory/non verbal and she is primary caregiver.    Social Drivers of Corporate investment banker  Strain: Low Risk  (02/16/2024)   Overall Financial Resource Strain (CARDIA)    Difficulty of Paying Living Expenses: Not hard at all  Food Insecurity: No Food Insecurity (02/16/2024)   Hunger Vital Sign    Worried About Running Out of Food in the Last Year: Never true    Ran Out of Food in the Last Year: Never true  Transportation Needs: No Transportation Needs (02/16/2024)   PRAPARE - Administrator, Civil Service (Medical): No    Lack of Transportation (Non-Medical): No  Physical Activity: Insufficiently Active (02/16/2024)   Exercise Vital Sign    Days of Exercise per Week: 2 days    Minutes of Exercise per Session: 20 min  Stress: No Stress Concern Present (02/16/2024)   [employer] of Occupational Health - Occupational Stress Questionnaire    Feeling of Stress : Not at all  Social Connections: Moderately Isolated (02/16/2024)   Social Connection and Isolation Panel [NHANES]    Frequency of Communication with Friends and Family: More than three times a week    Frequency of Social Gatherings  with Friends and Family: Never    Attends Religious Services: Never    Database administrator or Organizations: Yes    Attends Engineer, structural: More than 4 times per year    Marital Status: Divorced    Tobacco Counseling Counseling given: Not Answered    Clinical Intake:  Pre-visit preparation completed: Yes  Pain : No/denies pain     BMI - recorded: 39.7 Nutritional Status: BMI > 30  Obese Nutritional Risks: None Diabetes: No  No results found for: "HGBA1C"   How often do you need to have someone help you when you read instructions, pamphlets, or other written materials from your doctor or pharmacy?: 1 - Never  Interpreter Needed?: No  Information entered by :: Dellie Fergusson, LPN   Activities of Le Living    02/16/2024    1:09 PM 08/03/2023    2:09 PM  In your present state of health, do you have any difficulty performing the following  activities:  Hearing? 0 0  Vision? 0 1  Difficulty concentrating or making decisions? 0 0  Walking or climbing stairs? 0 0  Dressing or bathing? 0 0  Doing errands, shopping? 0 0  Preparing Food and eating ? N   Using the Toilet? N   In the past six months, have you accidently leaked urine? N   Do you have problems with loss of bowel control? N   Managing your Medications? N   Managing your Finances? N   Housekeeping or managing your Housekeeping? N     Patient Care Team: Tapia, Leisa, PA-C as PCP - General (Family Medicine) Gwyndolyn Lerner, MD (Inactive) as Consulting Physician (Endocrinology) Danielle Kidney, MD as Referring Physician (Ophthalmology)  Indicate any recent Medical Services you may have received from other than [facility] providers in the past year (date may be approximate).     Assessment:   This is a routine wellness examination for Zaria.  Hearing/Vision screen Hearing Screening - Comments:: NO AIDS Vision Screening - Comments:: WEARS GLASSES ALL DAY- DanielleBRASINGTON   Goals Addressed             This Visit's Progress    DIET - EAT MORE FRUITS AND VEGETABLES         Depression Screen     02/16/2024    1:06 PM 08/03/2023    2:09 PM 07/11/2023    3:09 PM 02/11/2023    1:10 PM 06/10/2022    1:49 PM 02/02/2022    3:42 PM 07/29/2021    3:59 PM  PHQ 2/9 Scores  PHQ - 2 Score 0 1 0 0 0 0 0  PHQ- 9 Score 0 4 0  0      Fall Risk     02/16/2024    1:09 PM 08/03/2023    2:09 PM 07/11/2023    3:02 PM 02/11/2023    1:04 PM 06/10/2022    1:49 PM  Fall Risk   Falls in the past year? 0 0 0 0 0  Number falls in past yr: 0 0 0 0 0  Injury with Fall? 0 0 0 0 0  Risk for fall due to : No Fall Risks  No Fall Risks No Fall Risks No Fall Risks  Follow up Falls prevention discussed;Falls evaluation completed  Falls prevention discussed;Education provided;Falls evaluation completed Education provided;Falls prevention discussed Falls prevention discussed;Education provided     MEDICARE RISK AT HOME:  Medicare Risk at Home Any stairs in or around the home?:  Yes If so, are there any without handrails?: No Home free of loose throw rugs in walkways, pet beds, electrical cords, etc?: Yes Adequate lighting in your home to reduce risk of falls?: Yes Life alert?: No Use of a cane, walker or w/c?: No Grab bars in the bathroom?: Yes Shower chair or bench in shower?: Yes Elevated toilet seat or a handicapped toilet?: Yes  TIMED UP AND GO:  Was the test performed?  No  Cognitive Function: 6CIT completed        02/16/2024    1:11 PM 02/11/2023    1:15 PM 01/18/2020    9:14 AM  6CIT Screen  What Year? 0 points 0 points 0 points  What month? 0 points 0 points 0 points  What time? 0 points 0 points 0 points  Count back from 20 0 points 0 points 0 points  Months in reverse 0 points 0 points 0 points  Repeat phrase 2 points 0 points 0 points  Total Score 2 points 0 points 0 points    Immunizations Immunization History  Administered Date(s) Administered   Moderna Sars-Covid-2 Vaccination 01/11/2020, 02/06/2020, 09/08/2020    Screening Tests Health Maintenance  Topic Date Due   Zoster Vaccines- Shingrix (1 of 2) Never done   COVID-19 Vaccine (4 - 2024-25 season) 07/03/2023   Pneumonia Vaccine 31+ Years old (1 of 1 - PCV) 07/10/2024 (Originally 11/03/2003)   INFLUENZA VACCINE  06/01/2024   MAMMOGRAM  10/04/2024   DEXA SCAN  10/04/2025   Colonoscopy  01/03/2026   Hepatitis C Screening  Completed   HPV VACCINES  Aged Out   Meningococcal B Vaccine  Aged Out   DTaP/Tdap/Td  Discontinued    Health Maintenance  Health Maintenance Due  Topic Date Due   Zoster Vaccines- Shingrix (1 of 2) Never done   COVID-19 Vaccine (4 - 2024-25 season) 07/03/2023   Health Maintenance Items Addressed: DECLINES ALL SHOTS; UP TO DATE ON MAMMOGRAM, BONE DENSITY & COLONOSCOPY  Additional Screening:  Vision Screening: Recommended annual ophthalmology exams for early  detection of glaucoma and other disorders of the eye.  Dental Screening: Recommended annual dental exams for proper oral hygiene  Community Resource Referral / Chronic Care Management: CRR required this visit?  No   CCM required this visit?  No     Plan:     I have personally reviewed and noted the following in the patient's chart:   Medical and social history Use of alcohol, tobacco or illicit drugs  Current medications and supplements including opioid prescriptions. Patient is not currently taking opioid prescriptions. Functional ability and status Nutritional status Physical activity Advanced directives List of other physicians Hospitalizations, surgeries, and ER visits in previous 12 months Vitals Screenings to include cognitive, depression, and falls Referrals and appointments  In addition, I have reviewed and discussed with patient certain preventive protocols, quality metrics, and best practice recommendations. A written personalized care plan for preventive services as well as general preventive health recommendations were provided to patient.     Hal Hope, LPN   1/61/0960   After Visit Summary: (MyChart) Due to this being a telephonic visit, the after visit summary with patients personalized plan was offered to patient via MyChart   Notes: Nothing significant to report at this time.

## 2024-02-16 NOTE — Patient Instructions (Addendum)
 Ms. Baranowski , Thank you for taking time to come for your Medicare Wellness Visit. I appreciate your ongoing commitment to your health goals. Please review the following plan we discussed and let me know if I can assist you in the future.   Referrals/Orders/Follow-Ups/Clinician Recommendations: NONE  This is a list of the screening recommended for you and due dates:  Health Maintenance  Topic Date Due   Zoster (Shingles) Vaccine (1 of 2) Never done   COVID-19 Vaccine (4 - 2024-25 season) 07/03/2023   Pneumonia Vaccine (1 of 1 - PCV) 07/10/2024*   Flu Shot  06/01/2024   Mammogram  10/04/2024   DEXA scan (bone density measurement)  10/04/2025   Colon Cancer Screening  01/03/2026   Hepatitis C Screening  Completed   HPV Vaccine  Aged Out   Meningitis B Vaccine  Aged Out   DTaP/Tdap/Td vaccine  Discontinued  *Topic was postponed. The date shown is not the original due date.    Advanced directives: (ACP Link)Information on Advanced Care Planning can be found at [place]  Secretary of [facility] Advance Health Care Directives Advance Health Care Directives. [email or web address]   Next Medicare Annual Wellness Visit scheduled for next year: Yes   02/21/25 @ 1:10 PM BY PHONE

## 2024-04-16 ENCOUNTER — Emergency Department

## 2024-04-16 ENCOUNTER — Emergency Department
Admission: EM | Admit: 2024-04-16 | Discharge: 2024-04-16 | Disposition: A | Discharge status: Home / Self Care | Attending: Emergency Medicine | Admitting: Emergency Medicine

## 2024-04-16 ENCOUNTER — Other Ambulatory Visit: Payer: Self-pay

## 2024-04-16 DIAGNOSIS — Y92480 Sidewalk as the place of occurrence of the external cause: Secondary | ICD-10-CM | POA: Insufficient documentation

## 2024-04-16 DIAGNOSIS — W010XXA Fall on same level from slipping, tripping and stumbling without subsequent striking against object, initial encounter: Secondary | ICD-10-CM | POA: Insufficient documentation

## 2024-04-16 DIAGNOSIS — S6292XA Unspecified fracture of left wrist and hand, initial encounter for closed fracture: Secondary | ICD-10-CM | POA: Diagnosis not present

## 2024-04-16 DIAGNOSIS — Z7982 Long term (current) use of aspirin: Secondary | ICD-10-CM | POA: Insufficient documentation

## 2024-04-16 DIAGNOSIS — S4992XA Unspecified injury of left shoulder and upper arm, initial encounter: Secondary | ICD-10-CM | POA: Diagnosis present

## 2024-04-16 DIAGNOSIS — S52572A Other intraarticular fracture of lower end of left radius, initial encounter for closed fracture: Secondary | ICD-10-CM | POA: Diagnosis not present

## 2024-04-16 DIAGNOSIS — S52612A Displaced fracture of left ulna styloid process, initial encounter for closed fracture: Secondary | ICD-10-CM | POA: Diagnosis not present

## 2024-04-16 DIAGNOSIS — S42292A Other displaced fracture of upper end of left humerus, initial encounter for closed fracture: Secondary | ICD-10-CM | POA: Diagnosis not present

## 2024-04-16 DIAGNOSIS — M1812 Unilateral primary osteoarthritis of first carpometacarpal joint, left hand: Secondary | ICD-10-CM | POA: Diagnosis not present

## 2024-04-16 DIAGNOSIS — S42352A Displaced comminuted fracture of shaft of humerus, left arm, initial encounter for closed fracture: Secondary | ICD-10-CM | POA: Diagnosis not present

## 2024-04-16 DIAGNOSIS — S52502A Unspecified fracture of the lower end of left radius, initial encounter for closed fracture: Secondary | ICD-10-CM

## 2024-04-16 DIAGNOSIS — Z96652 Presence of left artificial knee joint: Secondary | ICD-10-CM | POA: Insufficient documentation

## 2024-04-16 DIAGNOSIS — W19XXXA Unspecified fall, initial encounter: Secondary | ICD-10-CM

## 2024-04-16 DIAGNOSIS — S42202A Unspecified fracture of upper end of left humerus, initial encounter for closed fracture: Secondary | ICD-10-CM

## 2024-04-16 DIAGNOSIS — I10 Essential (primary) hypertension: Secondary | ICD-10-CM | POA: Diagnosis not present

## 2024-04-16 MED ORDER — MORPHINE SULFATE (PF) 4 MG/ML IV SOLN
4.0000 mg | Freq: Once | INTRAVENOUS | Status: AC
  Administered 2024-04-16: 4 mg via INTRAVENOUS
  Filled 2024-04-16: qty 1

## 2024-04-16 MED ORDER — MELOXICAM 15 MG PO TABS
15.0000 mg | ORAL_TABLET | Freq: Every day | ORAL | 0 refills | Status: AC
Start: 2024-04-16 — End: 2024-04-23

## 2024-04-16 MED ORDER — HYDROCODONE-ACETAMINOPHEN 7.5-325 MG PO TABS: 1.0000 | ORAL_TABLET | Freq: Four times a day (QID) | ORAL | 0 refills | Status: DC | PRN

## 2024-04-16 MED ORDER — HYDROCODONE-ACETAMINOPHEN 5-325 MG PO TABS
1.0000 | ORAL_TABLET | Freq: Once | ORAL | Status: AC
  Administered 2024-04-16: 1 via ORAL
  Filled 2024-04-16: qty 1

## 2024-04-16 MED ORDER — ONDANSETRON HCL 4 MG/2ML IJ SOLN
4.0000 mg | Freq: Once | INTRAMUSCULAR | Status: DC
  Filled 2024-04-16: qty 2

## 2024-04-16 MED ORDER — LIDOCAINE 5 % EX PTCH
1.0000 | MEDICATED_PATCH | CUTANEOUS | Status: DC
  Administered 2024-04-16: 1 via TRANSDERMAL
  Filled 2024-04-16: qty 1

## 2024-04-16 MED ORDER — LIDOCAINE 5 % EX PTCH: 1.0000 | MEDICATED_PATCH | Freq: Two times a day (BID) | CUTANEOUS | 0 refills | Status: AC

## 2024-04-16 NOTE — ED Provider Notes (Signed)
 [facility] Provider Note    Event Date/Time   First MD Initiated Contact with Patient 04/16/24 2048     (approximate)   History   Arm Injury    HPI  Danielle Le is a 68 y.o. female    with a past medical history of hyperparathyroidism, hypertension, stiffness of left knee, total knee replacement, osteoporosis with no significant past medical history who presents to the ED complaining of left shoulder and wrist pain. According to the patient, she was in her driveway and she tripped on a rock and fell on her left upper extremity.  Patient states pain is 10/10.  Patient is not taking blood thinners, she is taking aspirin .  Patient denies hitting her head, no loss of consciousness.     Patient Active Problem List   Diagnosis Date Noted   Gastroesophageal reflux disease 06/17/2022   Osteoporosis without current pathological fracture 05/22/2021   Class 2 severe obesity with serious comorbidity and body mass index (BMI) of 37.0 to 37.9 in adult [facility]) 05/22/2021   Hyperparathyroidism, primary (HCC) 07/22/2020   Hypercalcemia 02/06/2020   Vitamin D  deficiency 02/06/2020   Hypertriglyceridemia 02/06/2020   Cardiac murmur 01/05/2019   Iron  deficiency anemia 01/05/2019   Hyperlipidemia 01/05/2019   Obesity (BMI 35.0-39.9 without comorbidity) 12/18/2018   Family history of high cholesterol 12/18/2018   Family history of dementia 12/18/2018     ROS: Patient currently denies any vision changes, tinnitus, difficulty speaking, facial droop, sore throat, chest pain, shortness of breath, abdominal pain, nausea/vomiting/diarrhea, dysuria, or weakness/numbness/paresthesias in any extremity   Physical Exam   Triage Vital Signs: ED Triage Vitals  Encounter Vitals Group     BP 04/16/24 2015 (!) 201/99     Girls Systolic BP Percentile --      Girls Diastolic BP Percentile --      Boys Systolic BP Percentile --      Boys Diastolic BP Percentile --      Pulse Rate  04/16/24 2009 80     Resp 04/16/24 2009 16     Temp 04/16/24 2009 98.4 F (36.9 C)     Temp Source 04/16/24 2009 Oral     SpO2 04/16/24 2009 97 %     Weight 04/16/24 2011 214 lb (97.1 kg)     Height 04/16/24 2011 5' 2 (1.575 m)     Head Circumference --      Peak Flow --      Pain Score 04/16/24 2011 8     Pain Loc --      Pain Education --      Exclude from Growth Chart --     Most recent vital signs: Vitals:   04/16/24 2009 04/16/24 2015  BP:  (!) 201/99  Pulse: 80   Resp: 16   Temp: 98.4 F (36.9 C)   SpO2: 97%   Physical exam : vitals and nursing note reviewed.  In triage patient was hypertensive   Constitutional: Alert, NAD. Able to speak in complete sentences without cough or dyspnea  Eyes: Conjunctivae are normal.  Head: Atraumatic. Nose: No congestion/rhinnorhea. Mouth/Throat: Mucous membranes are moist.   Neck: Painless ROM. Supple. No JVD, nodes, thyromegaly  Cardiovascular:   Good peripheral circulation.RRR no murmurs, gallops, rubs  Respiratory: Normal respiratory effort.  No retractions. Clear to auscultation bilaterally without wheezing or crackles  Gastrointestinal: Soft and nontender.  Musculoskeletal:  no deformity Left shoulder the skin is intact no presence of ecchymosis or hematomas.  Sensation is intact, and strength decreased, full ROM limited by pain.  Pulses positive Left elbow: Skin is intact no presence of ecchymosis or hematomas, lection and extension are limited by pain, sensation is intact.  Left wrist: Skin is intact no presence of ecchymosis or hematoma, deformity, edema.  Full ROM limited by pain.  Patient is able to move her fingers.  Sensation is intact Neurologic:  MAE spontaneously. No gross focal neurologic deficits are appreciated.  Skin:  Skin is warm, dry and intact. No rash noted. Psychiatric: Mood and affect are normal. Speech and behavior are normal.    ED Results / Procedures / Treatments   Labs (all labs ordered are  listed, but only abnormal results are displayed) Labs Reviewed - No data to display   EKG     RADIOLOGY I independently reviewed and interpreted imaging and agree with radiologists findings.      PROCEDURES:  Critical Care performed:   Procedures   MEDICATIONS ORDERED IN ED: Medications  lidocaine  (LIDODERM ) 5 % 1 patch (1 patch Transdermal Patch Applied 04/16/24 2255)  ondansetron  (ZOFRAN ) injection 4 mg (4 mg Intravenous Not Given 04/16/24 2258)  HYDROcodone-acetaminophen  (NORCO/VICODIN) 5-325 MG per tablet 1 tablet (1 tablet Oral Given 04/16/24 2248)  morphine (PF) 4 MG/ML injection 4 mg (4 mg Intravenous Given 04/16/24 2254)   Clinical Course as of 04/16/24 2338  Mon Apr 16, 2024  2201 DG Humerus Left Acute comminuted fracture of the humeral head and neck with displaced tuberosity fracture fragment. Inferiorly positioned humeral head which may be due to positioning or effusion   [AE]  2202 DG Forearm Left Acute comminuted and displaced intra-articular distal radius fracture. Acute displaced ulnar styloid process fracture.   [AE]  2203 DG Hand Complete Left No acute osseous abnormality of the hand. Wrist fractures dictated separately.      [AE]  2204 DG Wrist Complete Left Acute comminuted impacted and displaced intra-articular distal radius fracture. Acute mildly displaced ulnar styloid process fracture.   [AE]  2204 DG Shoulder Left Port AC joint appears intact. Acute comminuted fracture involving the humeral head and neck, displaced 4.9 cm tuberosity fracture fragment. Low lying appearance of the humeral head, no convincing anterior or posterior displacement on Y-view.   [AE]  2257 Consulted orthopedic on-call Dr. Ernesta Heading, who advised to have left shoulder CT scan, sling, splint of the wrist and follow-up with him this week or next week.  Updated patient and his family who agreed with the plan. [AE]  2324 CT Shoulder Left Wo Contrast . Comminuted left  humeral head/neck fracture with 4 dominant parts, as described above. 2. Hemarthrosis, without dislocation.   [AE]  2324 Updated patient with results of the CT shoulder [AE]    Clinical Course User Index [AE] Awilda Lennox, PA-C    IMPRESSION / MDM / ASSESSMENT AND PLAN / ED COURSE  I reviewed the triage vital signs and the nursing notes.  Differential diagnosis includes, but is not limited to, humeral fracture, radial fracture, shoulder dislocation, foreign body,  Patient's presentation is most consistent with acute complicated illness / injury requiring diagnostic workup.   CINTYA DAUGHETY is a 68 y.o., female presents today after falling on her driveway on her left shoulder.  Pain is 10/10.  Physical exam as skin of the upper left extremity is intact, no ecchymosis or hematomas.  Full ROM is limited by pain, sensation is intact, pulses positive.  There is deformity at the level of the wrist.  Patient's diagnosis  is consistent with acute comminuted fracture of the humeral head and neck with displaced tuberosity fracture fragment.  Inferior position and humeral head which may be due to positioning or effusion, hemarthrosis.  Acute comminuted and displaced intra-articular distal radius fracture.  Acute displaced ulnar styloid process fracture. I independently reviewed and interpreted imaging and agree with radiologists findings.  Consulted Dr.Cairns orthopedic doctor on-call who advise to order shoulder CT, wrist splint, and shoulder sling, with a follow-up with him this week or next week .  CT scan was authorized by Dr. Cleora Daft. I did review the patient's allergies and medications.During admission patient received morphine, Zofran , Norco, lidocaine  patch.the patient is in stable and satisfactory condition for discharge home  Patient will be discharged home with prescriptions for Percocet, meloxicam, lidocaine  patch.  Advised patient to take Norco only at bedtime if she is feeling drowsy during  the day. Patient is discharged with sling, and wrist splint. Patient is to follow up with orthopedics, Dr. Ernesta Heading, this week or next week as needed or otherwise directed. Patient is given ED precautions to return to the ED for any worsening or new symptoms. Discussed plan of care with patient, answered all of patient's questions, Patient agreeable to plan of care. Advised patient to take medications according to the instructions on the label. Discussed possible side effects of new medications. Patient verbalized understanding.   FINAL CLINICAL IMPRESSION(S) / ED DIAGNOSES   Final diagnoses:  Fall, initial encounter  Closed fracture of distal end of left radius, unspecified fracture morphology, initial encounter  Closed fracture of proximal end of left humerus, unspecified fracture morphology, initial encounter     Rx / DC Orders   ED Discharge Orders          Ordered    HYDROcodone-acetaminophen  (NORCO) 7.5-325 MG tablet  Every 6 hours PRN        04/16/24 2308    lidocaine  (LIDODERM ) 5 %  Every 12 hours        04/16/24 2308    meloxicam (MOBIC) 15 MG tablet  Daily        04/16/24 2308             Note:  This document was prepared using Dragon voice recognition software and may include unintentional dictation errors.   Awilda Lennox, PA-C 04/16/24 2338    Twilla Galea, MD 04/17/24 2250

## 2024-04-16 NOTE — ED Notes (Signed)
 ED Provider at bedside.

## 2024-04-16 NOTE — ED Triage Notes (Addendum)
 Pt presents via POV c/o left shoulder, arm, and wrist pain after mechanical fall tonight. Reports tripped over a rock. Denies LOC.

## 2024-04-16 NOTE — Discharge Instructions (Addendum)
 You have been diagnosed with fall, closed fracture of proximal end of left humerus, closed fracture of the distal end of the left radius.  Please take Norco 1 tablet by mouth every 6 hours as needed for pain.  If you feel drowsy you can take Norco only at bedtime.  Norco can make you drowsy.  You can apply lidocaine  patch every 12 hours.  You can take meloxicam 1 tablet with breakfast for pain.  If you are not taking Norco during the day you can take acetaminophen  650 mg every 8 hours.  Do not take Norco and acetaminophen  at the same time. Please call orthopedic doctor Ernesta Heading and make an appointment for a follow-up.  Please come back to ED or go to your PCP if you have new symptoms or symptoms worsen

## 2024-04-18 ENCOUNTER — Ambulatory Visit (INDEPENDENT_AMBULATORY_CARE_PROVIDER_SITE_OTHER): Admitting: Orthopedic Surgery

## 2024-04-18 DIAGNOSIS — S52572A Other intraarticular fracture of lower end of left radius, initial encounter for closed fracture: Secondary | ICD-10-CM | POA: Diagnosis not present

## 2024-04-18 DIAGNOSIS — S42292A Other displaced fracture of upper end of left humerus, initial encounter for closed fracture: Secondary | ICD-10-CM

## 2024-04-18 DIAGNOSIS — W19XXXA Unspecified fall, initial encounter: Secondary | ICD-10-CM | POA: Diagnosis not present

## 2024-04-18 NOTE — Progress Notes (Signed)
 New Patient Visit  Assessment: Danielle Le is a 68 y.o. female with the following: 1. Other closed intra-articular fracture of distal end of left radius, initial encounter 2. Other closed displaced fracture of proximal end of left humerus, initial encounter  Plan: Danielle Le Her fell and sustained a fracture of the left proximal humerus, with displacement of the greater tuberosity.  In addition, she has an impacted fracture of the left distal radius.  At this point, the fractures remain stable.  There are concerns with both injuries, and this was discussed.  I do think that she can do well without surgery, provided that there is no further displacement.  She would like to try nonoperative management.  I think it is reasonable.  She is to remain in the sling.  We left the splint in place.  I would like to see her back in 1 week with new x-rays.  If anything changes, we may consider surgery to help stabilize her injuries.  Follow-up: Return in about 1 week (around 04/25/2024).  Subjective:  Chief Complaint  Patient presents with   Shoulder Pain    Left humerus fx   Wrist Pain    Left wrist fx    History of Present Illness: Danielle Le is a 68 y.o. female who presents for evaluation of left arm pain.  She was moving some garbage bins to the end of her driveway, when she tripped on a rock and fell.  She had immediate pain.  She was evaluated in the emergency department.  She was noted to have a fracture in the proximal humerus, as well as the left wrist.  She is right-hand dominant.  She was placed in a sling, as well as a sugar-tong splint.  She continues to have pain.  She has tolerated the immobilization.  No numbness or tingling.  She did not hit her head.   Review of Systems: No fevers or chills No numbness or tingling No chest pain No shortness of breath No bowel or bladder dysfunction No GI distress No headaches   Medical History:  Past Medical History:  Diagnosis Date    Anemia    Arthritis    Cataract    GERD (gastroesophageal reflux disease)    Heart murmur    Hypertension     Past Surgical History:  Procedure Laterality Date   ABDOMINAL HYSTERECTOMY     CATARACT EXTRACTION W/PHACO Left 07/04/2019   Procedure: CATARACT EXTRACTION PHACO AND INTRAOCULAR LENS PLACEMENT (IOC) left  00:43.2  12.4%  5.40;  Surgeon: Mittie Gaskin, MD;  Location: [facility];  Service: Ophthalmology;  Laterality: Left;   CATARACT EXTRACTION W/PHACO Right 07/25/2019   Procedure: CATARACT EXTRACTION PHACO AND INTRAOCULAR LENS PLACEMENT (IOC) RIGHT  00:41.9  12.4%  5.18;  Surgeon: Mittie Gaskin, MD;  Location: [facility];  Service: Ophthalmology;  Laterality: Right;   CESAREAN SECTION     COLONOSCOPY WITH PROPOFOL  N/A 01/04/2019   Procedure: COLONOSCOPY WITH PROPOFOL ;  Surgeon: Unk Corinn Skiff, MD;  Location: [facility] END[facility];  Service: Gastroenterology;  Laterality: N/A;   ESOPHAGOGASTRODUODENOSCOPY (EGD) WITH PROPOFOL  N/A 01/04/2019   Procedure: ESOPHAGOGASTRODUODENOSCOPY (EGD) WITH PROPOFOL ;  Surgeon: Unk Corinn Skiff, MD;  Location: [facility] ENDOSCOPY;  Service: Gastroenterology;  Laterality: N/A;   JOINT REPLACEMENT Left 03/18/2022   total knee replacement by  Dr. Leora in [place]   PARATHYROIDECTOMY Right 12/10/2022   Procedure: RIGHT INFERIOR PARATHYROIDECTOMY;  Surgeon: Eletha Boas, MD;  Location: [facility];  Service: General;  Laterality:  Right;    Family History  Problem Relation Age of Onset   Hypertension Mother    Hyperlipidemia Mother    Dementia Father    Hypertension Father    AVM Brother    Anxiety disorder Son    Seizures Son    Dementia Paternal Grandfather    Hypertension Brother    Hypercalcemia Neg Hx    Social History   Tobacco Use   Smoking status: Never   Smokeless tobacco: Never  Vaping Use   Vaping status: Never Used  Substance Use Topics   Alcohol use: Not Currently   Drug use: Not Currently     Allergies  Allergen Reactions   Amoxicillin  Rash    Current Meds  Medication Sig   aspirin  EC 81 MG tablet Take 81 mg by mouth at bedtime.   Cholecalciferol (VITAMIN D ) 125 MCG (5000 UT) CAPS Take 5,000 Units by mouth at bedtime.   cyanocobalamin  (VITAMIN B12) 1000 MCG tablet Take 5,000 mcg by mouth daily.   Glycerin-Hypromellose-PEG 400 (ARTIFICIAL TEARS) 0.2-0.2-1 % SOLN Place 1 drop into both eyes daily as needed (Dry eyes).   HYDROcodone -acetaminophen  (NORCO) 7.5-325 MG tablet Take 1 tablet by mouth every 6 (six) hours as needed for moderate pain (pain score 4-6).   Iron , Ferrous Sulfate , 325 (65 Fe) MG TABS Take 325 mg by mouth daily.   lidocaine  (LIDODERM ) 5 % Place 1 patch onto the skin every 12 (twelve) hours for 5 days. Remove & Discard patch within 12 hours or as directed by MD   meloxicam (MOBIC) 15 MG tablet Take 1 tablet (15 mg total) by mouth daily for 7 days.   zinc gluconate 50 MG tablet Take 50 mg by mouth daily.    Objective: There were no vitals taken for this visit.  Physical Exam:  General: Alert and oriented. and No acute distress. Gait: Normal gait.  Left shoulder is in a sling.  Extensive bruising.  Tenderness to palpation.  Sensation intact in the axillary nerve distribution.  Sugar-tong splint is intact.  Fingers are warm and well-perfused.  Sensation to her fingers is intact.  She is able to wiggle her fingers.  IMAGING: I personally reviewed images previously obtained from the ED   X-rays and CT scan of the left shoulder demonstrates a proximal humerus fracture, with a large greater tuberosity fracture fragment, which is displaced.  Shoulder joint is reduced.  X-rays of the left wrist were obtained in the emergency department.  There is impaction of the distal radius, with intra-articular split.  There is a small step-off.  There is loss of radial height.   New Medications:  No orders of the defined types were placed in this  encounter.     Danielle [name], MD  04/20/2024 8:46 AM

## 2024-04-20 ENCOUNTER — Encounter: Payer: Self-pay | Admitting: Orthopedic Surgery

## 2024-04-20 ENCOUNTER — Encounter: Payer: Self-pay | Admitting: Internal Medicine

## 2024-04-20 ENCOUNTER — Telehealth (INDEPENDENT_AMBULATORY_CARE_PROVIDER_SITE_OTHER): Admitting: Internal Medicine

## 2024-04-20 DIAGNOSIS — E213 Hyperparathyroidism, unspecified: Secondary | ICD-10-CM

## 2024-04-20 DIAGNOSIS — E559 Vitamin D deficiency, unspecified: Secondary | ICD-10-CM

## 2024-04-20 DIAGNOSIS — M810 Age-related osteoporosis without current pathological fracture: Secondary | ICD-10-CM

## 2024-04-20 NOTE — Patient Instructions (Signed)
 Please come back for labs when convenient. Please call our main office number [phone]) to schedule a lab appointment.  After these results are back, we will try to start Prolia, as discussed.  For now, continue vitamin D  5000 units daily.  Please schedule another appointment in 1 year.

## 2024-04-20 NOTE — Progress Notes (Signed)
 Patient ID: Danielle Le, female   DOB: 05-12-1956, 68 y.o.   MRN: [id number]  Patient location: Home My location: Office Persons participating in the virtual visit: patient, provider  Referring Provider: Adeline Hone, PA-C  I connected with the patient on 04/20/24 at  11:50 AM EDT by a video enabled telemedicine application and verified that I am speaking with the correct person.   I discussed the limitations of evaluation and management by telemedicine and the availability of in person appointments. The patient expressed understanding and agreed to proceed.   Details of the encounter are shown below.  HPI  Danielle Le is a 68 y.o.-year-old female, returning for follow-up for hypercalcemia/hyperparathyroidism.  She previously saw Danielle Le, last visit with me 1 year and 3 months ago.  Interim history: She had a humeral fracture recently, after a fall, 04/16/2024.  The appointment was scheduled virtually because of this. At last visit she mentioned generalized muscle aches for approximately 2 weeks after taking Boniva  on the first of every month.  She stopped Boniva  since.  The muscle aches improved.  Reviewed and addended history: Patient was diagnosed with hypercalcemia approximately in 2020.    She previously saw Danielle Le, who diagnosed primary hyperparathyroidism and suggested parathyroidectomy and referred patient to surgery.  She did not have this at the time of our first visit (09/2022) as she was not called to schedule the surgery appt.  She also had problems arranging care for her son who was not mobile.  At last visit, we confirmed primary hyperparathyroidism and referred her to Danielle Le.  Further investigation:  Thyroid  ultrasound (11/22/2022): 1 right inferior nodule measuring 0.8 x 0.6 x 0.6 cm, possibly parathyroid  adenoma.  Technetium sestamibi scan (11/23/2022): Right inferior increase activity consistent with adenoma  Right inferior parathyroidectomy by Danielle Le  12/10/2022: 0.781 g enlarged hypercellular parathyroid  tissue consistent with adenoma  I reviewed pt's pertinent labs: Lab Results  Component Value Date   PTH 80 (H) 07/11/2023   PTH 152 (H) 09/03/2022   PTH 136 (H) 06/10/2022   PTH 208 (H) 01/13/2022   PTH 98 (H) 07/23/2021   PTH 81 (H) 01/19/2021   PTH 129 (H) 07/22/2020   PTH 106 (H) 04/30/2020   CALCIUM  10.3 07/11/2023   CALCIUM  9.9 12/09/2022   CALCIUM  10.8 (H) 09/03/2022   CALCIUM  11.4 (H) 06/10/2022   CALCIUM  11.8 (H) 01/13/2022   CALCIUM  11.0 (H) 07/23/2021   CALCIUM  11.2 (H) 07/23/2021   CALCIUM  11.4 (H) 01/19/2021   CALCIUM  10.8 (H) 10/02/2020   CALCIUM  10.9 (H) 07/22/2020  01/06/2023: PTH 60, calcium  10.1 (8.7-10.3) 09/20/2022: Ionized calcium  5.6 (4.7-5.5)  Other pertinent investigation was reviewed -normal SPEP, vitamin A , phosphorus, PTH RP, but elevated calcitriol: Component     Latest Ref Rng 07/22/2020  Total Protein     6.1 - 8.1 g/dL 7.3   Albumin ELP     3.8 - 4.8 g/dL 4.3   Alpha 1     0.2 - 0.3 g/dL 0.3   Alpha 2     0.5 - 0.9 g/dL 0.7   Beta Globulin     0.4 - 0.6 g/dL 0.5   Beta 2     0.2 - 0.5 g/dL 0.4   Gamma Globulin     0.8 - 1.7 g/dL 1.1   SPE Interp. --   Vitamin D  1, 25 (OH) Total     18 - 72 pg/mL 74 (H)   Vitamin D3 1, 25 (OH)  pg/mL 54   Vitamin D2 1, 25 (OH)     pg/mL 20   Vitamin A  (Retinoic Acid)     38 - 98 mcg/dL 43   PTH-Related Protein (PTH-RP)     11 - 20 pg/mL 13   Phosphorus     2.3 - 4.6 mg/dL 3.1     Component     Latest Ref Rng 09/03/2022  Vitamin D , 25-Hydroxy     30.0 - 100.0 ng/mL 54.9   PTH, Intact     15 - 65 pg/mL 152 (H)   Calcium  Ionized     4.7 - 5.5 mg/dL 5.6 (H)   Magnesium     1.6 - 2.3 mg/dL 2.5 (H)    Component     Latest Ref Rng 09/06/2022  Creatinine, 24H Ur     0.50 - 2.15 g/24 h 1.22   Calcium , 24H Urine     mg/24 h 142   FECa 0.0067, which is lower than 0.01.   Component     Latest Ref Rng 09/29/2022  Creatinine, 24H Ur      0.50 - 2.15 g/24 h 1.12   Calcium , 24H Urine     mg/24 h 652 (H)   FECa 0.034.  I referred her to surgery.  She has a history of osteoporosis: Date L1-L4 T score FN T score 33% distal Radius Ultra distal radius  10/05/2023 [facility] ) N/a RFN: -1.6 LFN: -3.2 (-12.8%*) -1.6 (+7.3%*) -3.1   Date L1-L4 T score FN T score 33% distal Radius Ultra distal radius  02/24/2021 [facility] ) N/a RFN: -2.3 LFN: -2.6 -2.2 -3.2   She was started on Boniva  01/2021 - tolerated well.  She had generalized aches and pains >> stopped ~1 year ago.  Of note, she also has a history of GERD and was on omeprazole.  No h/o Le stones.  No h/o CKD. Last BUN/Cr: Lab Results  Component Value Date   BUN 16 07/11/2023   BUN 8 12/09/2022   CREATININE 0.59 07/11/2023   CREATININE 0.60 12/09/2022   Pt is not on HCTZ.  She has a h/o vitamin D  deficiency. Reviewed vit D levels: Lab Results  Component Value Date   VD25OH 53 07/11/2023   VD25OH 54.9 09/03/2022   VD25OH 46 06/10/2022   VD25OH 43 01/13/2022   VD25OH 50.94 07/23/2021   VD25OH 52.28 01/19/2021   VD25OH 24.92 (L) 07/22/2020   VD25OH 40 04/30/2020   VD25OH 18 (L) 02/05/2020  On vitamin D  5000 units daily.  Pt is not on calcium .  Pt does not have a FH of hypercalcemia, pituitary tumors, thyroid  cancer, or osteoporosis.   Pt. also has a history of cataract, anemia.   ROS: + see HPI  Past Medical History:  Diagnosis Date   Anemia    Arthritis    Cataract    GERD (gastroesophageal reflux disease)    Heart murmur    Hypertension    Past Surgical History:  Procedure Laterality Date   ABDOMINAL HYSTERECTOMY     CATARACT EXTRACTION W/PHACO Left 07/04/2019   Procedure: CATARACT EXTRACTION PHACO AND INTRAOCULAR LENS PLACEMENT (IOC) left  00:43.2  12.4%  5.40;  Surgeon: Danielle Kidney, MD;  Location: [facility];  Service: Ophthalmology;  Laterality: Left;    CATARACT EXTRACTION W/PHACO Right 07/25/2019   Procedure: CATARACT EXTRACTION PHACO AND INTRAOCULAR LENS PLACEMENT (IOC) RIGHT  00:41.9  12.4%  5.18;  Surgeon: Danielle Kidney, MD;  Location: [facility];  Service: Ophthalmology;  Laterality: Right;   CESAREAN SECTION     COLONOSCOPY WITH PROPOFOL  N/A 01/04/2019   Procedure: COLONOSCOPY WITH PROPOFOL ;  Surgeon: Selena Daily, MD;  Location: [facility] END[facility];  Service: Gastroenterology;  Laterality: N/A;   ESOPHAGOGASTRODUODENOSCOPY (EGD) WITH PROPOFOL  N/A 01/04/2019   Procedure: ESOPHAGOGASTRODUODENOSCOPY (EGD) WITH PROPOFOL ;  Surgeon: Selena Daily, MD;  Location: [facility] ENDOSCOPY;  Service: Gastroenterology;  Laterality: N/A;   JOINT REPLACEMENT Left 03/18/2022   total knee replacement by  Dr. Hobart Lulas in [place]   PARATHYROIDECTOMY Right 12/10/2022   Procedure: RIGHT INFERIOR PARATHYROIDECTOMY;  Surgeon: Oralee Billow, MD;  Location: [facility];  Service: General;  Laterality: Right;   Social History   Socioeconomic History   Marital status: Divorced    Spouse name: Not on file   Number of children: 1   Years of education: 12   Highest education level: Associate degree: occupational, Scientist, product/process development, or vocational program  Occupational History   Occupation: stay home mom  Tobacco Use   Smoking status: Never   Smokeless tobacco: Never  Vaping Use   Vaping status: Never Used  Substance and Sexual Activity   Alcohol use: Not Currently   Drug use: Not Currently   Sexual activity: Not Currently  Other Topics Concern   Not on file  Social History Narrative   Pt has 70 year old special needs son that lives at home who is non ambulatory/non verbal and she is primary caregiver.    Social Drivers of Corporate investment banker Strain: Low Risk  (02/16/2024)   Overall Financial Resource Strain (CARDIA)    Difficulty of Paying Living Expenses: Not hard at all  Food Insecurity: No Food Insecurity (02/16/2024)   Hunger Vital  Sign    Worried About Running Out of Food in the Last Year: Never true    Ran Out of Food in the Last Year: Never true  Transportation Needs: No Transportation Needs (02/16/2024)   PRAPARE - Administrator, Civil Service (Medical): No    Lack of Transportation (Non-Medical): No  Physical Activity: Insufficiently Active (02/16/2024)   Exercise Vital Sign    Days of Exercise per Week: 2 days    Minutes of Exercise per Session: 20 min  Stress: No Stress Concern Present (02/16/2024)   [employer] of Occupational Health - Occupational Stress Questionnaire    Feeling of Stress : Not at all  Social Connections: Moderately Isolated (02/16/2024)   Social Connection and Isolation Panel    Frequency of Communication with Friends and Family: More than three times a week    Frequency of Social Gatherings with Friends and Family: Never    Attends Religious Services: Never    Database administrator or Organizations: Yes    Attends Engineer, structural: More than 4 times per year    Marital Status: Divorced  Intimate Partner Violence: Not At Risk (02/16/2024)   Humiliation, Afraid, Rape, and Kick questionnaire    Fear of Current or Ex-Partner: No    Emotionally Abused: No    Physically Abused: No    Sexually Abused: No   Current Outpatient Medications on File Prior to Visit  Medication Sig Dispense Refill   aspirin  EC 81 MG tablet Take 81 mg by mouth at bedtime.     Cholecalciferol (VITAMIN D ) 125 MCG (5000 UT) CAPS Take 5,000 Units by mouth at bedtime.     cyanocobalamin  (VITAMIN B12) 1000 MCG tablet Take 5,000 mcg  by mouth daily.     Glycerin-Hypromellose-PEG 400 (ARTIFICIAL TEARS) 0.2-0.2-1 % SOLN Place 1 drop into both eyes daily as needed (Dry eyes).     HYDROcodone-acetaminophen  (NORCO) 7.5-325 MG tablet Take 1 tablet by mouth every 6 (six) hours as needed for moderate pain (pain score 4-6). 30 tablet 0   Iron , Ferrous Sulfate , 325 (65 Fe) MG TABS Take 325 mg by mouth  daily. 90 tablet 0   lidocaine  (LIDODERM ) 5 % Place 1 patch onto the skin every 12 (twelve) hours for 5 days. Remove & Discard patch within 12 hours or as directed by MD 10 patch 0   meloxicam (MOBIC) 15 MG tablet Take 1 tablet (15 mg total) by mouth daily for 7 days. 7 tablet 0   OMEPRAZOLE PO Take 1 capsule by mouth at bedtime. (Patient not taking: Reported on 04/20/2024)     rosuvastatin  (CRESTOR ) 10 MG tablet Take 1 tablet (10 mg total) by mouth daily. (Patient not taking: Reported on 04/20/2024) 90 tablet 3   zinc gluconate 50 MG tablet Take 50 mg by mouth daily.     No current facility-administered medications on file prior to visit.   Allergies  Allergen Reactions   Amoxicillin  Rash   Family History  Problem Relation Age of Onset   Hypertension Mother    Hyperlipidemia Mother    Dementia Father    Hypertension Father    AVM Brother    Anxiety disorder Son    Seizures Son    Dementia Paternal Grandfather    Hypertension Brother    Hypercalcemia Neg Hx    PE: There were no vitals taken for this visit. Wt Readings from Last 3 Encounters:  04/16/24 214 lb (97.1 kg)  08/03/23 217 lb 14.4 oz (98.8 kg)  07/11/23 217 lb (98.4 kg)   Constitutional:  in NAD  The physical exam was not performed (virtual visit).  Assessment: 1. Primary hyperparathyroidism  2.  Vitamin D  deficiency  3.  Osteoporosis  Plan: Patient with several instances of elevated calcium , with the highest level being 11.8.  A corresponding intact PTH level was also high, in 208.  Previous investigation for hypercalcemia included a normal vitamin A , PTH RP, and SPEP.  Her calcitriol level was high.  An initial 24-hour urine calcium  yielded a low fractional excretion of calcium , we repeated this and the new fraction was actually normal.  Ionized calcium  was elevated and her PTH was 152.  Since she had osteoporosis, she met criteria for surgery.  She had a low 33% distal radius T-score, which aligned with the  diagnosis of hyperparathyroidism.  I referred her to surgery and she had right inferior parathyroidectomy in 12/2022 by Danielle Le.  This showed 0.781 g enlarged hypercellular parathyroid  tissue, consistent with a parathyroid  adenoma.  Postop labs showed normal calcium  and PTH (60 - [facility] assay) in 12/2022.  She felt much better after the surgery. - In 07/2023, she had a PTH level checked by [facility] and this was slightly higher, at 80, but compared to the previous labs obtained with the same assay, this appeared to be improving. - I plan to check another PTH, ionized calcium , and vitamin D  when she can return to the clinic.  These will be checked with the [facility] lab.  However, if PTH is still elevated, I will likely obtain another PTH with a [facility] assay, which is less likely to detect inactive PTH microfragments - I will see the patient back in 1 year but sooner for  labs  2.  Vitamin D  deficiency - Continues on 5000 units vitamin D  daily - Latest level was normal in 07/2023: Lab Results  Component Value Date   VD25OH 53 07/11/2023  - Will check another level when she can return to the clinic  3.  Osteoporosis -Previously on Boniva , which she stopped since last visit due to generalized muscle aches - We reviewed together the latest bone density results from 10/2023: Significant improvement in T-scores at the level of the 33% distal radius, but also significant decrease in T-score at the level of the left femoral neck and interestingly, she had improvement at the right femoral neck.  The ultra distal radius T-score was approximately stable. - Unfortunately, she had a humeral fracture since last visit, after falling in her driveway on concrete - At this point, we discussed that she absolutely needs medication for her osteoporosis.  - Due to her history of GERD, and intolerance to p.o. bisphosphonates, I would recommend an injectable antiresorptive, like Prolia.  Discussed about benefits and possible  side effects including atypical fractures and osteonecrosis of the jaw.  I explained that these are quite rare, while the benefit is overwhelming, with more than 50% reduction in fracture rate.  She agrees to start this.  Will order it after obtaining the above labs. - plan to obtain another bone density 2 years from the previous  Orders Placed This Encounter  Procedures   Basic Metabolic Panel Without GFR   Parathyroid  hormone, intact (no Ca)   VITAMIN D  25 Hydroxy (Vit-D Deficiency, Fractures)   Emilie Harden, MD PhD [facility] Endocrinology

## 2024-04-23 ENCOUNTER — Telehealth: Payer: Self-pay | Admitting: Orthopedic Surgery

## 2024-04-23 NOTE — Telephone Encounter (Signed)
 Dr. Onesimo pt - spoke w/the pt, she is scheduled for 6/26 at 10:30am, she stated Dr. JAYSON told her someone would reach out to her prior to the appt about having x-rays done and she hasn't heard anything.  She would like a call back.  [phone]

## 2024-04-25 ENCOUNTER — Ambulatory Visit
Admission: RE | Admit: 2024-04-25 | Discharge: 2024-04-25 | Disposition: A | Discharge status: Home / Self Care | Attending: Orthopedic Surgery | Admitting: Orthopedic Surgery

## 2024-04-25 ENCOUNTER — Telehealth: Payer: Self-pay

## 2024-04-25 ENCOUNTER — Ambulatory Visit
Admission: RE | Admit: 2024-04-25 | Discharge: 2024-04-25 | Disposition: A | Discharge status: Home / Self Care | Source: Ambulatory Visit | Attending: Orthopedic Surgery

## 2024-04-25 DIAGNOSIS — G8929 Other chronic pain: Secondary | ICD-10-CM | POA: Insufficient documentation

## 2024-04-25 DIAGNOSIS — M25532 Pain in left wrist: Secondary | ICD-10-CM | POA: Insufficient documentation

## 2024-04-25 DIAGNOSIS — S4292XA Fracture of left shoulder girdle, part unspecified, initial encounter for closed fracture: Secondary | ICD-10-CM | POA: Diagnosis not present

## 2024-04-25 DIAGNOSIS — M25512 Pain in left shoulder: Secondary | ICD-10-CM | POA: Insufficient documentation

## 2024-04-25 DIAGNOSIS — M1812 Unilateral primary osteoarthritis of first carpometacarpal joint, left hand: Secondary | ICD-10-CM | POA: Diagnosis not present

## 2024-04-25 DIAGNOSIS — S52612A Displaced fracture of left ulna styloid process, initial encounter for closed fracture: Secondary | ICD-10-CM | POA: Diagnosis not present

## 2024-04-25 DIAGNOSIS — S42212A Unspecified displaced fracture of surgical neck of left humerus, initial encounter for closed fracture: Secondary | ICD-10-CM | POA: Diagnosis not present

## 2024-04-25 DIAGNOSIS — S52572A Other intraarticular fracture of lower end of left radius, initial encounter for closed fracture: Secondary | ICD-10-CM | POA: Diagnosis not present

## 2024-04-25 NOTE — Telephone Encounter (Signed)
 Called patient and advised her to go to [place] [facility] to have xrays before appointment.  The address was provided and orders are in

## 2024-04-26 ENCOUNTER — Telehealth: Payer: Self-pay | Admitting: Orthopedic Surgery

## 2024-04-26 ENCOUNTER — Other Ambulatory Visit: Payer: Self-pay

## 2024-04-26 ENCOUNTER — Ambulatory Visit: Admitting: Orthopedic Surgery

## 2024-04-26 DIAGNOSIS — S52572G Other intraarticular fracture of lower end of left radius, subsequent encounter for closed fracture with delayed healing: Secondary | ICD-10-CM | POA: Diagnosis not present

## 2024-04-26 DIAGNOSIS — S42292A Other displaced fracture of upper end of left humerus, initial encounter for closed fracture: Secondary | ICD-10-CM | POA: Diagnosis not present

## 2024-04-26 NOTE — Telephone Encounter (Signed)
 Pt called stating she had an appt with Onesimo and want to remind him to please send her refill of hydrocodone  to [facility]. Pt phone number is [phone].

## 2024-04-26 NOTE — Patient Instructions (Addendum)
 Preoperative Instructions  Your surgery will be at [facility], scheduled with Dr Oneil Horde   The hospital will contact you with a preoperative appointment to discuss Anesthesia.    Please bring your medications with you for the appointment.  They will tell you the arrival time and medication instructions when you have your preoperative evaluation.  Do not wear nail polish the day of your surgery and if you take Phentermine you need to stop this medication ONE WEEK prior to your surgery.    If you take an blood thinning medication, we will need to stop this prior to surgery.  Typically, we stop this medicine at least 5 days prior to surgery.  We will need to confirm this with the doctor who prescribes this medication.  If you are taking medications or an injection for diabetes, or for weight management, this medicine will need to be stopped at least 7 days prior to surgery.     Surgery will be scheduled for 7/3 pending authorization by your insurance company.   Pain medicine policy:  Per [facility] clinic policy, our goal is ensure optimal postoperative pain control with a multimodal pain management strategy.   For all [facility] patients, our goal is to wean post-operative narcotic medications by 6 weeks post-operatively.   If this is not possible due to utilization of pain medication prior to surgery, your [facility] doctor will support your acute post-operative pain control for the first 6 weeks postoperatively, with a plan to transition you back to your primary pain team following that.   Maralee will work to ensure a Therapist, occupational.

## 2024-04-27 ENCOUNTER — Encounter: Payer: Self-pay | Admitting: Orthopedic Surgery

## 2024-04-27 ENCOUNTER — Telehealth: Payer: Self-pay | Admitting: Orthopedic Surgery

## 2024-04-27 MED ORDER — HYDROCODONE-ACETAMINOPHEN 5-325 MG PO TABS: 1.0000 | ORAL_TABLET | Freq: Four times a day (QID) | ORAL | 0 refills | Status: DC | PRN

## 2024-04-27 NOTE — H&P (View-Only) (Signed)
 Return Patient Visit  Assessment: Danielle Le is a 68 y.o. female with the following: 1. Other closed intra-articular fracture of distal end of left radius, subsequent encounter 2. Other closed displaced fracture of proximal end of left humerus, subsequent encounter  Plan: [name] Her fell and sustained a fracture of the left proximal humerus, with displacement of the greater tuberosity.  In addition, she has an impacted fracture of the left distal radius.  Radiograph demonstrates stable alignment of the proximal humerus fracture.  However, there has been further subsidence of the distal radius fracture.  Although she has wanted to avoid surgery, I recommended ORIF of the left distal radius fracture.  Will continue to monitor the left shoulder.  Risks and benefits have been discussed.  All questions have been answered.  Will plan to proceed with surgery in 1 week.  Risks and benefits of surgery, including, but not limited to infection, bleeding, persistent pain, damage to surrounding structures, need for further surgery, malunion, nonunion and more severe complications associated with anesthesia were discussed.  All questions have been answered and they have elected to proceed with surgery.   Follow-up: Return for After surgery; DOS 7/3.  Subjective:  Chief Complaint  Patient presents with   Shoulder Injury    Patient fell 04-16-24 and injured her left shoulder and left wrist   she thought she was better but woke up sore     History of Present Illness: Danielle Le is a 68 y.o. female who returns for evaluation of left arm pain.  She fell and injured her left arm, approximately 1 week ago.  I saw her in clinic last week, and she wanted to avoid surgery.  She has been using a sling.  She has remained in a splint.  She has run out of hydrocodone .  No numbness or tingling.    Review of Systems: No fevers or chills No numbness or tingling No chest pain No shortness of breath No bowel or  bladder dysfunction No GI distress No headaches    Objective: There were no vitals taken for this visit.  Physical Exam:  General: Alert and oriented. and No acute distress. Gait: Normal gait.  Left shoulder is in a sling.  Extensive bruising.  Tenderness to palpation.  Sensation intact in the axillary nerve distribution.  Sugar-tong splint is intact.  Fingers are warm and well-perfused.  Sensation to her fingers is intact.  She is able to wiggle her fingers.  IMAGING: I personally ordered and reviewed the following images   X-rays of the left distal radius demonstrates further subsidence.  There has been impaction of the joint line.  Volar fracture fragment has subsided further.  There is radial deviation of the hand.  New Medications:  Meds ordered this encounter  Medications   HYDROcodone -acetaminophen  (NORCO/VICODIN) 5-325 MG tablet    Sig: Take 1 tablet by mouth every 6 (six) hours as needed for moderate pain (pain score 4-6).    Dispense:  20 tablet    Refill:  0      [name], MD  04/27/2024 12:45 PM

## 2024-04-27 NOTE — Progress Notes (Signed)
 Return Patient Visit  Assessment: Danielle Le is a 68 y.o. female with the following: 1. Other closed intra-articular fracture of distal end of left radius, subsequent encounter 2. Other closed displaced fracture of proximal end of left humerus, subsequent encounter  Plan: [name] Her fell and sustained a fracture of the left proximal humerus, with displacement of the greater tuberosity.  In addition, she has an impacted fracture of the left distal radius.  Radiograph demonstrates stable alignment of the proximal humerus fracture.  However, there has been further subsidence of the distal radius fracture.  Although she has wanted to avoid surgery, I recommended ORIF of the left distal radius fracture.  Will continue to monitor the left shoulder.  Risks and benefits have been discussed.  All questions have been answered.  Will plan to proceed with surgery in 1 week.  Risks and benefits of surgery, including, but not limited to infection, bleeding, persistent pain, damage to surrounding structures, need for further surgery, malunion, nonunion and more severe complications associated with anesthesia were discussed.  All questions have been answered and they have elected to proceed with surgery.   Follow-up: Return for After surgery; DOS 7/3.  Subjective:  Chief Complaint  Patient presents with   Shoulder Injury    Patient fell 04-16-24 and injured her left shoulder and left wrist   she thought she was better but woke up sore     History of Present Illness: [name] is a 68 y.o. female who returns for evaluation of left arm pain.  She fell and injured her left arm, approximately 1 week ago.  I saw her in clinic last week, and she wanted to avoid surgery.  She has been using a sling.  She has remained in a splint.  She has run out of hydrocodone .  No numbness or tingling.    Review of Systems: No fevers or chills No numbness or tingling No chest pain No shortness of breath No bowel or  bladder dysfunction No GI distress No headaches    Objective: There were no vitals taken for this visit.  Physical Exam:  General: Alert and oriented. and No acute distress. Gait: Normal gait.  Left shoulder is in a sling.  Extensive bruising.  Tenderness to palpation.  Sensation intact in the axillary nerve distribution.  Sugar-tong splint is intact.  Fingers are warm and well-perfused.  Sensation to her fingers is intact.  She is able to wiggle her fingers.  IMAGING: I personally ordered and reviewed the following images   X-rays of the left distal radius demonstrates further subsidence.  There has been impaction of the joint line.  Volar fracture fragment has subsided further.  There is radial deviation of the hand.  New Medications:  Meds ordered this encounter  Medications   HYDROcodone -acetaminophen  (NORCO/VICODIN) 5-325 MG tablet    Sig: Take 1 tablet by mouth every 6 (six) hours as needed for moderate pain (pain score 4-6).    Dispense:  20 tablet    Refill:  0      [name], MD  04/27/2024 12:45 PM

## 2024-04-27 NOTE — Telephone Encounter (Signed)
 Pt call about an update on the hydrcodon Dr Onesimo said he was going to send in on her last visit. Please send medication and call pt when sent. Pt phone number is [phone].

## 2024-04-30 ENCOUNTER — Other Ambulatory Visit: Payer: Self-pay

## 2024-04-30 ENCOUNTER — Encounter
Admission: RE | Admit: 2024-04-30 | Discharge: 2024-04-30 | Disposition: A | Discharge status: Home / Self Care | Source: Ambulatory Visit | Attending: Orthopedic Surgery | Admitting: Orthopedic Surgery

## 2024-04-30 DIAGNOSIS — I10 Essential (primary) hypertension: Secondary | ICD-10-CM

## 2024-04-30 DIAGNOSIS — Z01812 Encounter for preprocedural laboratory examination: Secondary | ICD-10-CM

## 2024-04-30 DIAGNOSIS — E669 Obesity, unspecified: Secondary | ICD-10-CM

## 2024-04-30 DIAGNOSIS — D509 Iron deficiency anemia, unspecified: Secondary | ICD-10-CM

## 2024-04-30 DIAGNOSIS — Z01810 Encounter for preprocedural cardiovascular examination: Secondary | ICD-10-CM

## 2024-04-30 HISTORY — DX: Hyperparathyroidism, unspecified: E21.3

## 2024-04-30 HISTORY — DX: Obesity, unspecified: E66.9

## 2024-04-30 HISTORY — DX: Iron deficiency anemia, unspecified: D50.9

## 2024-04-30 HISTORY — DX: Vitamin D deficiency, unspecified: E55.9

## 2024-04-30 NOTE — Patient Instructions (Signed)
 Your procedure is scheduled on:05-03-24 Thursday Report to the Registration Desk on the 1st floor of the [facility].Then proceed to the 2nd floor Surgery Desk To find out your arrival time, please call [phone] between 1PM - 3PM on:05-02-24 Wednesday If your arrival time is 6:00 am, do not arrive before that time as the [facility] entrance doors do not open until 6:00 am.  REMEMBER: Instructions that are not followed completely may result in serious medical risk, up to and including death; or upon the discretion of your surgeon and anesthesiologist your surgery may need to be rescheduled.  Do not eat food after midnight the night before surgery.  No gum chewing or hard candies.  You may however, drink CLEAR liquids up to 2 hours before you are scheduled to arrive for your surgery. Do not drink anything within 2 hours of your scheduled arrival time.  Clear liquids include: - water  - apple juice without pulp - gatorade (not RED colors) - black coffee or tea (Do NOT add milk or creamers to the coffee or tea) Do NOT drink anything that is not on this list.  In addition, your doctor has ordered for you to drink the provided:  Ensure Pre-Surgery Clear Carbohydrate Drink  Drinking this carbohydrate drink up to two hours before surgery helps to reduce insulin resistance and improve patient outcomes. Please complete drinking 2 hours before scheduled arrival time.  One week prior to surgery:Stop NOW (04-30-34) Stop Anti-inflammatories (NSAIDS) such as Advil, Aleve, Ibuprofen, Motrin, Naproxen, Naprosyn and Aspirin  based products such as Excedrin, Goody's Powder, BC Powder. Stop ANY OVER THE COUNTER supplements until after surgery (Vitamin D , B 12, Iron , Vitamin D  3-K2, Zinc)  You may however, continue to take Tylenol  if needed for pain up until the day of surgery.  Continue taking all of your other prescription medications up until the day of surgery.  ON THE DAY OF SURGERY ONLY TAKE  THESE MEDICATIONS WITH SIPS OF WATER: -You may take HYDROcodone -acetaminophen  (NORCO/VICODIN) if needed for pain  Stop your 81 mg Aspirin  NOW (04-30-24)  No Alcohol for 24 hours before or after surgery.  No Smoking including e-cigarettes for 24 hours before surgery.  No chewable tobacco products for at least 6 hours before surgery.  No nicotine patches on the day of surgery.  Do not use any recreational drugs for at least a week (preferably 2 weeks) before your surgery.  Please be advised that the combination of cocaine and anesthesia may have negative outcomes, up to and including death. If you test positive for cocaine, your surgery will be cancelled.  On the morning of surgery brush your teeth with toothpaste and water, you may rinse your mouth with mouthwash if you wish. Do not swallow any toothpaste or mouthwash.  Use CHG Soap wipes as directed on instruction sheet.  Do not wear jewelry, make-up, hairpins, clips or nail polish.  For welded (permanent) jewelry: bracelets, anklets, waist bands, etc.  Please have this removed prior to surgery.  If it is not removed, there is a chance that hospital personnel will need to cut it off on the day of surgery.  Do not wear lotions, powders, or perfumes.   Do not shave body hair from the neck down 48 hours before surgery.  Contact lenses, hearing aids and dentures may not be worn into surgery.  Do not bring valuables to the hospital. [facility] is not responsible for any missing/lost belongings or valuables.   Notify your doctor if  there is any change in your medical condition (cold, fever, infection).  Wear comfortable clothing (specific to your surgery type) to the hospital.  After surgery, you can help prevent lung complications by doing breathing exercises.  Take deep breaths and cough every 1-2 hours. Your doctor may order a device called an Incentive Spirometer to help you take deep breaths. When coughing or sneezing, hold a  pillow firmly against your incision with both hands. This is called "splinting." Doing this helps protect your incision. It also decreases belly discomfort.  If you are being admitted to the hospital overnight, leave your suitcase in the car. After surgery it may be brought to your room.  In case of increased patient census, it may be necessary for you, the patient, to continue your postoperative care in the Same Day Surgery department.  If you are being discharged the day of surgery, you will not be allowed to drive home. You will need a responsible individual to drive you home and stay with you for 24 hours after surgery.   If you are taking public transportation, you will need to have a responsible individual with you.  Please call the Pre-admissions Testing Dept. at [phone] if you have any questions about these instructions.  Surgery Visitation Policy:  Patients having surgery or a procedure may have two visitors.  Children under the age of 34 must have an adult with them who is not the patient.  Preparing the Skin Before Surgery     To help prevent the risk of infection at your surgical site, we are now providing you with rinse-free Sage 2% Chlorhexidine  Gluconate (CHG) disposable wipes.  Chlorhexidine  Gluconate (CHG) Soap  o An antiseptic cleaner that kills germs and bonds with the skin to continue killing germs even after washing  o Used for showering the night before surgery and morning of surgery  The night before surgery: Shower or bathe with warm water. Do not apply perfume, lotions, powders. Wait one hour after shower. Skin should be dry and cool. Open Sage wipe package - use 6 disposable cloths. Wipe body using one cloth for the right arm, one cloth for the left arm, one cloth for the right leg, one cloth for the left leg, one cloth for the chest/abdomen area, and one cloth for the back. Do not use on open wounds or sores. Do not use on face or genitals  (private parts). If you are breast feeding, do not use on breasts. 5. Do not rinse, allow to dry. 6. Skin may feel tacky for several minutes. 7. Dress in clean clothes. 8. Place clean sheets on your bed and do not sleep with pets.  REPEAT ABOVE ON THE MORNING OF SURGERY BEFORE ARRIVING TO THE HOSPITAL.   Merchandiser, retail to address health-related social needs:  [email or web address]

## 2024-05-01 ENCOUNTER — Encounter
Admission: RE | Admit: 2024-05-01 | Discharge: 2024-05-01 | Disposition: A | Discharge status: Home / Self Care | Source: Ambulatory Visit | Attending: Orthopedic Surgery | Admitting: Orthopedic Surgery

## 2024-05-01 DIAGNOSIS — Z01812 Encounter for preprocedural laboratory examination: Secondary | ICD-10-CM

## 2024-05-01 DIAGNOSIS — Z01818 Encounter for other preprocedural examination: Secondary | ICD-10-CM | POA: Diagnosis not present

## 2024-05-01 DIAGNOSIS — D509 Iron deficiency anemia, unspecified: Secondary | ICD-10-CM | POA: Insufficient documentation

## 2024-05-01 DIAGNOSIS — Z01810 Encounter for preprocedural cardiovascular examination: Secondary | ICD-10-CM

## 2024-05-01 DIAGNOSIS — I10 Essential (primary) hypertension: Secondary | ICD-10-CM | POA: Insufficient documentation

## 2024-05-01 DIAGNOSIS — R9431 Abnormal electrocardiogram [ECG] [EKG]: Secondary | ICD-10-CM | POA: Diagnosis not present

## 2024-05-01 DIAGNOSIS — E669 Obesity, unspecified: Secondary | ICD-10-CM | POA: Insufficient documentation

## 2024-05-01 LAB — BASIC METABOLIC PANEL WITH GFR
Anion gap: 8 (ref 5–15)
BUN: 13 mg/dL (ref 8–23)
CO2: 26 mmol/L (ref 22–32)
Calcium: 9.9 mg/dL (ref 8.9–10.3)
Chloride: 105 mmol/L (ref 98–111)
Creatinine, Ser: 0.52 mg/dL (ref 0.44–1.00)
GFR, Estimated: >60 mL/min (ref >60)
Glucose, Bld: 116 mg/dL — ABNORMAL HIGH (ref 70–99)
Potassium: 3.8 mmol/L (ref 3.5–5.1)
Sodium: 139 mmol/L (ref 135–145)

## 2024-05-01 LAB — CBC
HCT: 33.9 % — ABNORMAL LOW (ref 36.0–46.0)
Hemoglobin: 10.5 g/dL — ABNORMAL LOW (ref 12.0–15.0)
MCH: 25.1 pg — ABNORMAL LOW (ref 26.0–34.0)
MCHC: 31 g/dL (ref 30.0–36.0)
MCV: 80.9 fL (ref 80.0–100.0)
Platelets: 357 10*3/uL (ref 150–400)
RBC: 4.19 MIL/uL (ref 3.87–5.11)
RDW: 17.4 % — ABNORMAL HIGH (ref 11.5–15.5)
WBC: 6 10*3/uL (ref 4.0–10.5)
nRBC: 0 % (ref 0.0–0.2)

## 2024-05-02 MED ORDER — LACTATED RINGERS IV SOLN: INTRAVENOUS | Status: DC

## 2024-05-02 MED ORDER — ORAL CARE MOUTH RINSE: 15.0000 mL | Freq: Once | OROMUCOSAL | Status: AC

## 2024-05-02 MED ORDER — CEFAZOLIN SODIUM-DEXTROSE 2-4 GM/100ML-% IV SOLN
2.0000 g | INTRAVENOUS | Status: AC
  Administered 2024-05-03: 2 g via INTRAVENOUS

## 2024-05-02 MED ORDER — CHLORHEXIDINE GLUCONATE 0.12 % MT SOLN
15.0000 mL | Freq: Once | OROMUCOSAL | Status: AC
Start: 2024-05-02 — End: 2024-05-03
  Administered 2024-05-03: 15 mL via OROMUCOSAL

## 2024-05-03 ENCOUNTER — Ambulatory Visit
Admission: RE | Admit: 2024-05-03 | Discharge: 2024-05-03 | Disposition: A | Discharge status: Home / Self Care | Attending: Orthopedic Surgery | Admitting: Orthopedic Surgery

## 2024-05-03 ENCOUNTER — Ambulatory Visit

## 2024-05-03 ENCOUNTER — Encounter: Payer: Self-pay | Admitting: Anesthesiology

## 2024-05-03 ENCOUNTER — Other Ambulatory Visit: Payer: Self-pay

## 2024-05-03 ENCOUNTER — Encounter: Payer: Self-pay | Admitting: Urgent Care

## 2024-05-03 ENCOUNTER — Encounter
Admission: RE | Disposition: A | Discharge status: Home / Self Care | Payer: Self-pay | Source: Home / Self Care | Attending: Orthopedic Surgery

## 2024-05-03 ENCOUNTER — Encounter: Payer: Self-pay | Admitting: Orthopedic Surgery

## 2024-05-03 DIAGNOSIS — S52512A Displaced fracture of left radial styloid process, initial encounter for closed fracture: Secondary | ICD-10-CM | POA: Diagnosis not present

## 2024-05-03 DIAGNOSIS — W19XXXA Unspecified fall, initial encounter: Secondary | ICD-10-CM | POA: Diagnosis not present

## 2024-05-03 DIAGNOSIS — S52572A Other intraarticular fracture of lower end of left radius, initial encounter for closed fracture: Secondary | ICD-10-CM | POA: Diagnosis not present

## 2024-05-03 DIAGNOSIS — G8918 Other acute postprocedural pain: Secondary | ICD-10-CM | POA: Diagnosis not present

## 2024-05-03 DIAGNOSIS — I10 Essential (primary) hypertension: Secondary | ICD-10-CM | POA: Insufficient documentation

## 2024-05-03 DIAGNOSIS — S52502A Unspecified fracture of the lower end of left radius, initial encounter for closed fracture: Secondary | ICD-10-CM | POA: Diagnosis not present

## 2024-05-03 DIAGNOSIS — S52572G Other intraarticular fracture of lower end of left radius, subsequent encounter for closed fracture with delayed healing: Secondary | ICD-10-CM | POA: Diagnosis not present

## 2024-05-03 DIAGNOSIS — S52612A Displaced fracture of left ulna styloid process, initial encounter for closed fracture: Secondary | ICD-10-CM | POA: Diagnosis not present

## 2024-05-03 DIAGNOSIS — S42292A Other displaced fracture of upper end of left humerus, initial encounter for closed fracture: Secondary | ICD-10-CM | POA: Insufficient documentation

## 2024-05-03 DIAGNOSIS — S42352A Displaced comminuted fracture of shaft of humerus, left arm, initial encounter for closed fracture: Secondary | ICD-10-CM | POA: Diagnosis not present

## 2024-05-03 HISTORY — PX: OPEN REDUCTION INTERNAL FIXATION (ORIF) DISTAL RADIAL FRACTURE: SHX5989

## 2024-05-03 SURGERY — OPEN REDUCTION INTERNAL FIXATION (ORIF) DISTAL RADIUS FRACTURE
Anesthesia: General | Site: Wrist | Laterality: Left

## 2024-05-03 MED ORDER — CEFAZOLIN SODIUM-DEXTROSE 2-4 GM/100ML-% IV SOLN
INTRAVENOUS | Status: AC
  Filled 2024-05-03: qty 100

## 2024-05-03 MED ORDER — BUPIVACAINE HCL (PF) 0.5 % IJ SOLN
INTRAMUSCULAR | Status: DC | PRN
Start: 2024-05-03 — End: 2024-05-03
  Administered 2024-05-03: 20 mL via PERINEURAL
  Administered 2024-05-03: 10 mL via PERINEURAL

## 2024-05-03 MED ORDER — OXYCODONE HCL 5 MG/5ML PO SOLN: 5.0000 mg | Freq: Once | ORAL | Status: DC | PRN

## 2024-05-03 MED ORDER — PROPOFOL 10 MG/ML IV BOLUS
INTRAVENOUS | Status: AC
  Filled 2024-05-03: qty 40

## 2024-05-03 MED ORDER — EPHEDRINE 5 MG/ML INJ
INTRAVENOUS | Status: AC
  Filled 2024-05-03: qty 5

## 2024-05-03 MED ORDER — PROPOFOL 10 MG/ML IV BOLUS
INTRAVENOUS | Status: DC | PRN
  Administered 2024-05-03: 30 mg via INTRAVENOUS
  Administered 2024-05-03: 150 mg via INTRAVENOUS
  Administered 2024-05-03: 100 ug/kg/min via INTRAVENOUS
  Administered 2024-05-03 (×2): 20 mg via INTRAVENOUS

## 2024-05-03 MED ORDER — FENTANYL CITRATE (PF) 100 MCG/2ML IJ SOLN
INTRAMUSCULAR | Status: AC
  Filled 2024-05-03: qty 2

## 2024-05-03 MED ORDER — OXYCODONE HCL 5 MG PO TABS: 5.0000 mg | ORAL_TABLET | Freq: Once | ORAL | Status: DC | PRN

## 2024-05-03 MED ORDER — OXYCODONE HCL 5 MG PO TABS: 5.0000 mg | ORAL_TABLET | ORAL | 0 refills | Status: AC | PRN

## 2024-05-03 MED ORDER — PROPOFOL 1000 MG/100ML IV EMUL
INTRAVENOUS | Status: AC
  Filled 2024-05-03: qty 100

## 2024-05-03 MED ORDER — SODIUM CHLORIDE 0.9 % IR SOLN
Status: DC | PRN
Start: 2024-05-03 — End: 2024-05-03
  Administered 2024-05-03: 500 mL

## 2024-05-03 MED ORDER — KETAMINE HCL 50 MG/5ML IJ SOSY
PREFILLED_SYRINGE | INTRAMUSCULAR | Status: DC | PRN
  Administered 2024-05-03 (×2): 20 mg via INTRAVENOUS

## 2024-05-03 MED ORDER — ACETAMINOPHEN 10 MG/ML IV SOLN
INTRAVENOUS | Status: AC
  Filled 2024-05-03: qty 100

## 2024-05-03 MED ORDER — FENTANYL CITRATE PF 50 MCG/ML IJ SOSY
50.0000 ug | PREFILLED_SYRINGE | Freq: Once | INTRAMUSCULAR | Status: AC
  Administered 2024-05-03: 50 ug via INTRAVENOUS

## 2024-05-03 MED ORDER — DEXAMETHASONE SODIUM PHOSPHATE 10 MG/ML IJ SOLN
INTRAMUSCULAR | Status: AC
  Filled 2024-05-03: qty 1

## 2024-05-03 MED ORDER — DEXAMETHASONE SODIUM PHOSPHATE 10 MG/ML IJ SOLN
INTRAMUSCULAR | Status: DC | PRN
  Administered 2024-05-03: 5 mg via INTRAVENOUS

## 2024-05-03 MED ORDER — BUPIVACAINE-EPINEPHRINE (PF) 0.5% -1:200000 IJ SOLN
INTRAMUSCULAR | Status: AC
  Filled 2024-05-03: qty 30

## 2024-05-03 MED ORDER — CELECOXIB 100 MG PO CAPS: 100.0000 mg | ORAL_CAPSULE | Freq: Every day | ORAL | 0 refills | Status: AC

## 2024-05-03 MED ORDER — FENTANYL CITRATE (PF) 100 MCG/2ML IJ SOLN: 25.0000 ug | INTRAMUSCULAR | Status: DC | PRN

## 2024-05-03 MED ORDER — LIDOCAINE HCL (PF) 2 % IJ SOLN
INTRAMUSCULAR | Status: AC
  Filled 2024-05-03: qty 5

## 2024-05-03 MED ORDER — PHENYLEPHRINE 80 MCG/ML (10ML) SYRINGE FOR IV PUSH (FOR BLOOD PRESSURE SUPPORT)
PREFILLED_SYRINGE | INTRAVENOUS | Status: AC
  Filled 2024-05-03: qty 10

## 2024-05-03 MED ORDER — BUPIVACAINE HCL (PF) 0.5 % IJ SOLN
INTRAMUSCULAR | Status: AC
Start: 2024-05-03 — End: 2024-05-03
  Filled 2024-05-03: qty 30

## 2024-05-03 MED ORDER — ACETAMINOPHEN 500 MG PO TABS: 1000.0000 mg | ORAL_TABLET | Freq: Four times a day (QID) | ORAL | 2 refills | Status: AC | PRN

## 2024-05-03 MED ORDER — LIDOCAINE HCL (PF) 1 % IJ SOLN
INTRAMUSCULAR | Status: DC | PRN
  Administered 2024-05-03: 3 mL via SUBCUTANEOUS

## 2024-05-03 MED ORDER — KETAMINE HCL 50 MG/5ML IJ SOSY
PREFILLED_SYRINGE | INTRAMUSCULAR | Status: AC
  Filled 2024-05-03: qty 5

## 2024-05-03 MED ORDER — ONDANSETRON HCL 4 MG/2ML IJ SOLN
INTRAMUSCULAR | Status: AC
  Filled 2024-05-03: qty 2

## 2024-05-03 MED ORDER — ONDANSETRON HCL 4 MG PO TABS: 4.0000 mg | ORAL_TABLET | Freq: Three times a day (TID) | ORAL | 0 refills | Status: AC | PRN

## 2024-05-03 MED ORDER — FENTANYL CITRATE (PF) 100 MCG/2ML IJ SOLN: INTRAMUSCULAR | Status: DC | PRN

## 2024-05-03 MED ORDER — FENTANYL CITRATE PF 50 MCG/ML IJ SOSY
PREFILLED_SYRINGE | INTRAMUSCULAR | Status: AC
  Filled 2024-05-03: qty 1

## 2024-05-03 MED ORDER — CHLORHEXIDINE GLUCONATE 0.12 % MT SOLN
OROMUCOSAL | Status: AC
  Filled 2024-05-03: qty 15

## 2024-05-03 MED ORDER — ACETAMINOPHEN 10 MG/ML IV SOLN
INTRAVENOUS | Status: DC | PRN
  Administered 2024-05-03: 1000 mg via INTRAVENOUS

## 2024-05-03 MED ORDER — FENTANYL CITRATE (PF) 100 MCG/2ML IJ SOLN
INTRAMUSCULAR | Status: DC | PRN
  Administered 2024-05-03 (×2): 25 ug via INTRAVENOUS
  Administered 2024-05-03: 50 ug via INTRAVENOUS

## 2024-05-03 MED ORDER — ROCURONIUM BROMIDE 10 MG/ML (PF) SYRINGE
PREFILLED_SYRINGE | INTRAVENOUS | Status: AC
  Filled 2024-05-03: qty 10

## 2024-05-03 MED ORDER — LIDOCAINE HCL (PF) 1 % IJ SOLN
INTRAMUSCULAR | Status: AC
  Filled 2024-05-03: qty 5

## 2024-05-03 MED ORDER — MIDAZOLAM HCL 2 MG/2ML IJ SOLN
1.0000 mg | INTRAMUSCULAR | Status: DC | PRN
  Administered 2024-05-03: 1 mg via INTRAVENOUS

## 2024-05-03 MED ORDER — ONDANSETRON HCL 4 MG/2ML IJ SOLN
INTRAMUSCULAR | Status: DC | PRN
  Administered 2024-05-03: 4 mg via INTRAVENOUS

## 2024-05-03 MED ORDER — BUPIVACAINE-EPINEPHRINE (PF) 0.5% -1:200000 IJ SOLN
INTRAMUSCULAR | Status: DC | PRN
  Administered 2024-05-03: 10 mL

## 2024-05-03 MED ORDER — MIDAZOLAM HCL 2 MG/2ML IJ SOLN
INTRAMUSCULAR | Status: AC
  Filled 2024-05-03: qty 2

## 2024-05-03 SURGICAL SUPPLY — 43 items
BIT DRILL 1.7 (BIT) IMPLANT
BIT DRILL 2.5 CANN REUSE (DRILL) IMPLANT
BLADE SURG 15 STRL LF DISP TIS (BLADE) ×1 IMPLANT
BNDG COHESIVE 4X5 WHT NS (GAUZE/BANDAGES/DRESSINGS) ×1 IMPLANT
BNDG ELASTIC 3X5.8 VLCR NS LF (GAUZE/BANDAGES/DRESSINGS) IMPLANT
BNDG ESMARCH 4X12 STRL LF (GAUZE/BANDAGES/DRESSINGS) ×1 IMPLANT
CHLORAPREP W/TINT 26 (MISCELLANEOUS) ×1 IMPLANT
CORD BIP STRL DISP 12FT (MISCELLANEOUS) IMPLANT
COVER LIGHT HANDLE STERIS (MISCELLANEOUS) ×2 IMPLANT
CUFF TOURN SGL QUICK 18X4 (TOURNIQUET CUFF) ×1 IMPLANT
DRAPE C-ARMOR (DRAPES) IMPLANT
DRAPE FLUOR MINI C-ARM 54X84 (DRAPES) IMPLANT
ELECTRODE REM PT RTRN 9FT ADLT (ELECTROSURGICAL) ×1 IMPLANT
GAUZE SPONGE 4X4 12PLY STRL (GAUZE/BANDAGES/DRESSINGS) ×1 IMPLANT
GAUZE XEROFORM 1X8 LF (GAUZE/BANDAGES/DRESSINGS) IMPLANT
GLOVE BIO SURGEON STRL SZ8 (GLOVE) ×3 IMPLANT
GLOVE BIOGEL PI IND STRL 7.0 (GLOVE) ×2 IMPLANT
GLOVE BIOGEL PI IND STRL 8 (GLOVE) ×1 IMPLANT
GOWN STRL REUS W/ TWL LRG LVL3 (GOWN DISPOSABLE) ×2 IMPLANT
GOWN STRL REUS W/ TWL XL LVL3 (GOWN DISPOSABLE) ×1 IMPLANT
GUIDEWIRE 1.35MM (WIRE) IMPLANT
KIT TURNOVER KIT A (KITS) ×1 IMPLANT
MANIFOLD NEPTUNE II (INSTRUMENTS) ×1 IMPLANT
NDL HYPO 21X1.5 SAFETY (NEEDLE) ×1 IMPLANT
NEEDLE HYPO 21X1.5 SAFETY (NEEDLE) ×1 IMPLANT
NS IRRIG 1000ML POUR BTL (IV SOLUTION) ×1 IMPLANT
PACK EXTREMITY ARMC (MISCELLANEOUS) ×1 IMPLANT
PAD ARMBOARD POSITIONER FOAM (MISCELLANEOUS) ×1 IMPLANT
PAD CAST 4YDX4 CTTN HI CHSV (CAST SUPPLIES) ×1 IMPLANT
PLATE DIST STD LT 3H (Plate) IMPLANT
POSITIONER HEAD 8X9X4 ADT (SOFTGOODS) ×1 IMPLANT
SCREW KREULOCK 2.4X20 (Screw) IMPLANT
SCREW LOCK 18X2.4XVA NS LF TI (Screw) IMPLANT
SCREW LOCK COMPR 2.4X16 (Screw) IMPLANT
SCREW LP TI 3.5X14MM (Screw) IMPLANT
SCREW NLOCK 2.4X22 (Screw) IMPLANT
SLEEVE DRILL 1.7 GUIDE (IRRIGATION / IRRIGATOR) IMPLANT
STRIP CLOSURE SKIN 1/2X4 (GAUZE/BANDAGES/DRESSINGS) ×1 IMPLANT
SUT ETHILON 3-0 (SUTURE) IMPLANT
SUT VIC AB 2-0 SH 27XBRD (SUTURE) IMPLANT
SYR 30ML LL (SYRINGE) ×1 IMPLANT
SYR BULB IRRIG 60ML STRL (SYRINGE) ×1 IMPLANT
WATER STERILE IRR 1000ML POUR (IV SOLUTION) ×1 IMPLANT

## 2024-05-03 NOTE — Transfer of Care (Signed)
 Immediate Anesthesia Transfer of Care Note  Patient: Danielle Le  Procedure(s) Performed: OPEN REDUCTION INTERNAL FIXATION (ORIF) DISTAL RADIUS FRACTURE (Left: Wrist)  Patient Location: PACU  Anesthesia Type:General  Level of Consciousness: awake, alert , and oriented  Airway & Oxygen Therapy: Patient Spontanous Breathing and Patient connected to nasal cannula oxygen  Post-op Assessment: Report given to RN and Post -op Vital signs reviewed and stable  Post vital signs: stable  Last Vitals:  Vitals Value Taken Time  BP 135/70 05/03/24 09:32  Temp    Pulse 82 05/03/24 09:35  Resp 12 05/03/24 09:35  SpO2 96 % 05/03/24 09:35  Vitals shown include unfiled device data.  Last Pain:  Vitals:   05/03/24 0617  TempSrc: Temporal  PainSc: 0-No pain         Complications: No notable events documented.

## 2024-05-03 NOTE — Anesthesia Preprocedure Evaluation (Addendum)
 Anesthesia Evaluation  Patient identified by MRN, date of birth, ID band Patient awake    Reviewed: Allergy & Precautions, NPO status , Patient's Chart, lab work & pertinent test results  History of Anesthesia Complications Negative for: history of anesthetic complications  Airway Mallampati: III  TM Distance: <3 FB Neck ROM: full    Dental  (+) Chipped   Pulmonary neg shortness of breath   Pulmonary exam normal        Cardiovascular Exercise Tolerance: Good hypertension, (-) angina (-) Past MI Normal cardiovascular exam     Neuro/Psych negative neurological ROS  negative psych ROS   GI/Hepatic Neg liver ROS,GERD  Controlled,,  Endo/Other  negative endocrine ROS    Renal/GU negative Renal ROS  negative genitourinary   Musculoskeletal   Abdominal   Peds  Hematology negative hematology ROS (+)   Anesthesia Other Findings Past Medical History: No date: Arthritis No date: Cataract No date: GERD (gastroesophageal reflux disease) No date: Heart murmur No date: Hyperparathyroidism (HCC) No date: Hypertension     Comment:  no meds No date: IDA (iron  deficiency anemia) No date: Obesity No date: Vitamin D  deficiency  Past Surgical History: No date: ABDOMINAL HYSTERECTOMY 07/04/2019: CATARACT EXTRACTION W/PHACO; Left     Comment:  Procedure: CATARACT EXTRACTION PHACO AND INTRAOCULAR               LENS PLACEMENT (IOC) left  00:43.2  12.4%  5.40;                Surgeon: Mittie Gaskin, MD;  Location: [facility]               SURGERY CNTR;  Service: Ophthalmology;  Laterality: Left; 07/25/2019: CATARACT EXTRACTION W/PHACO; Right     Comment:  Procedure: CATARACT EXTRACTION PHACO AND INTRAOCULAR               LENS PLACEMENT (IOC) RIGHT  00:41.9  12.4%  5.18;                Surgeon: Mittie Gaskin, MD;  Location: [facility]               SURGERY CNTR;  Service: Ophthalmology;  Laterality:               Right; No  date: CESAREAN SECTION 01/04/2019: COLONOSCOPY WITH PROPOFOL ; N/A     Comment:  Procedure: COLONOSCOPY WITH PROPOFOL ;  Surgeon: Unk Corinn Skiff, MD;  Location: [facility] END[facility];  Service:               Gastroenterology;  Laterality: N/A; 01/04/2019: ESOPHAGOGASTRODUODENOSCOPY (EGD) WITH PROPOFOL ; N/A     Comment:  Procedure: ESOPHAGOGASTRODUODENOSCOPY (EGD) WITH               PROPOFOL ;  Surgeon: Unk Corinn Skiff, MD;  Location:               [facility] ENDOS[facility];  Service: Gastroenterology;  Laterality:               N/A; 03/18/2022: JOINT REPLACEMENT; Left     Comment:  total knee replacement by  Dr. Leora in [facility] 12/10/2022: PARATHYROIDECTOMY; Right     Comment:  Procedure: RIGHT INFERIOR PARATHYROIDECTOMY;  Surgeon:               Eletha Boas, MD;  Location: [facility];  Service: General;  Laterality: Right; No date: TONSILLECTOMY     Comment:  as a child  BMI    Body Mass Index: 39.15 kg/m      Reproductive/Obstetrics negative OB ROS                              Anesthesia Physical Anesthesia Plan  ASA: 2  Anesthesia Plan: General   Post-op Pain Management: Regional block*   Induction: Intravenous  PONV Risk Score and Plan: Propofol  infusion and TIVA  Airway Management Planned: Natural Airway and Nasal Cannula  Additional Equipment:   Intra-op Plan:   Post-operative Plan:   Informed Consent: I have reviewed the patients History and Physical, chart, labs and discussed the procedure including the risks, benefits and alternatives for the proposed anesthesia with the patient or authorized representative who has indicated his/her understanding and acceptance.     Dental Advisory Given  Plan Discussed with: Anesthesiologist, CRNA and Surgeon  Anesthesia Plan Comments: (Patient consented for risks of anesthesia including but not limited to:  - adverse reactions to medications - risk of airway placement if  required - damage to eyes, teeth, lips or other oral mucosa - nerve damage due to positioning  - sore throat or hoarseness - Damage to heart, brain, nerves, lungs, other parts of body or loss of life  Patient voiced understanding and assent.)         Anesthesia Quick Evaluation

## 2024-05-03 NOTE — Discharge Instructions (Addendum)
 Danielle Debord [name], MD MS [facility] [address] [place],  [place]  [address] Phone: [phone] Fax: [phone]   POST-OPERATIVE INSTRUCTIONS - UPPER EXTREMITY   WOUND CARE Please keep splint clean dry and intact until followup.  You may shower on Post-Op Day #2.  You must keep splint dry during this process and may find that a plastic bag taped around the arm or alternatively a towel based bath may be a better option.   If you get your splint wet or if it is damaged please contact our clinic.  EXERCISES Due to your splint being in place you will not be able to bear weight through your extremity.   DO NOT PUT ANY WEIGHT ON YOUR OPERATIVE ARM   REGIONAL ANESTHESIA (NERVE BLOCKS) The anesthesia team may have performed a nerve block for you if safe in the setting of your care.  This is a great tool used to minimize pain.  Typically the block may start wearing off overnight but the long acting medicine may last for 3-4 days.  The nerve block wearing off can be a challenging period but please utilize your as needed pain medications to try and manage this period.    POST-OP MEDICATIONS- Multimodal approach to pain control  In general your pain will be controlled with a combination of substances.  Prescriptions unless otherwise discussed are electronically sent to your pharmacy.  This is a carefully made plan we use to minimize narcotic use.     - Meloxicam OR Celebrex - Anti-inflammatory medication taken on a scheduled basis  - Acetaminophen  - Non-narcotic pain medicine taken on a scheduled basis   - Oxycodone  - This is a strong narcotic, to be used only on an as needed basis for pain.  -  Aspirin  81mg  - This medicine is used to minimize the risk of blood clots after surgery.             -          Zofran  - take as needed for nausea   FOLLOW-UP If you develop a Fever (>101.5), Redness or Drainage from the surgical incision site, please call our office to  arrange for an evaluation. Please call the office to schedule a follow-up appointment for your incision check if you do not already have one, 10-14 days post-operatively.  IF YOU HAVE ANY QUESTIONS, PLEASE FEEL FREE TO CALL OUR OFFICE.  HELPFUL INFORMATION  If you had a block, it will wear off between 8-24 hrs postop typically.  This is period when your pain may go from nearly zero to the pain you would have had postop without the block.  This is an abrupt transition but nothing dangerous is happening.  You may take an extra dose of narcotic when this happens.  You should wean off your narcotic medicines as soon as you are able.  Most patients will be off or using minimal narcotics before their first postop appointment.   Elevating your leg will help with swelling and pain control.  You are encouraged to elevate your leg as much as possible in the first couple of weeks following surgery.  Imagine a drop of water on your toe, and your goal is to get that water back to your heart.  We suggest you use the pain medication the first night prior to going to bed, in order to ease any pain when the anesthesia wears off. You should avoid taking pain medications on an empty stomach  as it will make you nauseous.  Do not drink alcoholic beverages or take illicit drugs when taking pain medications.  In most states it is against the law to drive while you are in a splint or sling.  And certainly against the law to drive while taking narcotics.  You may return to work/school in the next couple of days when you feel up to it.   Pain medication may make you constipated.  Below are a few solutions to try in this order: Decrease the amount of pain medication if you aren't having pain. Drink lots of decaffeinated fluids. Drink prune juice and/or each dried prunes  If the first 3 don't work start with additional solutions Take Colace - an over-the-counter stool softener Take Senokot - an over-the-counter  laxative Take Miralax - a stronger over-the-counter laxative  NARCOTIC MANAGEMENT  Per [facility] clinic policy, our goal is ensure optimal postoperative pain control with a multimodal pain management strategy.   For all [facility] patients, our goal is to wean post-operative narcotic medications by 6 weeks post-operatively.   If this is not possible due to utilization of pain medication prior to surgery, your [facility] doctor will support your acute post-operative pain control for the first 6 weeks postoperatively, with a plan to transition you back to your primary pain team following that.   Danielle Le will work to ensure a Therapist, occupational.

## 2024-05-03 NOTE — Anesthesia Postprocedure Evaluation (Signed)
 Anesthesia Post Note  Patient: Danielle Le  Procedure(s) Performed: OPEN REDUCTION INTERNAL FIXATION (ORIF) DISTAL RADIUS FRACTURE (Left: Wrist)  Patient location during evaluation: PACU Anesthesia Type: General Level of consciousness: awake and alert Pain management: pain level controlled Vital Signs Assessment: post-procedure vital signs reviewed and stable Respiratory status: spontaneous breathing, nonlabored ventilation and respiratory function stable Cardiovascular status: blood pressure returned to baseline and stable Postop Assessment: no apparent nausea or vomiting Anesthetic complications: no   No notable events documented.   Last Vitals:  Vitals:   05/03/24 1000 05/03/24 1015  BP: (!) 140/81 (!) 144/84  Pulse: 68 71  Resp: 13 17  Temp:    SpO2: 96% 93%    Last Pain:  Vitals:   05/03/24 1015  TempSrc:   PainSc: 0-No pain                 [name]

## 2024-05-03 NOTE — Anesthesia Procedure Notes (Signed)
 Procedure Name: LMA Insertion Date/Time: 05/03/2024 7:37 AM  Performed by: Gillermo Spruce I, CRNAPre-anesthesia Checklist: Patient identified, Patient being monitored, Timeout performed, Emergency Drugs available and Suction available Patient Re-evaluated:Patient Re-evaluated prior to induction Oxygen Delivery Method: Circle system utilized Preoxygenation: Pre-oxygenation with 100% oxygen Induction Type: IV induction Ventilation: Mask ventilation without difficulty LMA: LMA inserted LMA Size: 4.0 Tube type: Oral Number of attempts: 1 Placement Confirmation: positive ETCO2 and breath sounds checked- equal and bilateral Tube secured with: Tape Dental Injury: Teeth and Oropharynx as per pre-operative assessment

## 2024-05-03 NOTE — Interval H&P Note (Signed)
 History and Physical Interval Note:  05/03/2024 7:21 AM  Danielle Le Her  has presented today for surgery, with the diagnosis of Left distal radius fracture.  The various methods of treatment have been discussed with the patient and family. After consideration of risks, benefits and other options for treatment, the patient has consented to  Procedure(s): OPEN REDUCTION INTERNAL FIXATION (ORIF) DISTAL RADIUS FRACTURE (Left) as a surgical intervention.  The patient's history has been reviewed, patient examined, no change in status, stable for surgery.  I have reviewed the patient's chart and labs.  Questions were answered to the patient's satisfaction.    Procedure has been discussed.  All questions answered.  She is breathing well with normal rate and rhythm.  Alert and oriented.  Consent has been finalized.    [name]

## 2024-05-03 NOTE — Op Note (Signed)
 Orthopaedic Surgery Operative Note (CSN: [id number])  Danielle Le  Jun 27, 1956 Date of Surgery: 05/03/2024   Diagnoses:  Left distal radius fracture; 3 parts with impaction  Procedure: ORIF of left distal radius fracture    Operative Finding Successful completion of the planned procedure.    Post-Op Diagnosis: Same Surgeons:Primary: Onesimo Oneil LABOR, MD Assistants: N/A Location: [facility] OR ROOM 09 Anesthesia: General with regional anesthesia Antibiotics: Ancef  2 g Tourniquet time:  Total Tourniquet Time Documented: Upper Arm (Left) - 79 minutes Total: Upper Arm (Left) - 79 minutes  Estimated Blood Loss: 50 cc Complications: None Specimens: None  Implants: Implant Name Type Inv. Item Serial No. Manufacturer Lot No. LRB No. Used Action  SCREW LP TI 3.5X14MM - [id number] Screw SCREW LP TI 3.5X14MM  [employer]  Left 3 Implanted  PLATE DIST STD LT 3H - [id number] Plate PLATE DIST STD LT 3H  [employer]  Left 1 Implanted  SCREW KREULOCK 2.4X20 - [id number] Screw SCREW KREULOCK 2.4X20  [employer]  Left 1 Implanted  SCREW NLOCK 2.4X22 - [id number] Screw SCREW NLOCK 2.4X22  [employer]  Left 1 Implanted  SCREW LOCK 18X2.4XVA NS LF TI - [id number] Screw SCREW LOCK 18X2.4XVA NS LF TI  [employer]  Left 3 Implanted  SCREW LOCK COMPR 2.4X16 - [id number] Screw SCREW LOCK COMPR 2.4X16  [employer]  Left 1 Implanted    Indications for Surgery:   Danielle Le is a 68 y.o. female who fell and sustained a left distal radius fracture.  Left wrist was reduced in the ED and we monitored Le injury closely.  The fracture continued to subside and overall alignment was not acceptable.  As a result, I recommended operative fixation of the left wrist.  She also has a greater tuberosity fracture of the left shoulder, which we will continue to monitor.   Benefits and risks of operative and nonoperative management were discussed prior to surgery with the patient and informed consent form was completed.  Specific  risks including infection, need for additional surgery, bleeding, damage to surrounding structures, nonunion, malunion, stiffness, persistent pain, blood clots and more severe complications associated with anesthesia.  She elected to proceed.  Surgical consent was finalized.   Procedure:   The patient was identified properly. Informed consent was obtained and the surgical site was marked. The patient was taken to the OR where general anesthesia was induced.  The patient was positioned supine on a hand table.  The left arm was prepped and draped in the usual sterile fashion.  Timeout was performed before the beginning of the case.  Tourniquet was used for the above duration.  Patient received 2 g of Ancef  prior to making incision.  We made an incision directly overlying the FCR tendon.  We proceeded with a standard volar approach to expose the volar aspect of the distal radius.  We carefully protected the radial artery on the radial forearm and the median nerve in the ulnar aspect of the incision.  The fracture site was identified.  Some callus had formed.  This was debrided with a rongeur and irrigation.  The primary fracture fragments were freed up.  There was a large vertical shear component, we gained full mobility of the distal fragment, and we are able to improve the overall alignment.  In addition, there was an intra-articular splint within the mprimary component that extended to the joint surface.  These fragments were manipulated as one fragment.  Reduction was confirmed under fluoroscopy.  We  then placed a K wire for provisional fixation through the radial styloid.  We are satisfied with the reduction.    The above-stated plate was selected, and provisionally placed on the volar surface of the distal radius.  Overall alignment was confirmed under fluoroscopy.  This was secured with a K wire, as well as a point-to-point clamp.  We then placed a single screw in the oblong hole of the shaft.  This  was secured.  The reduction and placement of the plate was then confirmed under fluoroscopy.  It was adjusted accordingly.  The shaft screw was tightened.  We then placed multiple locking screws in the distal aspect of the plate, including a locking screw into the radial styloid.  We confirmed that the screws were placed appropriately, and not within the joint.  Placement of the plate and overall alignment of the fracture was acceptable.  K wires were removed.  We then proceeded to place multiple locking screws in the distal aspect of the plate.  We then placed additional bicortical screws in the shaft of the plate.  Final fluoroscopic images confirmed anatomic alignment of the distal radius fracture, with restoration of the joint line.  Screws are not within the joint.  Placement of the plate was appropriate.  We irrigated the wound copiously.  We closed the incision in a multilayer fashion with absorbable suture and 3-0 nylon.  Sterile dressing was placed followed by a well padded splint.  Patient was awoken taken to PACU in stable condition.   Post-operative plan:  The patient will be discharged from the PACU once she has recovered.   Nonweightbearing on the upper extremity.  Remain in the sling. DVT prophylaxis not indicated in this ambulatory upper extremity patient without significant risk factors.    Pain control with PRN pain medication preferring oral medicines.   Follow up plan will be scheduled in approximately 10-14 days for incision check and XR.

## 2024-05-03 NOTE — Anesthesia Procedure Notes (Addendum)
 Anesthesia Regional Block: Supraclavicular block   Pre-Anesthetic Checklist: , timeout performed,  Correct Patient, Correct Site, Correct Laterality,  Correct Procedure, Correct Position, site marked,  Risks and benefits discussed,  Surgical consent,  Pre-op evaluation,  At surgeon's request and post-op pain management  Laterality: Upper and Left  Prep: chloraprep       Needles:  Injection technique: Single-shot  Needle Type: Stimiplex     Needle Length: 9cm  Needle Gauge: 22     Additional Needles:   Procedures:,,,, ultrasound used (permanent image in chart),,    Narrative:  Start time: 05/03/2024 7:10 AM End time: 05/03/2024 7:15 AM Injection made incrementally with aspirations every 5 mL.  Performed by: Personally   Additional Notes: Patient has pre existing bruising on left should from her injury  Patient consented for risk and benefits of nerve block including but not limited to nerve damage, Horner's syndrome, failed block, bleeding and infection.  Patient voiced understanding.  Functioning IV was confirmed and monitors were applied.  Timeout done prior to procedure and prior to any sedation being given to the patient.  Patient confirmed procedure site prior to any sedation given to the patient.  A 50mm 22ga Stimuplex needle was used. Sterile prep,hand hygiene and sterile gloves were used.  Minimal sedation used for procedure.  No paresthesia endorsed by patient during the procedure.  Negative aspiration and negative test dose prior to incremental administration of local anesthetic. The patient tolerated the procedure well with no immediate complications.

## 2024-05-07 ENCOUNTER — Encounter: Payer: Self-pay | Admitting: Orthopedic Surgery

## 2024-05-14 ENCOUNTER — Telehealth: Payer: Self-pay

## 2024-05-14 ENCOUNTER — Ambulatory Visit
Admission: RE | Admit: 2024-05-14 | Discharge: 2024-05-14 | Disposition: A | Discharge status: Home / Self Care | Attending: Orthopedic Surgery | Admitting: Orthopedic Surgery

## 2024-05-14 ENCOUNTER — Encounter: Payer: Self-pay | Admitting: Orthopedic Surgery

## 2024-05-14 ENCOUNTER — Ambulatory Visit: Admitting: Orthopedic Surgery

## 2024-05-14 ENCOUNTER — Ambulatory Visit
Admission: RE | Admit: 2024-05-14 | Discharge: 2024-05-14 | Disposition: A | Discharge status: Home / Self Care | Source: Ambulatory Visit | Attending: Orthopedic Surgery | Admitting: Orthopedic Surgery

## 2024-05-14 DIAGNOSIS — S52572D Other intraarticular fracture of lower end of left radius, subsequent encounter for closed fracture with routine healing: Secondary | ICD-10-CM

## 2024-05-14 DIAGNOSIS — S42292D Other displaced fracture of upper end of left humerus, subsequent encounter for fracture with routine healing: Secondary | ICD-10-CM

## 2024-05-14 DIAGNOSIS — M25512 Pain in left shoulder: Secondary | ICD-10-CM | POA: Insufficient documentation

## 2024-05-14 DIAGNOSIS — M25532 Pain in left wrist: Secondary | ICD-10-CM | POA: Diagnosis not present

## 2024-05-14 DIAGNOSIS — G8929 Other chronic pain: Secondary | ICD-10-CM

## 2024-05-14 DIAGNOSIS — M19012 Primary osteoarthritis, left shoulder: Secondary | ICD-10-CM | POA: Diagnosis not present

## 2024-05-14 DIAGNOSIS — M19042 Primary osteoarthritis, left hand: Secondary | ICD-10-CM | POA: Diagnosis not present

## 2024-05-14 DIAGNOSIS — S52502D Unspecified fracture of the lower end of left radius, subsequent encounter for closed fracture with routine healing: Secondary | ICD-10-CM | POA: Diagnosis not present

## 2024-05-14 DIAGNOSIS — S42292A Other displaced fracture of upper end of left humerus, initial encounter for closed fracture: Secondary | ICD-10-CM | POA: Diagnosis not present

## 2024-05-14 NOTE — Progress Notes (Signed)
 Orthopaedic Postop Note  Assessment: Danielle Le is a 69 y.o. female s/p ORIF of left distal radius fracture  Nonoperative treatment of left proximal humerus fracture  DOS: 05/03/2024  Plan: Sutures removed, Steri-Strips placed. Radiographs remain stable. Short arm cast placed today Medications as needed Can initiate pendulum exercises for her left shoulder Okay to remove the sling when in a seated position.  Recommend continued use of the sling when ambulatory. Medication as needed Follow-up in 2 weeks  Cast application - Left short arm cast   Verbal consent was obtained and the correct extremity was identified. A well padded, appropriately molded short arm cast was applied to the Left arm Fingers remained warm and well perfused.   There were no sharp edges Patient tolerated the procedure well Cast care instructions were provided     Follow-up: Return in about 2 weeks (around 05/28/2024). XR at next visit: Left shoulder and left wrist  Subjective:  Chief Complaint  Patient presents with   Routine Post Op    Posst of left wrist  patient says so pain and doing well sutures are removed      History of Present Illness: Danielle Le is a 68 y.o. female who presents following the above stated procedure.  Surgery was approximately 10 days ago.  She is doing well.  Pain is improving.  She continues to have some discomfort in the left shoulder.  She is using the sling.  She notes some itching underneath the surgical dressing.  She is no longer taking pain medication.  No numbness or tingling.  Review of Systems: No fevers or chills No numbness or tingling No Chest Pain No shortness of breath   Objective: There were no vitals taken for this visit.  Physical Exam:  Alert and oriented.  No acute distress.  Volar wrist surgical incision is healing well.  No surrounding erythema or drainage.  Swelling is much better.  Fingers warm well-perfused.  Sensation intact in the  M/U/R nerve distribution.  She is able to wiggle her fingers.  Sensation intact in the axillary nerve distribution.  Bruising improved to the left shoulder.  IMAGING: I personally ordered and reviewed the following images:  X-rays left shoulder were obtained, compared to prior x-rays.  Fracture through the surgical neck with mild angulation and displacement.  Overall unchanged.  X-rays of the left wrist were obtained today.  These are compared to prior x-rays.  Distal radius fracture in stable alignment.  Hardware is intact.  There has been no subsidence.  No hardware failure.  Screws are backing out.   Oneil Danielle Horde, MD 05/14/2024 4:09 PM

## 2024-05-14 NOTE — Patient Instructions (Signed)
 Pendulum   Stand near a wall or a surface that you can hold onto for balance. Bend at the waist and let your left / right arm hang straight down. Use your other arm to support you. Keep your back straight and do not lock your knees. Relax your left / right arm and shoulder muscles, and move your hips and your trunk so your left / right arm swings freely. Your arm should swing because of the motion of your body, not because you are using your arm or shoulder muscles. Keep moving your hips and trunk so your arm swings in the following directions, as told by your health care provider: Side to side. Forward and backward. In clockwise and counterclockwise circles. Continue each motion for 20 seconds, or for as long as told by your health care provider. Slowly return to the starting position.  Repeat 10 times. Complete this exercise daily.    General Cast Instructions  1.  You were placed in a cast in clinic today.  Please keep the cast material clean, dry and intact.  Please do not use anything to itch the under the cast.  If it gets itchy, you can consider taking benadryl, or similar medication.  If the cast material gets wet, place it on a towel and use a hair dryer on a low setting. 2.  Tylenol  or Ibuprofen/Naproxen as needed.   3.  Recommend elevating your extremity as much as possible to help with swelling. 4.  F/u 2 weeks, cast off and repeat XR

## 2024-05-14 NOTE — Telephone Encounter (Signed)
 LVM to have x rays at [id number] and orders are in

## 2024-05-18 DIAGNOSIS — B079 Viral wart, unspecified: Secondary | ICD-10-CM | POA: Diagnosis not present

## 2024-05-18 DIAGNOSIS — L538 Other specified erythematous conditions: Secondary | ICD-10-CM | POA: Diagnosis not present

## 2024-05-18 DIAGNOSIS — R208 Other disturbances of skin sensation: Secondary | ICD-10-CM | POA: Diagnosis not present

## 2024-05-18 DIAGNOSIS — L2989 Other pruritus: Secondary | ICD-10-CM | POA: Diagnosis not present

## 2024-05-18 DIAGNOSIS — D485 Neoplasm of uncertain behavior of skin: Secondary | ICD-10-CM | POA: Diagnosis not present

## 2024-05-29 ENCOUNTER — Telehealth: Payer: Self-pay

## 2024-05-29 DIAGNOSIS — M25532 Pain in left wrist: Secondary | ICD-10-CM

## 2024-05-29 DIAGNOSIS — G8929 Other chronic pain: Secondary | ICD-10-CM

## 2024-05-29 NOTE — Telephone Encounter (Signed)
 LVM for patient to go to [facility] to have xrays and the orders are in

## 2024-05-30 ENCOUNTER — Ambulatory Visit
Admission: RE | Admit: 2024-05-30 | Discharge: 2024-05-30 | Disposition: A | Discharge status: Home / Self Care | Source: Ambulatory Visit | Attending: Orthopedic Surgery

## 2024-05-30 ENCOUNTER — Ambulatory Visit
Admission: RE | Admit: 2024-05-30 | Discharge: 2024-05-30 | Disposition: A | Discharge status: Home / Self Care | Attending: Orthopedic Surgery | Admitting: Orthopedic Surgery

## 2024-05-30 ENCOUNTER — Ambulatory Visit (INDEPENDENT_AMBULATORY_CARE_PROVIDER_SITE_OTHER): Admitting: Orthopedic Surgery

## 2024-05-30 ENCOUNTER — Encounter: Payer: Self-pay | Admitting: Orthopedic Surgery

## 2024-05-30 DIAGNOSIS — G8929 Other chronic pain: Secondary | ICD-10-CM | POA: Diagnosis not present

## 2024-05-30 DIAGNOSIS — S42202D Unspecified fracture of upper end of left humerus, subsequent encounter for fracture with routine healing: Secondary | ICD-10-CM | POA: Diagnosis not present

## 2024-05-30 DIAGNOSIS — M25532 Pain in left wrist: Secondary | ICD-10-CM | POA: Diagnosis not present

## 2024-05-30 DIAGNOSIS — S52572D Other intraarticular fracture of lower end of left radius, subsequent encounter for closed fracture with routine healing: Secondary | ICD-10-CM

## 2024-05-30 DIAGNOSIS — M19012 Primary osteoarthritis, left shoulder: Secondary | ICD-10-CM | POA: Diagnosis not present

## 2024-05-30 DIAGNOSIS — S52502A Unspecified fracture of the lower end of left radius, initial encounter for closed fracture: Secondary | ICD-10-CM | POA: Diagnosis not present

## 2024-05-30 DIAGNOSIS — S42292D Other displaced fracture of upper end of left humerus, subsequent encounter for fracture with routine healing: Secondary | ICD-10-CM

## 2024-05-30 DIAGNOSIS — S52612A Displaced fracture of left ulna styloid process, initial encounter for closed fracture: Secondary | ICD-10-CM | POA: Diagnosis not present

## 2024-05-30 DIAGNOSIS — M25512 Pain in left shoulder: Secondary | ICD-10-CM | POA: Diagnosis not present

## 2024-05-30 NOTE — Patient Instructions (Signed)
 Brace at all times.  OK to remove for motion and hygiene  Start passive motion of the left shoulder, as we demonstrated in clinic today   Pendulum   Stand near a wall or a surface that you can hold onto for balance. Bend at the waist and let your left / right arm hang straight down. Use your other arm to support you. Keep your back straight and do not lock your knees. Relax your left / right arm and shoulder muscles, and move your hips and your trunk so your left / right arm swings freely. Your arm should swing because of the motion of your body, not because you are using your arm or shoulder muscles. Keep moving your hips and trunk so your arm swings in the following directions, as told by your health care provider: Side to side. Forward and backward. In clockwise and counterclockwise circles. Continue each motion for 20 seconds, or for as long as told by your health care provider. Slowly return to the starting position.  Repeat 10 times. Complete this exercise daily.

## 2024-05-31 NOTE — Progress Notes (Signed)
 Orthopaedic Postop Note  Assessment: Danielle Le is a 68 y.o. female s/p ORIF of left distal radius fracture  Nonoperative treatment of left proximal humerus fracture  DOS: 05/03/2024  Plan: Danielle Le is recovering appropriately.  Radiographs of the left shoulder, as well as the left wrist remain stable.  Surgical incision is healing appropriately.  We will transition her to a wrist brace.  She is to use the brace at all times for the next 2 weeks, with the exception of hygiene.  She can initiate some gentle range of motion.  This was discussed in clinic.  She can also initiate gentle range of motion of the left shoulder.  Passive motion to start.  Exercises demonstrated.  I would like see her back in 2 weeks with new x-rays.   Follow-up: Return in about 2 weeks (around 06/13/2024). XR at next visit: Left shoulder and left wrist  Subjective:  Chief Complaint  Patient presents with   Wrist Injury    Follow up left wrist  cast off    History of Present Illness: Danielle Le is a 68 y.o. female who presents following the above stated procedure.  Surgery was approximately 1 month ago.  She has done well.  Her pain is much better in her wrist.  Her pain is better in the left shoulder as well.  She is started to come out of the sling.  No numbness or tingling.  No issues with her incision.  No fevers or chills.   Review of Systems: No fevers or chills No numbness or tingling No Chest Pain No shortness of breath   Objective: There were no vitals taken for this visit.  Physical Exam:  Alert and oriented.  No acute distress.  Volar left wrist incision is healing.  No surrounding erythema or drainage.  Sensation is intact throughout the left hand.  There is some mild skin breakdown after removal of the cast.  She tolerates passive forward flexion to 100 degrees.  Passive abduction to 90 degrees.  Sensation intact in the axillary nerve distribution.   IMAGING: I personally ordered  and reviewed the following images:  X-rays of the left shoulder remain unchanged compared to prior x-rays.  Shoulder is reduced.  X-rays of the left wrist demonstrates a well-positioned volar base plate.  Screws are not backing out.  Joint line is neutral.  There is been no subsidence.   [name], MD 05/31/2024 12:15 PM

## 2024-06-14 ENCOUNTER — Encounter: Admitting: Orthopedic Surgery

## 2024-07-10 ENCOUNTER — Other Ambulatory Visit: Payer: Self-pay

## 2024-07-10 DIAGNOSIS — S42292D Other displaced fracture of upper end of left humerus, subsequent encounter for fracture with routine healing: Secondary | ICD-10-CM

## 2024-07-10 DIAGNOSIS — G8929 Other chronic pain: Secondary | ICD-10-CM

## 2024-07-11 ENCOUNTER — Ambulatory Visit
Admission: RE | Admit: 2024-07-11 | Discharge: 2024-07-11 | Disposition: A | Discharge status: Home / Self Care | Source: Ambulatory Visit | Attending: Orthopedic Surgery | Admitting: Orthopedic Surgery

## 2024-07-11 ENCOUNTER — Ambulatory Visit
Admission: RE | Admit: 2024-07-11 | Discharge: 2024-07-11 | Disposition: A | Discharge status: Home / Self Care | Attending: Orthopedic Surgery | Admitting: Orthopedic Surgery

## 2024-07-11 DIAGNOSIS — G8929 Other chronic pain: Secondary | ICD-10-CM | POA: Diagnosis not present

## 2024-07-11 DIAGNOSIS — M19032 Primary osteoarthritis, left wrist: Secondary | ICD-10-CM | POA: Diagnosis not present

## 2024-07-11 DIAGNOSIS — S42292D Other displaced fracture of upper end of left humerus, subsequent encounter for fracture with routine healing: Secondary | ICD-10-CM | POA: Insufficient documentation

## 2024-07-11 DIAGNOSIS — S52612D Displaced fracture of left ulna styloid process, subsequent encounter for closed fracture with routine healing: Secondary | ICD-10-CM | POA: Diagnosis not present

## 2024-07-11 DIAGNOSIS — M25512 Pain in left shoulder: Secondary | ICD-10-CM | POA: Diagnosis not present

## 2024-07-11 DIAGNOSIS — S42352A Displaced comminuted fracture of shaft of humerus, left arm, initial encounter for closed fracture: Secondary | ICD-10-CM | POA: Diagnosis not present

## 2024-07-12 ENCOUNTER — Ambulatory Visit (INDEPENDENT_AMBULATORY_CARE_PROVIDER_SITE_OTHER): Admitting: Orthopedic Surgery

## 2024-07-12 DIAGNOSIS — S42292D Other displaced fracture of upper end of left humerus, subsequent encounter for fracture with routine healing: Secondary | ICD-10-CM

## 2024-07-12 DIAGNOSIS — S52572D Other intraarticular fracture of lower end of left radius, subsequent encounter for closed fracture with routine healing: Secondary | ICD-10-CM

## 2024-07-13 ENCOUNTER — Encounter: Payer: Self-pay | Admitting: Orthopedic Surgery

## 2024-07-13 NOTE — Progress Notes (Signed)
 Orthopaedic Postop Note  Assessment: Danielle Le is a 68 y.o. female s/p ORIF of left distal radius fracture  Nonoperative treatment of left proximal humerus fracture  DOS: 05/03/2024  Plan: Danielle Le is doing well overall.  Her pain is much better.  She notes improved function of the left hand.  She does continue to use her brace.  I have urged her to progressively use the brace the last.  She states understanding.  Radiographs of the left wrist look very good.  She continues to have limitations in her left shoulder.  She is not complaining of pain, but has limited motion.  I do anticipate this will continue to improve, but we discussed and reviewed the x-rays, which demonstrates healing of the fracture, which will lead to decreased function.  Ultimately, if she is unhappy, we can discuss further treatment.  She states understanding.  She will continue to use the left shoulder and wrist as needed.  Continue to work on strength.  I will check on her in 6 weeks.   Follow-up: Return in about 6 weeks (around 08/23/2024). XR at next visit: Left shoulder and left wrist  Subjective:  Chief Complaint  Patient presents with   Postop    ORIF of left distal radius; DOS: 05/03/2024  Nonop treatment of a left proximal humerus fracture    History of Present Illness: Danielle Le is a 68 y.o. female who presents following the above stated procedure.  Surgery was approximately 2 months ago.  She getting better.  She is still using the brace a lot on her left wrist.  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  Regarding the left shoulder, she notes limited function.  Her pain is overall much better.  Review of Systems: No fevers or chills No numbness or tingling No Chest Pain No shortness of breath   Objective: There were no vitals taken for this visit.  Physical Exam:  Alert and oriented.  No acute distress.  Volar left wrist  incision has healed.  No surrounding erythema or drainage.  Sensation is intact throughout the left hand.  60 degree arc of active motion.  She has near full pronation and supination.  Sensation intact in the axillary nerve distribution.   IMAGING: I personally ordered and reviewed the following images:  X-rays of the left wrist demonstrates appropriate positioning of the plate and screws.  Screws not backing out.  There has been interval consolidation.  Left shoulder x-rays remain unchanged.  Proximal humerus fracture, with impaction and posterior displacement of the humeral head fragment.  There has been interval consolidation.  [name], MD 07/13/2024 10:52 AM

## 2024-08-21 ENCOUNTER — Telehealth: Payer: Self-pay | Admitting: Orthopedic Surgery

## 2024-08-21 ENCOUNTER — Other Ambulatory Visit: Payer: Self-pay | Admitting: Family Medicine

## 2024-08-21 DIAGNOSIS — Z1231 Encounter for screening mammogram for malignant neoplasm of breast: Secondary | ICD-10-CM

## 2024-08-21 NOTE — Telephone Encounter (Signed)
 Lvm for pt advising we have xray at the [facility] office now. Can discuss at the appointment

## 2024-08-21 NOTE — Telephone Encounter (Signed)
 Patient called and said if you could order an Xray for the R shoulder as well. CB#[phone]

## 2024-08-23 ENCOUNTER — Ambulatory Visit: Admitting: Orthopedic Surgery

## 2024-08-23 ENCOUNTER — Ambulatory Visit (INDEPENDENT_AMBULATORY_CARE_PROVIDER_SITE_OTHER)

## 2024-08-23 ENCOUNTER — Encounter: Payer: Self-pay | Admitting: Orthopedic Surgery

## 2024-08-23 DIAGNOSIS — S42292D Other displaced fracture of upper end of left humerus, subsequent encounter for fracture with routine healing: Secondary | ICD-10-CM | POA: Diagnosis not present

## 2024-08-23 DIAGNOSIS — S52572D Other intraarticular fracture of lower end of left radius, subsequent encounter for closed fracture with routine healing: Secondary | ICD-10-CM

## 2024-08-23 DIAGNOSIS — S52612D Displaced fracture of left ulna styloid process, subsequent encounter for closed fracture with routine healing: Secondary | ICD-10-CM | POA: Diagnosis not present

## 2024-08-23 DIAGNOSIS — M19032 Primary osteoarthritis, left wrist: Secondary | ICD-10-CM | POA: Diagnosis not present

## 2024-08-23 DIAGNOSIS — S42212D Unspecified displaced fracture of surgical neck of left humerus, subsequent encounter for fracture with routine healing: Secondary | ICD-10-CM | POA: Diagnosis not present

## 2024-08-23 DIAGNOSIS — M8580 Other specified disorders of bone density and structure, unspecified site: Secondary | ICD-10-CM | POA: Diagnosis not present

## 2024-08-23 NOTE — Progress Notes (Signed)
 Orthopaedic Postop Note  Assessment: Danielle Le is a 68 y.o. female s/p ORIF of left distal radius fracture  Nonoperative treatment of left proximal humerus fracture  DOS: 05/03/2024  Plan: Danielle Le continues to improve.  She is pleased with her improvements.  Minimal pain in the left wrist.  She still is lacking some motion, but she states that she is using it all the time.  Radiographs demonstrates well-positioned hardware, without subsidence.  No new injuries.  Encouraged her to continue to work on motion and strengthening, and this will improve her function.  In regards to the left shoulder, she states that she is getting better.  She continues to have improved function, as well as a decrease in pain.  Ultimately, if she is unhappy with the function of the left shoulder, we can consider a reconstruction.  This was discussed in clinic today.  She states understanding.  She will follow-up as needed.   Follow-up: Return if symptoms worsen or fail to improve. XR at next visit: Left shoulder and left wrist  Subjective:  Chief Complaint  Patient presents with   Postop    ORIF of left distal radius fracture; left proximal humerus fracture    History of Present Illness: Danielle Le is a 68 y.o. female who presents following the above stated procedure.  Surgery was approximately 3+ months ago.  The injury was almost 4 months ago.  However, she underwent ORIF greater than 3 months ago.  She has improved.  She has some discomfort in the left wrist, but this is improving.  She also notes improved function of the left shoulder.  Review of Systems: No fevers or chills No numbness or tingling No Chest Pain No shortness of breath   Objective: There were no vitals taken for this visit.  Physical Exam:  Alert and oriented.  No acute distress.  Lower left surgical incision is healed.  No surrounding erythema or drainage.  No swelling.  She has good motion in the wrist, with pain at the  extremes.  4/5 grip strength.  Fingers are warm and well-perfused.  Sensation intact throughout the left hand.  Left shoulder without obvious deformity.  She is able to get a hand over top of her head.  Sensation is intact in the axillary nerve distribution.  Sensation intact throughout left hand.   IMAGING: I personally ordered and reviewed the following images:  X-rays of the left wrist demonstrates appropriate position of a distal radius plate and screws.  No change in alignment.  There are no new fractures.  No displacement.  No subsidence of the implants.  Screws and not backing out.  X-rays of the left shoulder were obtained in clinic today.  These are compared to prior x-rays.  Proximal humerus fracture in stable alignment.  There is some impaction, and posterior translation of the humeral head fragment.  Oneil Danielle Horde, MD 08/23/2024 1:24 PM

## 2024-09-03 ENCOUNTER — Encounter: Payer: Self-pay | Admitting: Radiology

## 2024-10-05 ENCOUNTER — Encounter

## 2024-11-13 ENCOUNTER — Ambulatory Visit
Admission: RE | Admit: 2024-11-13 | Discharge: 2024-11-13 | Disposition: A | Discharge status: Home / Self Care | Source: Ambulatory Visit | Attending: Family Medicine | Admitting: Family Medicine

## 2024-11-13 DIAGNOSIS — Z1231 Encounter for screening mammogram for malignant neoplasm of breast: Secondary | ICD-10-CM | POA: Diagnosis present

## 2024-11-16 ENCOUNTER — Ambulatory Visit: Payer: Self-pay | Admitting: Internal Medicine

## 2025-01-22 ENCOUNTER — Encounter: Admitting: Internal Medicine

## 2025-01-24 ENCOUNTER — Encounter: Admitting: Internal Medicine

## 2025-02-21 ENCOUNTER — Ambulatory Visit: Payer: Self-pay
